# Patient Record
Sex: Female | Born: 1955 | Race: White | Hispanic: No | Marital: Married | Smoking: Never smoker
Health system: Southern US, Community
[De-identification: ages and names within clinical notes are randomized; demographics above are authoritative.]

## PROBLEM LIST (undated history)

## (undated) DIAGNOSIS — S46009A Unspecified injury of muscle(s) and tendon(s) of the rotator cuff of unspecified shoulder, initial encounter: Secondary | ICD-10-CM

## (undated) DIAGNOSIS — E039 Hypothyroidism, unspecified: Secondary | ICD-10-CM

## (undated) DIAGNOSIS — F329 Major depressive disorder, single episode, unspecified: Secondary | ICD-10-CM

## (undated) DIAGNOSIS — N8501 Benign endometrial hyperplasia: Secondary | ICD-10-CM

## (undated) DIAGNOSIS — E119 Type 2 diabetes mellitus without complications: Secondary | ICD-10-CM

## (undated) DIAGNOSIS — C541 Malignant neoplasm of endometrium: Secondary | ICD-10-CM

## (undated) DIAGNOSIS — M1990 Unspecified osteoarthritis, unspecified site: Secondary | ICD-10-CM

## (undated) DIAGNOSIS — B029 Zoster without complications: Secondary | ICD-10-CM

## (undated) DIAGNOSIS — E785 Hyperlipidemia, unspecified: Secondary | ICD-10-CM

## (undated) DIAGNOSIS — F32A Depression, unspecified: Secondary | ICD-10-CM

## (undated) DIAGNOSIS — F419 Anxiety disorder, unspecified: Secondary | ICD-10-CM

## (undated) DIAGNOSIS — R7611 Nonspecific reaction to tuberculin skin test without active tuberculosis: Secondary | ICD-10-CM

## (undated) DIAGNOSIS — Z8619 Personal history of other infectious and parasitic diseases: Secondary | ICD-10-CM

## (undated) HISTORY — DX: Zoster without complications: B02.9

## (undated) HISTORY — DX: Hyperlipidemia, unspecified: E78.5

## (undated) HISTORY — DX: Hypothyroidism, unspecified: E03.9

## (undated) HISTORY — DX: Type 2 diabetes mellitus without complications: E11.9

## (undated) HISTORY — DX: Unspecified osteoarthritis, unspecified site: M19.90

## (undated) HISTORY — PX: FRACTURE SURGERY: SHX138

## (undated) HISTORY — DX: Major depressive disorder, single episode, unspecified: F32.9

## (undated) HISTORY — DX: Personal history of other infectious and parasitic diseases: Z86.19

## (undated) HISTORY — DX: Malignant neoplasm of endometrium: C54.1

## (undated) HISTORY — PX: ABDOMINAL HYSTERECTOMY: SHX81

## (undated) HISTORY — DX: Depression, unspecified: F32.A

## (undated) HISTORY — PX: DILATION AND CURETTAGE OF UTERUS: SHX78

## (undated) HISTORY — DX: Benign endometrial hyperplasia: N85.01

## (undated) HISTORY — DX: Nonspecific reaction to tuberculin skin test without active tuberculosis: R76.11

---

## 1999-03-27 ENCOUNTER — Other Ambulatory Visit: Admission: RE | Admit: 1999-03-27 | Discharge: 1999-03-27 | Payer: Self-pay | Admitting: Family Medicine

## 2000-03-12 ENCOUNTER — Encounter: Payer: Self-pay | Admitting: Family Medicine

## 2000-03-12 ENCOUNTER — Encounter: Admission: RE | Admit: 2000-03-12 | Discharge: 2000-03-12 | Payer: Self-pay | Admitting: Family Medicine

## 2000-05-24 ENCOUNTER — Encounter: Payer: Self-pay | Admitting: Family Medicine

## 2000-05-24 ENCOUNTER — Encounter: Admission: RE | Admit: 2000-05-24 | Discharge: 2000-05-24 | Payer: Self-pay | Admitting: Family Medicine

## 2000-10-09 ENCOUNTER — Encounter: Admission: RE | Admit: 2000-10-09 | Discharge: 2001-01-07 | Payer: Self-pay | Admitting: Family Medicine

## 2001-12-12 ENCOUNTER — Encounter: Payer: Self-pay | Admitting: Family Medicine

## 2001-12-12 ENCOUNTER — Encounter: Admission: RE | Admit: 2001-12-12 | Discharge: 2001-12-12 | Payer: Self-pay | Admitting: Family Medicine

## 2003-04-13 ENCOUNTER — Encounter: Admission: RE | Admit: 2003-04-13 | Discharge: 2003-04-13 | Payer: Self-pay | Admitting: Family Medicine

## 2003-05-04 ENCOUNTER — Encounter (INDEPENDENT_AMBULATORY_CARE_PROVIDER_SITE_OTHER): Payer: Self-pay

## 2003-05-04 ENCOUNTER — Ambulatory Visit (HOSPITAL_COMMUNITY): Admission: RE | Admit: 2003-05-04 | Discharge: 2003-05-04 | Payer: Self-pay | Admitting: Obstetrics and Gynecology

## 2003-05-04 ENCOUNTER — Ambulatory Visit (HOSPITAL_BASED_OUTPATIENT_CLINIC_OR_DEPARTMENT_OTHER): Admission: RE | Admit: 2003-05-04 | Discharge: 2003-05-04 | Payer: Self-pay | Admitting: Obstetrics and Gynecology

## 2003-05-22 DIAGNOSIS — N8501 Benign endometrial hyperplasia: Secondary | ICD-10-CM

## 2003-05-22 HISTORY — DX: Benign endometrial hyperplasia: N85.01

## 2003-11-05 ENCOUNTER — Other Ambulatory Visit: Admission: RE | Admit: 2003-11-05 | Discharge: 2003-11-05 | Payer: Self-pay | Admitting: *Deleted

## 2004-04-17 ENCOUNTER — Encounter: Admission: RE | Admit: 2004-04-17 | Discharge: 2004-04-17 | Payer: Self-pay | Admitting: Family Medicine

## 2004-10-04 ENCOUNTER — Other Ambulatory Visit: Admission: RE | Admit: 2004-10-04 | Discharge: 2004-10-04 | Payer: Self-pay | Admitting: *Deleted

## 2005-05-04 ENCOUNTER — Encounter: Admission: RE | Admit: 2005-05-04 | Discharge: 2005-05-04 | Payer: Self-pay | Admitting: Family Medicine

## 2005-07-26 ENCOUNTER — Encounter: Admission: RE | Admit: 2005-07-26 | Discharge: 2005-10-24 | Payer: Self-pay | Admitting: Family Medicine

## 2005-10-04 ENCOUNTER — Other Ambulatory Visit: Admission: RE | Admit: 2005-10-04 | Discharge: 2005-10-04 | Payer: Self-pay | Admitting: Obstetrics & Gynecology

## 2006-03-27 ENCOUNTER — Encounter: Payer: Self-pay | Admitting: Cardiology

## 2006-05-07 ENCOUNTER — Encounter: Admission: RE | Admit: 2006-05-07 | Discharge: 2006-05-07 | Payer: Self-pay | Admitting: Family Medicine

## 2006-10-29 ENCOUNTER — Other Ambulatory Visit: Admission: RE | Admit: 2006-10-29 | Discharge: 2006-10-29 | Payer: Self-pay | Admitting: Obstetrics & Gynecology

## 2007-05-09 ENCOUNTER — Encounter: Admission: RE | Admit: 2007-05-09 | Discharge: 2007-05-09 | Payer: Self-pay | Admitting: Family Medicine

## 2007-10-30 ENCOUNTER — Other Ambulatory Visit: Admission: RE | Admit: 2007-10-30 | Discharge: 2007-10-30 | Payer: Self-pay | Admitting: Obstetrics & Gynecology

## 2008-05-10 ENCOUNTER — Encounter: Admission: RE | Admit: 2008-05-10 | Discharge: 2008-05-10 | Payer: Self-pay | Admitting: Obstetrics & Gynecology

## 2009-05-11 ENCOUNTER — Encounter: Admission: RE | Admit: 2009-05-11 | Discharge: 2009-05-11 | Payer: Self-pay | Admitting: Obstetrics & Gynecology

## 2009-09-16 ENCOUNTER — Encounter: Payer: Self-pay | Admitting: Cardiology

## 2009-10-11 ENCOUNTER — Ambulatory Visit (HOSPITAL_BASED_OUTPATIENT_CLINIC_OR_DEPARTMENT_OTHER): Admission: RE | Admit: 2009-10-11 | Discharge: 2009-10-11 | Payer: Self-pay | Admitting: Cardiology

## 2009-10-15 ENCOUNTER — Ambulatory Visit: Payer: Self-pay | Admitting: Internal Medicine

## 2010-05-16 ENCOUNTER — Encounter
Admission: RE | Admit: 2010-05-16 | Discharge: 2010-05-16 | Payer: Self-pay | Source: Home / Self Care | Attending: Obstetrics & Gynecology | Admitting: Obstetrics & Gynecology

## 2010-10-06 NOTE — Op Note (Signed)
NAME:  Cheryl Chan, Cheryl Chan NO.:  [id number]   MEDICAL RECORD NO.:  [id number]                   PATIENT TYPE:  AMB   LOCATION:  [facility]                                 FACILITY:  [facility]   PHYSICIAN:  Laqueta Linden, M.D.                 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   PROCEDURE:  Diagnostic hysteroscopy with curettage.   SURGEON:  Laqueta Linden, M.D.   ANESTHESIA:  MAC sedation with paracervical block.   ESTIMATED BLOOD LOSS:  Less than 10 mL.   SORBITOL NET INTAKE:  Less than 100 mL.   COMPLICATIONS:  None.   COMPLICATIONS:  None.   INDICATIONS FOR PROCEDURE:  Cheryl Chan is a 55 year old, gravida 4, para 4  female who has undergone 4 cesarean sections who had menses well controlled  on Micronor discontinued in early 2004.  She recently experienced prolonged  heavy bleeding which decreased on progesterone and then resumed.  Hemoglobin  was as low as 10.9.  Endometrial sampling was performed and revealed focal  complex endometrial hyperplasia without atypia but cannot rule out  adenocarcinoma. She subsequently underwent treatment with Megace 40 mg  b.i.d. for two weeks and experienced a withdrawal bleed and presents now for  outpatient hysteroscopic evaluation with curettage to more thoroughly  evaluate her endometrium and rule out a focus of adenocarcinoma.  She has  seen the informed consent film, voiced her understanding and acceptance of  all risks including the fact that this is intended to be a diagnostic and  not therapeutic procedure and agrees to proceed.   DESCRIPTION OF PROCEDURE:  The patient was taken to the operating room after  receiving 30 mg of IV Toradol.  She was  placed on the operating table in the  supine position and after proper identification and consents were  ascertained, MAC sedation was then introduced. She was placed in the [place]  stirrups and the perineum and vagina were prepped and draped in routine  sterile fashion. The bladder was emptied with a catheter.  Bimanual  examination confirmed a normal size uterus which was high in the pelvis and  anterior and not mobile.  A speculum was placed in the vagina and the  anterior lip of the cervix grasped with a single tooth tenaculum.  Of note,  there was no cervical descent noted.  The endometrial cavity sounded to 10  cm.  The internal os was gently dilated to a number 21 Pratt dilator without  difficulty after placement of a paracervical block utilizing 10 mL of 1%  plain Xylocaine circumferentially.  The diagnostic scope was then inserted.  Both tubal ostia  were visualized. There was a tiny polyp at the area of the  left tubal ostia. The remainder of the endometrial cavity appeared quite  clean and even atrophic appearing.  Of note, the patient had stopped  bleeding or was only spotting at the time of the procedure having undergone  some heavier bleeding after the Megace the week prior to the procedure.  There were no focal lesions identified.  Photographs were taken.  The scope  was withdrawn.  Sharp curettage productive of a minimal amount of tissue was  then performed. This was sent to pathology, attention Marcie Bal, M.D.  to review in view of the patient's prior history.  All instruments were then  removed.  Net sorbitol intake was negligible.  Estimated blood loss  negligible.  Tenaculum site was hemostatic. The patient was stable and awake  on transfer to the recovery room.  She will be observed and discharged per  anesthesia protocol. She received an additional 30 mg of Toradol IM while  she was sedated in the operating room.  She will be discharged home when  appropriate with  routine written and verbal discharge instructions. She is  to followup in the office in two weeks time or sooner for excessive pain,  fever, bleeding or any other concerns.  Pathology will be reviewed with long-  term management plan addressed when results are available.  She is to take  Advil or Aleve as needed for any cramping and to call for excessive  cramping, fever, bleeding or other concerns.                                               Laqueta Linden, M.D.    LKS/MEDQ  D:  05/04/2003  T:  05/04/2003  Job:  [id number]   cc:   Duncan Dull, M.D.  [address]  [place]  [place] [address]  Fax: [phone]

## 2010-10-20 LAB — HM PAP SMEAR: HM Pap smear: NORMAL

## 2010-12-27 ENCOUNTER — Ambulatory Visit: Payer: Self-pay | Admitting: Family Medicine

## 2011-04-27 ENCOUNTER — Other Ambulatory Visit: Payer: Self-pay | Admitting: Obstetrics & Gynecology

## 2011-04-27 DIAGNOSIS — Z1231 Encounter for screening mammogram for malignant neoplasm of breast: Secondary | ICD-10-CM

## 2011-06-01 ENCOUNTER — Ambulatory Visit
Admission: RE | Admit: 2011-06-01 | Discharge: 2011-06-01 | Disposition: A | Discharge status: Home / Self Care | Payer: 59 | Source: Ambulatory Visit | Attending: Obstetrics & Gynecology | Admitting: Obstetrics & Gynecology

## 2011-06-01 DIAGNOSIS — Z1231 Encounter for screening mammogram for malignant neoplasm of breast: Secondary | ICD-10-CM

## 2011-08-20 LAB — HM DIABETES FOOT EXAM: HM Diabetic Foot Exam: NORMAL

## 2011-08-29 ENCOUNTER — Encounter: Payer: Self-pay | Admitting: *Deleted

## 2011-08-30 ENCOUNTER — Telehealth: Payer: Self-pay | Admitting: Internal Medicine

## 2011-08-30 NOTE — Telephone Encounter (Signed)
Message copied by Etheleen Sia on Thu Aug 30, 2011  2:47 PM ------      Message from: Rene Paci A      Created: Wed Aug 22, 2011 12:20 PM      Regarding: RE: NEW PT REQUEST IN SOONER APPT       Yes - but try to avoid more than 3 new pts in one day (esp Mon or Wed!)      ----- Message -----         From: Etheleen Sia         Sent: 08/22/2011   9:27 AM           To: Newt Lukes, MD      Subject: NEW PT REQUEST IN SOONER APPT                            Cheryl Chan has a new pt appt on May 9. She has a conflict with her daughter's schedule.  She also wants to come sooner due to problems with her blood sugar.  Is it ok to work in prior to May 9?

## 2011-09-10 ENCOUNTER — Ambulatory Visit (INDEPENDENT_AMBULATORY_CARE_PROVIDER_SITE_OTHER): Payer: 59 | Admitting: Internal Medicine

## 2011-09-10 ENCOUNTER — Other Ambulatory Visit (INDEPENDENT_AMBULATORY_CARE_PROVIDER_SITE_OTHER): Payer: 59

## 2011-09-10 ENCOUNTER — Encounter: Payer: Self-pay | Admitting: Internal Medicine

## 2011-09-10 VITALS — BP 100/62 | HR 72 | Temp 98.4°F | Ht 62.0 in | Wt 147.0 lb

## 2011-09-10 DIAGNOSIS — E039 Hypothyroidism, unspecified: Secondary | ICD-10-CM | POA: Insufficient documentation

## 2011-09-10 DIAGNOSIS — F329 Major depressive disorder, single episode, unspecified: Secondary | ICD-10-CM | POA: Insufficient documentation

## 2011-09-10 DIAGNOSIS — F32A Depression, unspecified: Secondary | ICD-10-CM | POA: Insufficient documentation

## 2011-09-10 DIAGNOSIS — H6120 Impacted cerumen, unspecified ear: Secondary | ICD-10-CM

## 2011-09-10 DIAGNOSIS — E119 Type 2 diabetes mellitus without complications: Secondary | ICD-10-CM

## 2011-09-10 DIAGNOSIS — H6123 Impacted cerumen, bilateral: Secondary | ICD-10-CM

## 2011-09-10 DIAGNOSIS — E785 Hyperlipidemia, unspecified: Secondary | ICD-10-CM

## 2011-09-10 NOTE — Assessment & Plan Note (Signed)
Hx gestational DM Previously on metformin - none since 2010 Check a1c and monitor Encouraged continued weight control, diet and exercsie No results found for this basename: HGBA1C

## 2011-09-10 NOTE — Patient Instructions (Signed)
It was good to see you today. We have reviewed your prior records including labs and tests today Medications reviewed, no changes at this time. Call as refills are needed Test(s) ordered today. Your results will be called to you after review (48-72hours after test completion). If any changes need to be made, you will be notified at that time. Your ears have been irrigated of wax today -let us know if continued hearing problems persist for referral to audiologist and hearing testing Please schedule followup in 6 months, call sooner if problems.

## 2011-09-10 NOTE — Assessment & Plan Note (Signed)
On Armour Check TSH now and adjust dose if needed The current medical regimen is effective;  continue present plan and medications. No results found for this basename: TSH

## 2011-09-10 NOTE — Assessment & Plan Note (Signed)
Manifest as "pseudodementia" - aw neuro (love) for sme Started on sertraline fall 2012 and much improved - continue same

## 2011-09-10 NOTE — Assessment & Plan Note (Signed)
Intolerant of statins - previous trials of Crestor, lipitor, Vytorin all caused extreme myalgia, fatigue and memory problems controls with diet/exercise and weight control Check annually to monitor same  

## 2011-09-10 NOTE — Progress Notes (Signed)
Subjective:    Patient ID: Cheryl Chan, female    DOB: 02/26/1956, 56 y.o.   MRN: [id number]  HPI  New to me, here to establish PCP Reviewed chronic medical issues:  DM2, diet controlled since 2010 - hx gestational DM requiring insulin x 2 preg, stopped metformin 2010 due to a1c <6. Follows careful diet and exercise program with close attention to weight  Dyslipidemia - intolerant of statins - has tried several in past - also intol of WelChol - follows careful diet/exercise program to control weight to manage same  Hypothyroid - prefers Armour - the patient reports compliance with medication(s) as prescribed. Denies adverse side effects.  Past Medical History  Diagnosis Date  . Diet-controlled type 2 diabetes mellitus   . Hypothyroidism   . Hyperlipidemia   . Arthritis   . 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 History of chicken pox   . 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 Heart disease Mother   . Cancer Father     prostrate  . Hypertension Father   . Arthritis Father    History  Substance Use Topics  . Smoking status: Never Smoker   . Smokeless tobacco: Not on file  . Alcohol Use: 1.2 oz/week    2 Glasses of wine per week     Review of Systems Constitutional: Negative for fever or unexpected weight change.  Respiratory: Negative for cough and shortness of breath.   Cardiovascular: Negative for chest pain or palpitations.  Gastrointestinal: Negative for abdominal pain, no bowel changes.  Musculoskeletal: Negative for gait problem or joint swelling.  Skin: Negative for rash.  Neurological: Negative for dizziness or headache.  No other specific complaints in a complete review of systems (except as listed in HPI above).     Objective:   Physical Exam BP 100/62  Pulse 72  Temp(Src) 98.4 F (36.9 C) (Oral)  Ht 5\' 2"  (1.575 m)  Wt 147 lb (66.679 kg)  BMI 26.89 kg/m2  SpO2 97% Wt Readings from Last 3  Encounters:  09/10/11 147 lb (66.679 kg)   Constitutional: She appears well-developed and well-nourished. No distress.  HENT: Head: Normocephalic and atraumatic. Ears: B cerumen impaction - after irrigation, B TMs ok, no erythema or effusion; Nose: Nose normal. Mouth/Throat: Oropharynx is clear and moist. No oropharyngeal exudate.  Eyes: Conjunctivae and EOM are normal. Pupils are equal, round, and reactive to light. No scleral icterus.  Neck: Normal range of motion. Neck supple. No JVD present. No thyromegaly present.  Cardiovascular: Normal rate, regular rhythm and normal heart sounds.  No murmur heard. No BLE edema. Pulmonary/Chest: Effort normal and breath sounds normal. No respiratory distress. She has no wheezes. Musculoskeletal: Normal range of motion, no joint effusions. No gross deformities Neurological: She is alert and oriented to person, place, and time. No cranial nerve deficit. Coordination normal.  Skin: Skin is warm and dry. No rash noted. No erythema.  Psychiatric: She has a normal mood and affect. Her behavior is normal. Judgment and thought content normal.   No results found for this basename: WBC,  HGB,  HCT,  PLT,  GLUCOSE,  CHOL,  TRIG,  HDL,  LDLDIRECT,  LDLCALC,  ALT,  AST,  NA,  K,  CL,  CREATININE,  BUN,  CO2,  TSH,  PSA,  INR,  GLUF,  HGBA1C,  MICROALBUR      Assessment & Plan:  See problem list. Medications and labs reviewed today.  B cerumen impaction  -  Procedure: wax removal, bilateral Reason: wax impaction Risks and benefits of procedure discussed with the patient who agrees to proceed. Ear(s) irrigated with warm water. Large amount of wax removed. Instrumentation with metal ear loop was performed to accomplish wax removal. the patient tolerated procedure well.

## 2011-09-11 ENCOUNTER — Other Ambulatory Visit: Payer: Self-pay | Admitting: *Deleted

## 2011-09-11 LAB — BASIC METABOLIC PANEL
BUN: 28 mg/dL — ABNORMAL HIGH (ref 6–23)
Calcium: 8.9 mg/dL (ref 8.4–10.5)
GFR: 102.28 mL/min (ref >60.00)
Glucose, Bld: 98 mg/dL (ref 70–99)
Sodium: 139 mEq/L (ref 135–145)

## 2011-09-11 LAB — LIPID PANEL
Cholesterol: 219 mg/dL — ABNORMAL HIGH (ref 0–200)
HDL: 54 mg/dL (ref >39.00)
Total CHOL/HDL Ratio: 4
Triglycerides: 154 mg/dL — ABNORMAL HIGH (ref 0.0–149.0)
VLDL: 30.8 mg/dL (ref 0.0–40.0)

## 2011-09-11 LAB — MICROALBUMIN / CREATININE URINE RATIO
Creatinine,U: 58.7 mg/dL
Microalb Creat Ratio: 0.3 mg/g (ref 0.0–30.0)
Microalb, Ur: 0.2 mg/dL (ref 0.0–1.9)

## 2011-09-11 LAB — LDL CHOLESTEROL, DIRECT: Direct LDL: 146.7 mg/dL

## 2011-09-11 LAB — TSH: TSH: 1.89 u[IU]/mL (ref 0.35–5.50)

## 2011-09-11 LAB — T4, FREE: Free T4: 0.52 ng/dL — ABNORMAL LOW (ref 0.60–1.60)

## 2011-09-11 MED ORDER — THYROID 90 MG PO TABS: 90.0000 mg | ORAL_TABLET | Freq: Every day | ORAL | Status: DC

## 2011-09-27 ENCOUNTER — Ambulatory Visit: Payer: Self-pay | Admitting: Internal Medicine

## 2011-11-12 ENCOUNTER — Telehealth: Payer: Self-pay | Admitting: Internal Medicine

## 2011-11-12 DIAGNOSIS — C55 Malignant neoplasm of uterus, part unspecified: Secondary | ICD-10-CM

## 2011-11-12 DIAGNOSIS — Z8542 Personal history of malignant neoplasm of other parts of uterus: Secondary | ICD-10-CM | POA: Insufficient documentation

## 2011-11-12 NOTE — Telephone Encounter (Signed)
Caller: Denisia/Patient; PCP: Rene Paci; CB#: [phone]; ; ; Call regarding Other; Patient has recently been diagnosed with cancer and is wanting to speak with Dr. Felicity Coyer regarding treatment plan and need to possibly increase dosage of antidepressant medication. Patient is available at the above number outside of business hours at any time.

## 2011-11-12 NOTE — Telephone Encounter (Signed)
Reviewed new dx uterine ca and pending eval ay [facility]  Months of increasing depression symptoms, exac by situational stress - increase sertraline now - pt will call when new rx needed - verified no si/hi - support offered

## 2011-11-15 ENCOUNTER — Other Ambulatory Visit: Payer: Self-pay | Admitting: *Deleted

## 2011-11-15 MED ORDER — SERTRALINE HCL 100 MG PO TABS: 100.0000 mg | ORAL_TABLET | Freq: Every day | ORAL | Status: DC

## 2011-11-15 NOTE — Telephone Encounter (Signed)
MD received fax stating pt currently taking sertraline 50 mg daily, as rx by Dr. Sandria Manly. She is requesting an increase to 100 mg, also want a 3 months supply. Pr md ok to increase. Sending new rx to [place] pharmacy for 3months

## 2011-11-19 LAB — HM PAP SMEAR

## 2011-11-21 ENCOUNTER — Encounter (HOSPITAL_COMMUNITY): Payer: Self-pay | Admitting: Pharmacy Technician

## 2011-11-30 ENCOUNTER — Encounter (HOSPITAL_COMMUNITY): Payer: Self-pay

## 2011-11-30 ENCOUNTER — Ambulatory Visit (HOSPITAL_COMMUNITY)
Admission: RE | Admit: 2011-11-30 | Discharge: 2011-11-30 | Disposition: A | Discharge status: Home / Self Care | Payer: 59 | Source: Ambulatory Visit | Attending: Gynecologic Oncology | Admitting: Gynecologic Oncology

## 2011-11-30 ENCOUNTER — Other Ambulatory Visit: Payer: Self-pay

## 2011-11-30 ENCOUNTER — Encounter (HOSPITAL_COMMUNITY)
Admission: RE | Admit: 2011-11-30 | Discharge: 2011-11-30 | Disposition: A | Discharge status: Home / Self Care | Payer: 59 | Source: Ambulatory Visit | Attending: Gynecologic Oncology | Admitting: Gynecologic Oncology

## 2011-11-30 DIAGNOSIS — Z01810 Encounter for preprocedural cardiovascular examination: Secondary | ICD-10-CM | POA: Insufficient documentation

## 2011-11-30 DIAGNOSIS — Z01812 Encounter for preprocedural laboratory examination: Secondary | ICD-10-CM | POA: Insufficient documentation

## 2011-11-30 DIAGNOSIS — E119 Type 2 diabetes mellitus without complications: Secondary | ICD-10-CM | POA: Insufficient documentation

## 2011-11-30 DIAGNOSIS — I498 Other specified cardiac arrhythmias: Secondary | ICD-10-CM | POA: Insufficient documentation

## 2011-11-30 DIAGNOSIS — C549 Malignant neoplasm of corpus uteri, unspecified: Secondary | ICD-10-CM | POA: Insufficient documentation

## 2011-11-30 DIAGNOSIS — E785 Hyperlipidemia, unspecified: Secondary | ICD-10-CM | POA: Insufficient documentation

## 2011-11-30 DIAGNOSIS — Z01818 Encounter for other preprocedural examination: Secondary | ICD-10-CM | POA: Insufficient documentation

## 2011-11-30 DIAGNOSIS — R011 Cardiac murmur, unspecified: Secondary | ICD-10-CM | POA: Insufficient documentation

## 2011-11-30 LAB — DIFFERENTIAL
Basophils Absolute: 0 10*3/uL (ref 0.0–0.1)
Eosinophils Absolute: 0.1 10*3/uL (ref 0.0–0.7)
Eosinophils Relative: 3 % (ref 0–5)
Lymphs Abs: 1.9 10*3/uL (ref 0.7–4.0)
Monocytes Absolute: 0.5 10*3/uL (ref 0.1–1.0)

## 2011-11-30 LAB — COMPREHENSIVE METABOLIC PANEL
ALT: 13 U/L (ref 0–35)
Alkaline Phosphatase: 71 U/L (ref 39–117)
CO2: 31 mEq/L (ref 19–32)
GFR calc Af Amer: >90 mL/min (ref >90)
GFR calc non Af Amer: >90 mL/min (ref >90)
Glucose, Bld: 112 mg/dL — ABNORMAL HIGH (ref 70–99)
Potassium: 4.5 mEq/L (ref 3.5–5.1)
Sodium: 137 mEq/L (ref 135–145)

## 2011-11-30 LAB — CBC
Hemoglobin: 12.6 g/dL (ref 12.0–15.0)
RBC: 4.26 MIL/uL (ref 3.87–5.11)
WBC: 4.7 10*3/uL (ref 4.0–10.5)

## 2011-11-30 LAB — SURGICAL PCR SCREEN: Staphylococcus aureus: POSITIVE — AB

## 2011-11-30 NOTE — Patient Instructions (Signed)
20 Cheryl Chan  11/30/2011   Your procedure is scheduled on:  12/04/11   Tuesday   Surgery  [id number]  Report to [facility] at   0515    AM.  Call this number if you have problems the morning of surgery: [phone]     Or PST   [phone]  [facility]   Remember: CALL OFFICE EARLY Monday MORNING REGARDING BOWEL PREP  Do not eat food:After Midnight.  Sunday NIGHT  OR AS DIRECTED BY OFFICE  May have clear liquids: all day Monday  Until midnight Monday night then none  Clear liquids include soda, tea, black coffee, apple or grape juice, broth.  Take these medicines the morning of surgery with A SIP OF WATER:SERTALINE, ARMOUR THYROID   Do not wear jewelry, make-up or nail polish.  Do not wear lotions, powders, or perfumes. You may wear deodorant.  Do not shave 48 hours prior to surgery.  Do not bring valuables to the hospital.  Contacts, dentures or bridgework may not be worn into surgery.  Leave suitcase in the car. After surgery it may be brought to your room.  For patients admitted to the hospital, checkout time is 11:00 AM the day of discharge.   Patients discharged the day of surgery will not be allowed to drive home.  Name and phone number of your driver:    husband                                                                  Special Instructions: CHG Shower Use Special Wash: 1/2 bottle night before surgery and 1/2 bottle morning of surgery. REGULAR SOAP FACE AND PRIVATES              LADIES- NO SHAVING 48 HOURS BEFORE USING BETASEPT SOAP.               Please read over the following fact sheets that you were given: MRSA Information

## 2011-12-03 MED ORDER — DEXTROSE 5 % IV SOLN
2.0000 g | INTRAVENOUS | Status: AC
  Administered 2011-12-04: 2 g via INTRAVENOUS
  Filled 2011-12-03 (×2): qty 2

## 2011-12-04 ENCOUNTER — Encounter (HOSPITAL_COMMUNITY): Payer: Self-pay | Admitting: Certified Registered Nurse Anesthetist

## 2011-12-04 ENCOUNTER — Encounter (HOSPITAL_COMMUNITY): Payer: Self-pay | Admitting: *Deleted

## 2011-12-04 ENCOUNTER — Encounter (HOSPITAL_COMMUNITY)
Admission: RE | Disposition: A | Discharge status: Home / Self Care | Payer: Self-pay | Source: Ambulatory Visit | Attending: Obstetrics & Gynecology

## 2011-12-04 ENCOUNTER — Ambulatory Visit (HOSPITAL_COMMUNITY): Payer: 59 | Admitting: Certified Registered Nurse Anesthetist

## 2011-12-04 ENCOUNTER — Ambulatory Visit (HOSPITAL_COMMUNITY)
Admission: RE | Admit: 2011-12-04 | Discharge: 2011-12-05 | Disposition: A | Discharge status: Home / Self Care | Payer: 59 | Source: Ambulatory Visit | Attending: Obstetrics & Gynecology | Admitting: Obstetrics & Gynecology

## 2011-12-04 DIAGNOSIS — C55 Malignant neoplasm of uterus, part unspecified: Secondary | ICD-10-CM

## 2011-12-04 DIAGNOSIS — Z8542 Personal history of malignant neoplasm of other parts of uterus: Secondary | ICD-10-CM | POA: Diagnosis present

## 2011-12-04 DIAGNOSIS — E785 Hyperlipidemia, unspecified: Secondary | ICD-10-CM | POA: Insufficient documentation

## 2011-12-04 DIAGNOSIS — E039 Hypothyroidism, unspecified: Secondary | ICD-10-CM | POA: Insufficient documentation

## 2011-12-04 DIAGNOSIS — E119 Type 2 diabetes mellitus without complications: Secondary | ICD-10-CM | POA: Insufficient documentation

## 2011-12-04 DIAGNOSIS — C549 Malignant neoplasm of corpus uteri, unspecified: Secondary | ICD-10-CM | POA: Insufficient documentation

## 2011-12-04 HISTORY — PX: ROBOTIC ASSISTED LAP VAGINAL HYSTERECTOMY: SHX2362

## 2011-12-04 LAB — GLUCOSE, CAPILLARY: Glucose-Capillary: 115 mg/dL — ABNORMAL HIGH (ref 70–99)

## 2011-12-04 LAB — TYPE AND SCREEN: ABO/RH(D): A POS

## 2011-12-04 SURGERY — ROBOTIC ASSISTED LAPAROSCOPIC VAGINAL HYSTERECTOMY
Anesthesia: General | Wound class: Clean Contaminated

## 2011-12-04 MED ORDER — SERTRALINE HCL 100 MG PO TABS
100.0000 mg | ORAL_TABLET | Freq: Every morning | ORAL | Status: DC
  Administered 2011-12-05: 100 mg via ORAL
  Filled 2011-12-04: qty 1

## 2011-12-04 MED ORDER — FLUTICASONE PROPIONATE 50 MCG/ACT NA SUSP
2.0000 | Freq: Every day | NASAL | Status: DC | PRN
  Filled 2011-12-04: qty 16

## 2011-12-04 MED ORDER — KETOROLAC TROMETHAMINE 30 MG/ML IJ SOLN
30.0000 mg | Freq: Four times a day (QID) | INTRAMUSCULAR | Status: AC
  Filled 2011-12-04: qty 1

## 2011-12-04 MED ORDER — STERILE WATER FOR IRRIGATION IR SOLN
Status: DC | PRN
  Administered 2011-12-04: 3000 mL

## 2011-12-04 MED ORDER — LACTATED RINGERS IR SOLN
Status: DC | PRN
  Administered 2011-12-04: 1000 mL

## 2011-12-04 MED ORDER — ROCURONIUM BROMIDE 100 MG/10ML IV SOLN
INTRAVENOUS | Status: DC | PRN
  Administered 2011-12-04: 5 mg via INTRAVENOUS
  Administered 2011-12-04: 50 mg via INTRAVENOUS

## 2011-12-04 MED ORDER — ONDANSETRON HCL 4 MG/2ML IJ SOLN: 4.0000 mg | Freq: Four times a day (QID) | INTRAMUSCULAR | Status: DC | PRN

## 2011-12-04 MED ORDER — FENTANYL CITRATE 0.05 MG/ML IJ SOLN
INTRAMUSCULAR | Status: DC | PRN
  Administered 2011-12-04: 100 ug via INTRAVENOUS
  Administered 2011-12-04 (×2): 50 ug via INTRAVENOUS

## 2011-12-04 MED ORDER — MIDAZOLAM HCL 5 MG/5ML IJ SOLN
INTRAMUSCULAR | Status: DC | PRN
  Administered 2011-12-04: 1 mg via INTRAVENOUS

## 2011-12-04 MED ORDER — LACTATED RINGERS IV SOLN
INTRAVENOUS | Status: DC | PRN
  Administered 2011-12-04 (×2): via INTRAVENOUS

## 2011-12-04 MED ORDER — ACETAMINOPHEN 10 MG/ML IV SOLN
INTRAVENOUS | Status: DC | PRN
  Administered 2011-12-04: 1000 mg via INTRAVENOUS

## 2011-12-04 MED ORDER — GLYCOPYRROLATE 0.2 MG/ML IJ SOLN
INTRAMUSCULAR | Status: DC | PRN
  Administered 2011-12-04: 0.6 mg via INTRAVENOUS

## 2011-12-04 MED ORDER — LIDOCAINE HCL (CARDIAC) 20 MG/ML IV SOLN
INTRAVENOUS | Status: DC | PRN
  Administered 2011-12-04: 80 mg via INTRAVENOUS

## 2011-12-04 MED ORDER — KETOROLAC TROMETHAMINE 30 MG/ML IJ SOLN
30.0000 mg | Freq: Four times a day (QID) | INTRAMUSCULAR | Status: AC
  Administered 2011-12-04 – 2011-12-05 (×3): 30 mg via INTRAVENOUS
  Filled 2011-12-04 (×2): qty 1

## 2011-12-04 MED ORDER — THYROID 60 MG PO TABS
90.0000 mg | ORAL_TABLET | Freq: Every day | ORAL | Status: DC
Start: 2011-12-05 — End: 2011-12-05
  Administered 2011-12-05: 90 mg via ORAL
  Filled 2011-12-04: qty 1

## 2011-12-04 MED ORDER — LACTATED RINGERS IV SOLN: INTRAVENOUS | Status: DC

## 2011-12-04 MED ORDER — ONDANSETRON HCL 4 MG PO TABS: 4.0000 mg | ORAL_TABLET | Freq: Four times a day (QID) | ORAL | Status: DC | PRN

## 2011-12-04 MED ORDER — ONDANSETRON HCL 4 MG/2ML IJ SOLN
INTRAMUSCULAR | Status: DC | PRN
  Administered 2011-12-04: 4 mg via INTRAVENOUS

## 2011-12-04 MED ORDER — PROPOFOL 10 MG/ML IV EMUL
INTRAVENOUS | Status: DC | PRN
  Administered 2011-12-04: 160 mg via INTRAVENOUS

## 2011-12-04 MED ORDER — HYDROMORPHONE HCL PF 1 MG/ML IJ SOLN
INTRAMUSCULAR | Status: AC
  Filled 2011-12-04: qty 1

## 2011-12-04 MED ORDER — KCL IN DEXTROSE-NACL 20-5-0.45 MEQ/L-%-% IV SOLN
INTRAVENOUS | Status: DC
  Administered 2011-12-04 – 2011-12-05 (×3): via INTRAVENOUS
  Filled 2011-12-04 (×5): qty 1000

## 2011-12-04 MED ORDER — NEOSTIGMINE METHYLSULFATE 1 MG/ML IJ SOLN
INTRAMUSCULAR | Status: DC | PRN
  Administered 2011-12-04: 4 mg via INTRAVENOUS

## 2011-12-04 MED ORDER — HYDROMORPHONE HCL PF 1 MG/ML IJ SOLN
0.2500 mg | INTRAMUSCULAR | Status: DC | PRN
  Administered 2011-12-04 (×2): 0.5 mg via INTRAVENOUS

## 2011-12-04 MED ORDER — PROMETHAZINE HCL 25 MG/ML IJ SOLN: 6.2500 mg | INTRAMUSCULAR | Status: DC | PRN

## 2011-12-04 MED ORDER — DEXAMETHASONE SODIUM PHOSPHATE 10 MG/ML IJ SOLN
INTRAMUSCULAR | Status: DC | PRN
  Administered 2011-12-04: 10 mg via INTRAVENOUS

## 2011-12-04 MED ORDER — OXYCODONE-ACETAMINOPHEN 5-325 MG PO TABS
1.0000 | ORAL_TABLET | ORAL | Status: DC | PRN
  Administered 2011-12-05: 1 via ORAL
  Filled 2011-12-04: qty 1

## 2011-12-04 MED ORDER — MORPHINE SULFATE 2 MG/ML IJ SOLN: 2.0000 mg | INTRAMUSCULAR | Status: DC | PRN

## 2011-12-04 MED ORDER — ACETAMINOPHEN 10 MG/ML IV SOLN
INTRAVENOUS | Status: AC
  Filled 2011-12-04: qty 100

## 2011-12-04 MED ORDER — ZOLPIDEM TARTRATE 5 MG PO TABS: 5.0000 mg | ORAL_TABLET | Freq: Every evening | ORAL | Status: DC | PRN

## 2011-12-04 MED ORDER — HEPARIN SODIUM (PORCINE) 1000 UNIT/ML IJ SOLN
INTRAMUSCULAR | Status: AC
  Filled 2011-12-04: qty 1

## 2011-12-04 SURGICAL SUPPLY — 52 items
APL SKNCLS STERI-STRIP NONHPOA (GAUZE/BANDAGES/DRESSINGS)
BAG SPEC RTRVL LRG 6X4 10 (ENDOMECHANICALS) ×1
BENZOIN TINCTURE PRP APPL 2/3 (GAUZE/BANDAGES/DRESSINGS) ×1 IMPLANT
CHLORAPREP W/TINT 26ML (MISCELLANEOUS) ×2 IMPLANT
CLOTH BEACON ORANGE TIMEOUT ST (SAFETY) ×2 IMPLANT
CORD HIGH FREQUENCY UNIPOLAR (ELECTROSURGICAL) ×1 IMPLANT
CORDS BIPOLAR (ELECTRODE) ×2 IMPLANT
COVER MAYO STAND STRL (DRAPES) ×1 IMPLANT
COVER SURGICAL LIGHT HANDLE (MISCELLANEOUS) ×2 IMPLANT
COVER TIP SHEARS 8 DVNC (MISCELLANEOUS) ×1 IMPLANT
COVER TIP SHEARS 8MM DA VINCI (MISCELLANEOUS) ×1
DECANTER SPIKE VIAL GLASS SM (MISCELLANEOUS) ×1 IMPLANT
DRAPE LG THREE QUARTER DISP (DRAPES) ×2 IMPLANT
DRAPE SURG IRRIG POUCH 19X23 (DRAPES) ×2 IMPLANT
DRAPE TABLE BACK 44X90 PK DISP (DRAPES) ×4 IMPLANT
DRAPE UTILITY XL STRL (DRAPES) ×2 IMPLANT
DRAPE WARM FLUID 44X44 (DRAPE) ×1 IMPLANT
DRSG TEGADERM 6X8 (GAUZE/BANDAGES/DRESSINGS) ×4 IMPLANT
ELECT REM PT RETURN 9FT ADLT (ELECTROSURGICAL) ×2
ELECTRODE REM PT RTRN 9FT ADLT (ELECTROSURGICAL) ×1 IMPLANT
GAUZE VASELINE 3X9 (GAUZE/BANDAGES/DRESSINGS) IMPLANT
GLOVE BIO SURGEON STRL SZ 6.5 (GLOVE) ×8 IMPLANT
GLOVE BIO SURGEON STRL SZ7.5 (GLOVE) ×4 IMPLANT
GLOVE BIOGEL PI IND STRL 7.0 (GLOVE) ×2 IMPLANT
GLOVE BIOGEL PI INDICATOR 7.0 (GLOVE) ×2
GOWN PREVENTION PLUS XLARGE (GOWN DISPOSABLE) ×10 IMPLANT
HOLDER FOLEY CATH W/STRAP (MISCELLANEOUS) ×1 IMPLANT
KIT ACCESSORY DA VINCI DISP (KITS) ×1
KIT ACCESSORY DVNC DISP (KITS) ×1 IMPLANT
MANIPULATOR UTERINE 4.5 ZUMI (MISCELLANEOUS) ×2 IMPLANT
OCCLUDER COLPOPNEUMO (BALLOONS) ×2 IMPLANT
PACK LAPAROSCOPY W LONG (CUSTOM PROCEDURE TRAY) ×2 IMPLANT
POUCH SPECIMEN RETRIEVAL 10MM (ENDOMECHANICALS) ×3 IMPLANT
SET TUBE IRRIG SUCTION NO TIP (IRRIGATION / IRRIGATOR) ×2 IMPLANT
SHEET LAVH (DRAPES) ×1 IMPLANT
SOLUTION ELECTROLUBE (MISCELLANEOUS) ×2 IMPLANT
SPONGE LAP 18X18 X RAY DECT (DISPOSABLE) IMPLANT
STRIP CLOSURE SKIN 1/2X4 (GAUZE/BANDAGES/DRESSINGS) ×2 IMPLANT
SUT VIC AB 0 CT1 27 (SUTURE) ×4
SUT VIC AB 0 CT1 27XBRD ANTBC (SUTURE) ×3 IMPLANT
SUT VIC AB 4-0 PS2 27 (SUTURE) ×4 IMPLANT
SUT VICRYL 0 UR6 27IN ABS (SUTURE) ×3 IMPLANT
SYR 50ML LL SCALE MARK (SYRINGE) ×1 IMPLANT
SYR BULB IRRIGATION 50ML (SYRINGE) IMPLANT
TOWEL OR 17X26 10 PK STRL BLUE (TOWEL DISPOSABLE) ×1 IMPLANT
TRAP SPECIMEN MUCOUS 40CC (MISCELLANEOUS) ×1 IMPLANT
TRAY FOLEY CATH 14FRSI W/METER (CATHETERS) ×1 IMPLANT
TROCAR 12M 150ML BLUNT (TROCAR) ×1 IMPLANT
TROCAR BLADELESS OPT 5 75 (ENDOMECHANICALS) ×1 IMPLANT
TROCAR XCEL 12X100 BLDLESS (ENDOMECHANICALS) ×1 IMPLANT
TUBING FILTER THERMOFLATOR (ELECTROSURGICAL) ×1 IMPLANT
WATER STERILE IRR 1500ML POUR (IV SOLUTION) ×4 IMPLANT

## 2011-12-04 NOTE — Interval H&P Note (Signed)
History and Physical Interval Note:  12/04/2011 6:36 AM  Cheryl Chan  has presented today for surgery, with the diagnosis of endometrial cancer  The various methods of treatment have been discussed with the patient and family. After consideration of risks, benefits and other options for treatment, the patient has consented to  Procedure(s) (LRB): ROBOTIC ASSISTED LAPAROSCOPIC VAGINAL HYSTERECTOMY (N/A) as a surgical intervention .  The patient's history has been reviewed, patient examined, no change in status, stable for surgery.  I have reviewed the patients' chart and labs.  Questions were answered to the patient's satisfaction.     [name] A.

## 2011-12-04 NOTE — H&P (Signed)
H&P  Cheryl Chan 56 y.o. female  CC: No chief complaint on file.  CHIEF COMPLAINT:  Newly diagnosed grade I endometrial cancer. OF PRESENT ILLNESS:  Ms. Cheryl Chan is a very pleasant year-old para 4-0-1-4 who is seen for a diagnosed grade I endometrial cancer.  The patient was in normal state of health.  Her last menstrual period was 5 years ago when she experienced a gush of fluid,she went to her gynecologist who performed an endometrial biopsy, theof which have been reviewed here at [facility], which showed a grade 1 endometrial cancer.  The patient has no further vaginal fluid and she has no complaints.  REVIEW OF SYSTEMS:  A 10-point review of systems is otherwise MEDICAL HISTORY:  Diabetes, which is diet controlled and hypothyroidism. OBSTETRICAL HISTORY:  Para 4-0-1-4, C-section x4 andx1. GYNECOLOGICAL HISTORY:  10, q.28 and 5 days.  Last menstrual cycle approximately 5 years ago.  She did have a history of abnormal Pap smears.  This appears to be related to a history of hyperplasia for which she cannot fully define.  Interval History: N/A  Past Medical Hx:  Past Medical History  Diagnosis Date  . Diet-controlled type 2 diabetes mellitus   . Hypothyroidism   . Hyperlipidemia   . Arthritis   . 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 History of chicken pox   . 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 PONV (postoperative nausea and vomiting)   . Easy bruising   . Heart murmur     ? functional   Past Surgical Hx:  Past Surgical History  Procedure Date  . Cesarean section     x's 4  . Fracture surgery     nose as a child  . Dilation and curettage of uterus    Current Meds: @ENCMED @ Allergy:  Allergies  Allergen Reactions  . Crestor (Rosuvastatin Calcium)     Muscle ache  . Statins    Social Hx:   History   Social History  . Marital Status: Married    Spouse Name: N/A    Number of Children: N/A  . Years of Education: N/A   Occupational History  . Not on file.    Social History Main Topics  . Smoking status: Never Smoker   . Smokeless tobacco: Never Used  . Alcohol Use: 0.0 oz/week     1 beer weekly  . Drug Use: No  . Sexually Active: Not on file   Other Topics Concern  . 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 Heart disease Brother 75    cardiomyopathy s/p ICD, on transplant list  . Cancer Father 95    prostrate  . Hypertension Father   . Arthritis Mother     Pertinent Gynecological History:  ROS: Vitals:  There were no vitals taken for this visit.  Physical Exam: Exam per Dr. Ginnie Smart 11/15/11.  GENERAL: She is in no apparent distress, alert, awake and oriented x3, no lymphadenopathy. LUNGS: Clear to auscultation bilaterally. HEART: Regular rate and rhythm. No rubs, murmurs or gallops. EXTREMITIES: No clubbing, cyanosis or edema. GENITOURINARY: Vulva and vagina without gross lesions; however, vagina is hypoestrogenized. Cervix is grossly normal. Uterus is small, mobile and nontender. Adnexa are palpable. Rectovaginal exam confirms this exam. ABDOMEN:  Please note that the patient has a Pfannenstiel scar consistent with for previous C-section.   Assessment/Plan: ASSESSMENT AND PLAN: This is a 56 year old with a newly diagnosed grade I endometrial cancer. The patient and her family are fully counseled on these findings today. She understands the need for a management of this condition and our preference would be surgical therapy with hysterectomy, BSO, and likely pelvic if not periaortic lymphadenectomy depending on frozen section at the time of her surgery. We have offered her a minimally invasive approach with the use of robotic surgery. Risks, benefits and alternatives are reviewed. The patient does live in [place] and she would prefer to have her surgery there. We have  found a date of 7/16 with Dr. Duard Brady we will be setting up a preoperative appointment for her to have surgery then.    Tiandre Teall A., MD 12/04/2011, 5:21 AM

## 2011-12-04 NOTE — Transfer of Care (Signed)
Immediate Anesthesia Transfer of Care Note  Patient: Cheryl Chan  Procedure(s) Performed: Procedure(s) (LRB): ROBOTIC ASSISTED LAPAROSCOPIC VAGINAL HYSTERECTOMY (N/A)  Patient Location: PACU  Anesthesia Type: General  Level of Consciousness: awake and alert   Airway & Oxygen Therapy: Patient Spontanous Breathing and Patient connected to face mask oxygen  Post-op Assessment: Report given to PACU RN and Post -op Vital signs reviewed and stable  Post vital signs: Reviewed and stable  Complications: No apparent anesthesia complications

## 2011-12-04 NOTE — Anesthesia Postprocedure Evaluation (Signed)
Anesthesia Post Note  Patient: Cheryl Chan  Procedure(s) Performed: Procedure(s) (LRB): ROBOTIC ASSISTED LAPAROSCOPIC VAGINAL HYSTERECTOMY (N/A)  Anesthesia type: General  Patient location: PACU  Post pain: Pain level controlled  Post assessment: Post-op Vital signs reviewed  Last Vitals:  Filed Vitals:   12/04/11 0945  BP: 127/52  Pulse: 54  Temp:   Resp: 16    Post vital signs: Reviewed  Level of consciousness: sedated  Complications: No apparent anesthesia complications

## 2011-12-04 NOTE — Op Note (Signed)
PATIENT: Cheryl Chan DATE OF BIRTH: 19-Nov-1955 ENCOUNTER DATE: 12/04/2011   Preop Diagnosis: grade 1 endometrioid adenocarcinoma.   Postoperative Diagnosis: same.   Surgery: Total robotic hysterectomy bilateral salpingo-oophorectomy, right pelvic lymph node dissection.   Surgeons:  Bernita Buffy. Duard Brady, MD; Antionette Char, MD   Assistant: Telford Nab   Anesthesia: General   Estimated blood loss: 25 ml   IVF: 1100 ml   Urine output:  225 ml   Complications: None   Pathology: Uterus, cervix, bilateral tubes and ovaries, right pelvic lymph nodes to pathology.   Operative findings: Normal size uterus and normal adnexal.  Abdominal survey negative.  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 Frozen section revealed a grade 1 endometrial adenocarcinoma with minimal to no myometrial invasion.   Procedure: The patient was identified in the preoperative holding area. Informed consent was signed on the chart. Patient was seen history was reviewed and exam was performed.   The patient was then taken to the operating room and placed in the supine position with SCD hose on. General anesthesia was then induced without difficulty. She was then placed in the dorsolithotomy position. Her arms were tucked at her side with appropriate precautions on the gel pad. Shoulder blocks were then placed in the usual fashion with appropriate precautions. A OG-tube was placed to suction. First timeout was performed to confirm the patient procedure, antibiotic and allergy status, estimated estimated blood loss and OR time. The perineum was then prepped in the usual fashion with Betadine. A 14 French Foley was inserted into the bladder under sterile conditions. A sterile speculum was placed in the vagina. The cervix was without lesions. The cervix was grasped with a single-tooth tenaculum. The dilator without difficulty. A ZUMI with a small Koe ring was placed without difficulty. The abdomen was then prepped  with 1 Chlor prep sponges per protocol.    Patient was then draped after the prep was dried. Second timeout was performed to confirm the above. After again confirming OG tube placement and it was to suction. A stab-wound was made in left upper quadrant 2 cm below the costal margin on the left in the midclavicular line. A 5 mm operative report was used to assure intra-abdominal placement. The abdomen was insufflated. At this point all points during the procedure the patient's intra-abdominal pressure was not increased over 15 mm of mercury. After insufflation was complete, the patient was placed in deep Trendelenburg position. 25 cm above the pubic symphysis that area was marked the camera port. Bilateral robotic ports were marked 10 cm from the midline incision at approximately 5 angle. Under direct visualization each of the trochars was placed into the abdomen after skin incisions were made. The small bowel was folded on its mesentery to allow visualization to the pelvis. The 5 mm LUQ port was then converted to a 10/12 port under direct visualization.  After assuring adequate visualization, the robot was then docked in the usual fashion. Under direct visualization the robotic instruments replaced.   The round ligament on the patient's right side was transected with monopolar cautery. The anterior and posterior leaves of the broad ligament were then taken down in the usual fashion. The ureter was identified on the medial leaf of the broad ligament. A window was made between the IP and the ureter. The IP was coagulated with bipolar cautery and transected. The posterior leaf of the broad ligament was taken down to the level of the KOH ring. The bladder flap was created using meticulous  dissection and pinpoint cautery. The uterine vessels were coagulated with bipolar cautery. The uterine vessels were then transected and the C loop was created. The same procedure was performed on the patient's left side.   The  pneumo-occulder in the vagina was then insufflated. The colpotomy was then created in the usual fashion. The specimen was then delivered to the vagina and sent for frozen section. Our attention was then drawn to opening the paravesical space on her right side the perirectal space was also opened. The obturator nerve was identified. The nodal bundle extending over the external iliac artery down to the external iliac vein was taken down using sharp dissection and monopolar cautery. The genitofemoral nerve was identified and spared. We continued our dissection down to the level of the obturator nerve. The nodal bundle superior to the obturator nerve was taken. All pedicles were noted to be hemostatic the ureter was noted to be well medial of the area of dissection. The nodal bundle was then placed and an Endo catch bag.   All specimens were delivered to the vagina. At this point frozen section returned as minimal to no myometrial invasion of grade 1 lesion. The decision was made to stop the procedure is no further lymphadenectomy was indicated.   The vaginal cuff was closed with a running 0 Vicryl on CT 1 suture. The suture frayed on the end and a second suture was used and started from the left side to completely close to vagina. The abdomen and pelvis were copiously irrigated and noted to be hemostatic. The robotic instruments were removed under direct visualization as were the robotic trochars. The pneumoperitoneum was removed. The patient was then taken out of the Trendelenburg position.   Using of 0 Vicryl on a UR 6 needle the midline port port fascia was closed after grasping the fascial edges with cocker clamp. The left upper quadrant port subcutaneous tissues were reapproximated. The skin was closed using 4-0 Vicryl. Steri-Strips and benzoin were applied. The pneumo occluded balloon was removed from the vagina. The vagina was swabbed and noted to be hemostatic.    All instrument needle and Ray-Tec  counts were correct x2. The patient tolerated the procedure well and was taken to the recovery room in stable condition. This is Cheryl Chan dictating an operative note on patient Triva Hueber.

## 2011-12-04 NOTE — Plan of Care (Signed)
Problem: Phase I Progression Outcomes Goal: Pain controlled with appropriate interventions Outcome: Progressing progressing  Goal: Incision/dressings dry and intact Outcome: Progressing progressing  Goal: Tubes/drains patent Outcome: Progressing progressing  Goal: Voiding-avoid urinary catheter unless indicated Outcome: Progressing progressing  Goal: Vital signs/hemodynamically stable Outcome: Progressing progressing

## 2011-12-04 NOTE — Anesthesia Preprocedure Evaluation (Addendum)
Anesthesia Evaluation  Patient identified by MRN, date of birth, ID band Patient awake    Reviewed: Allergy & Precautions, H&P , NPO status , Patient's Chart, lab work & pertinent test results  History of Anesthesia Complications (+) PONV  Airway Mallampati: II TM Distance: >3 FB Neck ROM: Full    Dental  (+) Teeth Intact and Dental Advisory Given   Pulmonary neg pulmonary ROS,  breath sounds clear to auscultation  Pulmonary exam normal       Cardiovascular negative cardio ROS  Rhythm:Regular Rate:Normal     Neuro/Psych Depression negative neurological ROS     GI/Hepatic negative GI ROS, Neg liver ROS,   Endo/Other  Type 2Hypothyroidism   Renal/GU negative Renal ROS  negative genitourinary   Musculoskeletal negative musculoskeletal ROS (+)   Abdominal   Peds  Hematology negative hematology ROS (+)   Anesthesia Other Findings   Reproductive/Obstetrics negative OB ROS                          Anesthesia Physical Anesthesia Plan  ASA: II  Anesthesia Plan: General   Post-op Pain Management:    Induction: Intravenous  Airway Management Planned: Oral ETT  Additional Equipment:   Intra-op Plan:   Post-operative Plan: Extubation in OR  Informed Consent: I have reviewed the patients History and Physical, chart, labs and discussed the procedure including the risks, benefits and alternatives for the proposed anesthesia with the patient or authorized representative who has indicated his/her understanding and acceptance.   Dental advisory given  Plan Discussed with: CRNA  Anesthesia Plan Comments:         Anesthesia Quick Evaluation

## 2011-12-05 ENCOUNTER — Other Ambulatory Visit: Payer: Self-pay | Admitting: *Deleted

## 2011-12-05 ENCOUNTER — Encounter (HOSPITAL_COMMUNITY): Payer: Self-pay | Admitting: Gynecologic Oncology

## 2011-12-05 DIAGNOSIS — C55 Malignant neoplasm of uterus, part unspecified: Secondary | ICD-10-CM

## 2011-12-05 LAB — BASIC METABOLIC PANEL
BUN: 6 mg/dL (ref 6–23)
CO2: 24 mEq/L (ref 19–32)
Chloride: 96 mEq/L (ref 96–112)
GFR calc non Af Amer: >90 mL/min (ref >90)
Glucose, Bld: 133 mg/dL — ABNORMAL HIGH (ref 70–99)
Potassium: 3.3 mEq/L — ABNORMAL LOW (ref 3.5–5.1)
Sodium: 128 mEq/L — ABNORMAL LOW (ref 135–145)

## 2011-12-05 LAB — CBC
HCT: 31.5 % — ABNORMAL LOW (ref 36.0–46.0)
Hemoglobin: 10.6 g/dL — ABNORMAL LOW (ref 12.0–15.0)
MCHC: 33.7 g/dL (ref 30.0–36.0)
RBC: 3.53 MIL/uL — ABNORMAL LOW (ref 3.87–5.11)
WBC: 8.6 10*3/uL (ref 4.0–10.5)

## 2011-12-05 MED ORDER — OXYCODONE-ACETAMINOPHEN 5-325 MG PO TABS: 1.0000 | ORAL_TABLET | ORAL | Status: AC | PRN

## 2011-12-05 NOTE — Plan of Care (Signed)
Problem: Phase II Progression Outcomes Goal: Foley discontinued Outcome: Completed/Met Date Met:  12/05/11 Discharged this AM and pt is voiding without difficulties.

## 2011-12-05 NOTE — Discharge Summary (Signed)
Physician Discharge Summary  Patient ID: Cheryl Chan MRN: [id number] DOB/AGE: 56-Jun-1957 56 y.o.  Admit date: 12/04/2011 Discharge date: 12/05/2011  Admission Diagnoses: Uterine cancer  Discharge Diagnoses:  Principal Problem:  *Uterine cancer  Discharged Condition: good  Hospital Course: On 12/04/2011, the patient underwent the following: Procedure(s): ROBOTIC ASSISTED LAPAROSCOPIC VAGINAL HYSTERECTOMY.   The postoperative course was uneventful.  She was discharged to home on postoperative day 1 tolerating a regular diet.  Consults: None  Significant Diagnostic Studies: None  Treatments: surgery: See above  Discharge Exam: Blood pressure 112/67, pulse 71, temperature 98.7 F (37.1 C), temperature source Oral, resp. rate 18, height 5\' 2"  (1.575 m), weight 151 lb (68.493 kg), SpO2 93.00%. General appearance: alert, cooperative and no distress Resp: clear to auscultation bilaterally Cardio: regular rate and rhythm, S1, S2 normal, no murmur, click, rub or gallop GI: soft, non-tender; bowel sounds normal; no masses,  no organomegaly Extremities: extremities normal, atraumatic, no cyanosis or edema Incision/Wound: Lap sites with steri strips clean, dry, and intact  Disposition: Final discharge disposition not confirmed  Discharge Orders    Future Appointments: Provider: Department: Dept Phone: Center:   03/10/2012 3:30 PM Newt Lukes, MD [facility] [phone] [facility]     Future Orders Please Complete By Expires   Diet - low sodium heart healthy      Increase activity slowly      Driving Restrictions      Comments:   Do not take narcotics and drive.   Lifting restrictions      Comments:   No lifting greater than 30 lbs.   Sexual Activity Restrictions      Comments:   No sexual activity for 8 weeks.   Call MD for:  temperature >100.4      Call MD for:  persistant nausea and vomiting      Call MD for:  severe uncontrolled pain      Call MD for:  redness, tenderness,  or signs of infection (pain, swelling, redness, odor or green/yellow discharge around incision site)      Call MD for:  difficulty breathing, headache or visual disturbances      Call MD for:  hives      Call MD for:  persistant dizziness or light-headedness      Call MD for:  extreme fatigue        Medication List  As of 12/05/2011  9:39 AM   STOP taking these medications         acetaminophen 500 MG tablet         TAKE these medications         fluticasone 50 MCG/ACT nasal spray   Commonly known as: FLONASE   Place 2 sprays into the nose daily as needed.      oxyCODONE-acetaminophen 5-325 MG per tablet   Commonly known as: PERCOCET/ROXICET   Take 1-2 tablets by mouth every 4 (four) hours as needed (moderate to severe pain (when tolerating fluids)).      sertraline 100 MG tablet   Commonly known as: ZOLOFT   Take 100 mg by mouth every morning.      thyroid 90 MG tablet   Commonly known as: ARMOUR   Take 1 tablet (90 mg total) by mouth daily.            Signed: Carlei Huang DEAL 12/05/2011, 9:39 AM

## 2011-12-05 NOTE — Progress Notes (Signed)
Pt discharged to home. DC instructions given. No concerns voiced. Pt voices importance of following up on sodium level as it is low. Left unit, accompanied by husband,  ambulating to meet ride downstairs. Refused to be wheeled downstairs. Left in good condition. Vwilliams,rn.

## 2011-12-07 ENCOUNTER — Other Ambulatory Visit: Payer: 59 | Admitting: Lab

## 2011-12-07 DIAGNOSIS — C55 Malignant neoplasm of uterus, part unspecified: Secondary | ICD-10-CM

## 2011-12-07 LAB — SODIUM: Sodium: 138 mEq/L (ref 135–145)

## 2011-12-10 ENCOUNTER — Telehealth: Payer: Self-pay | Admitting: Gynecologic Oncology

## 2011-12-10 NOTE — Telephone Encounter (Signed)
Pt informed of Na+ level 138.  No questions or concerns voiced.  Pt informed of pathology earlier today and follow up appointment arranged.  Instructed to call for any questions or concerns.

## 2011-12-28 ENCOUNTER — Other Ambulatory Visit: Payer: Self-pay | Admitting: *Deleted

## 2011-12-28 MED ORDER — FLUTICASONE PROPIONATE 50 MCG/ACT NA SUSP: 2.0000 | Freq: Every day | NASAL | Status: DC | PRN

## 2012-01-31 ENCOUNTER — Encounter: Payer: Self-pay | Admitting: Gynecologic Oncology

## 2012-01-31 ENCOUNTER — Ambulatory Visit: Payer: 59 | Attending: Gynecologic Oncology | Admitting: Gynecologic Oncology

## 2012-01-31 VITALS — BP 92/60 | HR 64 | Temp 98.2°F | Resp 16 | Ht 62.0 in | Wt 153.0 lb

## 2012-01-31 DIAGNOSIS — C549 Malignant neoplasm of corpus uteri, unspecified: Secondary | ICD-10-CM | POA: Insufficient documentation

## 2012-01-31 DIAGNOSIS — C541 Malignant neoplasm of endometrium: Secondary | ICD-10-CM

## 2012-01-31 NOTE — Progress Notes (Signed)
Consult Note: Gyn-Onc  [name] 56 y.o. female  CC:  Chief Complaint  Patient presents with  . Endometrial cancer    Follow up    HPI: Patient is a 56 year old who was initially seen by Dr. Ginnie Smart at [facility]. She was referred by Dr. Leda Quail for endometrial carcinoma.  Patient is a 56 year old para [id number] experienced a gush of fluid was seen by her gynecologist. Endometrial biopsy revealed a grade 1 endometrial cancer. Her prior history was menopause approximately 5 years prior. After discussion she opted for surgical management. On July 16 of year 2003 she underwent a total robotic hysterectomy bilateral salpingo-oophorectomy and right pelvic lymph node dissection. The operative findings included normal uterus cervix and adnexa. There is adhesive disease of the bladder to the anterior uterus. Frozen section revealed a grade 1 endometrial adenocarcinoma with minimal to no myometrial invasion.  Final pathology revealed a grade 1 endometrioid adenocarcinoma that extended 0.4 cm with the myometrium was 1.2 cm in thickness. There was no lymphovascular space involvement. The maximum tumor size was 1.5 cm it was grade 1 lesion. The adnexa were negative and 0/5 right pelvic lymph nodes were involved. She comes in today for her postoperative check.  She is overall doing very well. She returned to work 3 weeks after her surgery. She's had a small scant amount of vaginal bleeding with vigorous exercise. She's not yet sexually active. She has normal resumption of bowel and bladder habits. She is eating well and is overall feeling very well and very pleased with her pathology report.   Current Meds:  Outpatient Encounter Prescriptions as of 01/31/2012  Medication Sig Dispense Refill  . fluticasone (FLONASE) 50 MCG/ACT nasal spray Place 2 sprays into the nose daily as needed.  48 g  0  . sertraline (ZOLOFT) 100 MG tablet Take 100 mg by mouth every morning.      . thyroid (ARMOUR) 90 MG tablet Take 1  tablet (90 mg total) by mouth daily.  90 tablet  1    Allergy:  Allergies  Allergen Reactions  . Crestor (Rosuvastatin Calcium)     Muscle ache  . Statins     Social Hx:   History   Social History  . Marital Status: Married    Spouse Name: N/A    Number of Children: N/A  . Years of Education: N/A   Occupational History  . Not on file.   Social History Main Topics  . Smoking status: Never Smoker   . Smokeless tobacco: Never Used  . Alcohol Use: 0.0 oz/week     1 beer weekly  . Drug Use: No  . Sexually Active: Not on file   Other Topics Concern  . 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 Cesarean section     x's 4  . Fracture surgery     nose as a child  . Dilation and curettage of uterus   . Robotic assisted lap vaginal hysterectomy 12/04/2011    Procedure: ROBOTIC ASSISTED LAPAROSCOPIC VAGINAL HYSTERECTOMY;  Surgeon: Rejeana Brock A. Duard Brady, MD;  Location: [facility];  Service: Gynecology;  Laterality: N/A;    Past Medical Hx:  Past Medical History  Diagnosis Date  . Diet-controlled type 2 diabetes mellitus   . Hypothyroidism   . Hyperlipidemia   . Arthritis   .  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 History of chicken pox   . 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 PONV (postoperative nausea and vomiting)   . Easy bruising   . Heart murmur     ? functional    Family Hx:  Family History  Problem Relation Age of Onset  . Heart disease Brother 32    cardiomyopathy s/p ICD, on transplant list  . Cancer Father 55    prostrate  . Hypertension Father   . Arthritis Mother     Vitals:  Blood pressure 92/60, pulse 64, temperature 98.2 F (36.8 C), resp. rate 16, height 5\' 2"  (1.575 m), weight 153 lb (69.4 kg).  Physical Exam:  Well-nourished well-developed female in no acute  distress.  Abdomen: Well-healed surgical incisions. Abdomen is soft and nontender. There is no incisional hernias.  Extremities: No edema.  Pelvic: Normal external female genitalia. Vagina is atrophic. Vaginal cuff is visualized. It is healing well. The suture line is intact. Suture material is still visible. Bimanual examination reveals no masses nodularity fluctuance or tenderness.  Assessment/Plan:  56 year old with no family history of gynecologic malignancies has a stage IA grade 1 endometrioid adenocarcinoma. Based on her age, grade, depth of invasion and lymphovascular space involvement she is not require any additional treatment. She return to see [place] in 6 months for her first surveillance exam. We will then begin alternating visits with Dr. Hyacinth Meeker. She was asked to refrain from intercourse another 3-4 weeks to allow the vagina to completely heal. She is very comfortable with this. Thunder Bridgewater A., MD 01/31/2012, 4:22 PM

## 2012-01-31 NOTE — Patient Instructions (Signed)
RTC in 6 months.

## 2012-02-12 ENCOUNTER — Other Ambulatory Visit: Payer: Self-pay | Admitting: Internal Medicine

## 2012-03-04 ENCOUNTER — Telehealth: Payer: Self-pay | Admitting: Internal Medicine

## 2012-03-04 DIAGNOSIS — E119 Type 2 diabetes mellitus without complications: Secondary | ICD-10-CM

## 2012-03-04 DIAGNOSIS — E039 Hypothyroidism, unspecified: Secondary | ICD-10-CM

## 2012-03-04 NOTE — Telephone Encounter (Signed)
The patient is hoping to get her lab work done before her follow up apt that was re-scheduled for November 4th.  Her callback number is [phone]

## 2012-03-04 NOTE — Telephone Encounter (Signed)
tsh and a1c ordered

## 2012-03-10 ENCOUNTER — Ambulatory Visit: Payer: 59 | Admitting: Internal Medicine

## 2012-03-24 ENCOUNTER — Ambulatory Visit (INDEPENDENT_AMBULATORY_CARE_PROVIDER_SITE_OTHER): Payer: 59 | Admitting: Internal Medicine

## 2012-03-24 ENCOUNTER — Other Ambulatory Visit (INDEPENDENT_AMBULATORY_CARE_PROVIDER_SITE_OTHER): Payer: 59

## 2012-03-24 ENCOUNTER — Telehealth: Payer: Self-pay | Admitting: *Deleted

## 2012-03-24 ENCOUNTER — Encounter: Payer: Self-pay | Admitting: Internal Medicine

## 2012-03-24 VITALS — BP 132/78 | HR 71 | Temp 97.1°F | Ht 62.0 in | Wt 151.0 lb

## 2012-03-24 DIAGNOSIS — E785 Hyperlipidemia, unspecified: Secondary | ICD-10-CM

## 2012-03-24 DIAGNOSIS — E119 Type 2 diabetes mellitus without complications: Secondary | ICD-10-CM

## 2012-03-24 DIAGNOSIS — Z23 Encounter for immunization: Secondary | ICD-10-CM

## 2012-03-24 DIAGNOSIS — E039 Hypothyroidism, unspecified: Secondary | ICD-10-CM

## 2012-03-24 DIAGNOSIS — Z0000 Encounter for general adult medical examination without abnormal findings: Secondary | ICD-10-CM

## 2012-03-24 LAB — TSH: TSH: 1.2 u[IU]/mL (ref 0.35–5.50)

## 2012-03-24 LAB — HEMOGLOBIN A1C: Hgb A1c MFr Bld: 6.3 % (ref 4.6–6.5)

## 2012-03-24 NOTE — Assessment & Plan Note (Signed)
On Armour Check TSH q 77mo and adjust dose if needed The current medical regimen is effective;  continue present plan and medications. Lab Results  Component Value Date   TSH 1.20 03/24/2012

## 2012-03-24 NOTE — Progress Notes (Signed)
  Subjective:    Patient ID: Cheryl Chan, female    DOB: [birth year], 55 y.o.   MRN: [id number]  HPI Here for follow up -reviewed chronic medical issues:  DM2, diet controlled since 2010 - hx gestational DM requiring insulin x 2 preg, stopped metformin 2010 due to a1c <6. Follows careful diet and exercise program with close attention to weight  Dyslipidemia - intolerant of statins - has tried several in past - also intol of WelChol - follows careful diet/exercise program to control weight to manage same  Hypothyroid - prefers Armour - the patient reports compliance with medication(s) as prescribed. Denies adverse side effects.  Past Medical History  Diagnosis Date  . Diet-controlled type 2 diabetes mellitus   . Hypothyroidism   . Hyperlipidemia   . Arthritis   . 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 History of chicken pox   . 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 Endometrial cancer 10/2011 dx    s/p vag hyst 11/2011     Review of Systems Constitutional: Negative for fever or unexpected weight change.  Respiratory: Negative for cough and shortness of breath.   Cardiovascular: Negative for chest pain or palpitations.      Objective:   Physical Exam  BP 132/78  Pulse 71  Temp 97.1 F (36.2 C) (Oral)  Ht 5\' 2"  (1.575 m)  Wt 151 lb (68.493 kg)  BMI 27.62 kg/m2  SpO2 96% Wt Readings from Last 3 Encounters:  03/24/12 151 lb (68.493 kg)  01/31/12 153 lb (69.4 kg)  12/04/11 151 lb (68.493 kg)   Constitutional: She appears well-developed and well-nourished. No distress.  Neck: Normal range of motion. Neck supple. No JVD present. No thyromegaly present.  Cardiovascular: Normal rate, regular rhythm and normal heart sounds.  No murmur heard. No BLE edema. Pulmonary/Chest: Effort normal and breath sounds normal. No respiratory distress. She has no wheezes. Psychiatric: She has a normal mood and affect. Her behavior is normal. Judgment and thought content normal.   Lab  Results  Component Value Date   WBC 8.6 12/05/2011   HGB 10.6* 12/05/2011   HCT 31.5* 12/05/2011   PLT 191 12/05/2011   GLUCOSE 133* 12/05/2011   CHOL 219* 09/10/2011   TRIG 154.0* 09/10/2011   HDL 54.00 09/10/2011   LDLDIRECT 146.7 09/10/2011   ALT 13 11/30/2011   AST 16 11/30/2011   NA 138 12/07/2011   K 3.3* 12/05/2011   CL 96 12/05/2011   CREATININE 0.51 12/05/2011   BUN 6 12/05/2011   CO2 24 12/05/2011   TSH 1.20 03/24/2012   HGBA1C 6.3 03/24/2012   MICROALBUR 0.2 09/10/2011       Assessment & Plan:   See problem list. Medications and labs reviewed today.

## 2012-03-24 NOTE — Assessment & Plan Note (Signed)
Hx gestational DM Previously on metformin - none since 2010 Check a1c q 12mo to monitor Encouraged continued weight control, diet and exercsie Lab Results  Component Value Date   HGBA1C 6.3 03/24/2012

## 2012-03-24 NOTE — Telephone Encounter (Signed)
Message copied by Deatra James on Mon Mar 24, 2012  4:40 PM ------      Message from: Etheleen Sia      Created: Mon Mar 24, 2012  4:34 PM      Regarding: LABS       PHYSICAL LABS IN MAY

## 2012-03-24 NOTE — Telephone Encounter (Signed)
Received staff msg pt made cpx for May. Entering labs in epic...Raechel Chute

## 2012-03-24 NOTE — Assessment & Plan Note (Signed)
Intolerant of statins - previous trials of Crestor, lipitor, Vytorin all caused extreme myalgia, fatigue and memory problems controls with diet/exercise and weight control Check annually to monitor same  

## 2012-03-24 NOTE — Patient Instructions (Signed)
It was good to see you today. We have reviewed your prior records including labs and tests today Medications reviewed, no changes at this time. Flu shot today Please schedule followup in 6 months for physical labs and diabetes mellitus/thyroid check, call sooner if problems.

## 2012-04-16 ENCOUNTER — Other Ambulatory Visit: Payer: Self-pay | Admitting: Internal Medicine

## 2012-05-13 ENCOUNTER — Other Ambulatory Visit: Payer: Self-pay | Admitting: Internal Medicine

## 2012-07-15 ENCOUNTER — Other Ambulatory Visit: Payer: Self-pay | Admitting: Obstetrics & Gynecology

## 2012-07-15 DIAGNOSIS — Z1231 Encounter for screening mammogram for malignant neoplasm of breast: Secondary | ICD-10-CM

## 2012-07-24 ENCOUNTER — Ambulatory Visit
Admission: RE | Admit: 2012-07-24 | Discharge: 2012-07-24 | Disposition: A | Discharge status: Home / Self Care | Payer: 59 | Source: Ambulatory Visit | Attending: Obstetrics & Gynecology | Admitting: Obstetrics & Gynecology

## 2012-07-24 DIAGNOSIS — Z1231 Encounter for screening mammogram for malignant neoplasm of breast: Secondary | ICD-10-CM

## 2012-07-31 ENCOUNTER — Ambulatory Visit: Payer: 59 | Attending: Gynecologic Oncology | Admitting: Gynecologic Oncology

## 2012-07-31 ENCOUNTER — Encounter: Payer: Self-pay | Admitting: Gynecologic Oncology

## 2012-07-31 VITALS — BP 120/62 | HR 80 | Temp 98.8°F | Resp 16 | Ht 62.0 in | Wt 158.7 lb

## 2012-07-31 DIAGNOSIS — C541 Malignant neoplasm of endometrium: Secondary | ICD-10-CM

## 2012-07-31 DIAGNOSIS — E039 Hypothyroidism, unspecified: Secondary | ICD-10-CM | POA: Insufficient documentation

## 2012-07-31 DIAGNOSIS — E785 Hyperlipidemia, unspecified: Secondary | ICD-10-CM | POA: Insufficient documentation

## 2012-07-31 DIAGNOSIS — C549 Malignant neoplasm of corpus uteri, unspecified: Secondary | ICD-10-CM | POA: Insufficient documentation

## 2012-07-31 DIAGNOSIS — Z79899 Other long term (current) drug therapy: Secondary | ICD-10-CM | POA: Insufficient documentation

## 2012-07-31 NOTE — Patient Instructions (Signed)
Follow up with Dr. Hyacinth Meeker in 6 months and with [place] in one year.

## 2012-07-31 NOTE — Progress Notes (Signed)
Consult Note: Gyn-Onc  Cheryl Chan 57 y.o. female  CC:  Chief Complaint  Patient presents with  . Endo ca    Follow up    HPI: Patient is a 57 year old who was initially seen by Dr. Ginnie Smart at [facility]. She was referred by Dr. Leda Quail for endometrial carcinoma.  She had experienced a gush of fluid was seen by her gynecologist. Endometrial biopsy revealed a grade 1 endometrial cancer. Her prior history was menopause approximately 5 years prior. After discussion she opted for surgical management. On July 16 of year 2013 she underwent a total robotic hysterectomy bilateral salpingo-oophorectomy and right pelvic lymph node dissection. The operative findings included normal uterus cervix and adnexa. There is adhesive disease of the bladder to the anterior uterus. Frozen section revealed a grade 1 endometrial adenocarcinoma with minimal to no myometrial invasion.   Final pathology revealed a grade 1 endometrioid adenocarcinoma that extended 0.4 cm with the myometrium was 1.2 cm in thickness. There was no lymphovascular space involvement. The maximum tumor size was 1.5 cm it was grade 1 lesion. The adnexa were negative and 0/5 right pelvic lymph nodes were involved.   Interval History:  She is overall doing very well. She has gained about 5 pounds due to "eating to much". Her husband who gives himself interferon injections developed an abscess that developed sepsis with MRSA. He was hospitalized for a prolonged period of time and had numerous surgeries. He still has a wound VAC in place. He is doing better. She states that 2013 was a very stressful year. She and her husband are hoping to document of a house which is the source of enjoyment for her.  Review of Systems: She denies any chest pain, shortness of breath, nausea, vomiting, chest pain, shortness of breath. She has any unintentional but not unexpected weight gain as stated above. She and her husband have not been sexually active. She has not  noticed any bleeding or change in her bowel or bladder habits. 10 point review of systems is otherwise negative.  Current Meds:  Outpatient Encounter Prescriptions as of 07/31/2012  Medication Sig Dispense Refill  . ARMOUR THYROID 90 MG tablet TAKE 1 TABLET BY MOUTH DAILY.  90 tablet  2  . fluticasone (FLONASE) 50 MCG/ACT nasal spray Place 2 sprays into the nose daily as needed.  48 g  0  . sertraline (ZOLOFT) 100 MG tablet TAKE 1 TABLET BY MOUTH ONCE DAILY  90 tablet  1   No facility-administered encounter medications on file as of 07/31/2012.    Allergy:  Allergies  Allergen Reactions  . Crestor (Rosuvastatin Calcium)     Muscle ache  . Statins     Social Hx:   History   Social History  . Marital Status: Married    Spouse Name: N/A    Number of Children: N/A  . Years of Education: N/A   Occupational History  . Not on file.   Social History Main Topics  . Smoking status: Never Smoker   . Smokeless tobacco: Never Used  . Alcohol Use: 0.0 oz/week     Comment: 1 beer weekly  . Drug Use: No  . Sexually Active: Not on file   Other Topics Concern  . 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  Cesarean section      x's 4  . Fracture surgery      nose as a child  . Dilation and curettage of uterus    . Robotic assisted lap vaginal hysterectomy  12/04/2011    Procedure: ROBOTIC ASSISTED LAPAROSCOPIC VAGINAL HYSTERECTOMY;  Surgeon: Rejeana Brock A. Duard Brady, MD;  Location: [facility];  Service: Gynecology;  Laterality: N/A;    Past Medical Hx:  Past Medical History  Diagnosis Date  . Diet-controlled type 2 diabetes mellitus   . Hypothyroidism   . Hyperlipidemia   . Arthritis   . 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 History of chicken pox   . 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 Endometrial  cancer 10/2011 dx    s/p vag hyst 11/2011    Family Hx:  Family History  Problem Relation Age of Onset  . Heart disease Brother 82    cardiomyopathy s/p ICD, on transplant list  . Cancer Father 23    prostrate  . Hypertension Father   . Arthritis Mother     Vitals:  Blood pressure 120/62, pulse 80, temperature 98.8 F (37.1 C), temperature source Oral, resp. rate 16, height 5\' 2"  (1.575 m), weight 158 lb 11.2 oz (71.986 kg).  Physical Exam: Neck is supple, there is no lymphadenopathy no thyromegaly  Lungs: Clear to auscultation bilaterally.  Cardiovascular: Regular rate rhythm.  Abdomen: Well-healed surgical incisions. There is no evidence of any incisional hernias. She has an ecchymosis on her left lower quadrant. There is no masses or hepatosplenomegaly. Abdomen is nontender.  Groins: No lymphadenopathy.  Extremities: No edema.  Pelvic: Normal external female genitalia. Vagina slightly atrophic. There is no vaginal cuff lesions. Bimanual examination reveals no masses or nodularity. Rectal confirms.  Assessment/Plan: 57 year old diagnosed with a stage IA grade 1 endometrial adenocarcinoma status post definitive surgery in July 2013. She has no evidence of recurrent disease. She will followup with Dr. Hyacinth Meeker in 6 months for her annual examination of the Pap smear once a year from the time of the anniversary of her diagnosis. She'll followup with [place] in 1 year. She knows to contact us if she has been issues with bleeding pelvic or abdominal pain.  GEHRIG,PAOLA A., MD 07/31/2012, 9:43 AM

## 2012-08-03 ENCOUNTER — Emergency Department (HOSPITAL_COMMUNITY)
Admission: EM | Admit: 2012-08-03 | Discharge: 2012-08-03 | Disposition: A | Discharge status: Home / Self Care | Payer: 59 | Attending: Emergency Medicine | Admitting: Emergency Medicine

## 2012-08-03 ENCOUNTER — Emergency Department (HOSPITAL_COMMUNITY): Payer: 59

## 2012-08-03 DIAGNOSIS — E039 Hypothyroidism, unspecified: Secondary | ICD-10-CM | POA: Insufficient documentation

## 2012-08-03 DIAGNOSIS — IMO0002 Reserved for concepts with insufficient information to code with codable children: Secondary | ICD-10-CM | POA: Insufficient documentation

## 2012-08-03 DIAGNOSIS — Z23 Encounter for immunization: Secondary | ICD-10-CM | POA: Insufficient documentation

## 2012-08-03 DIAGNOSIS — E785 Hyperlipidemia, unspecified: Secondary | ICD-10-CM | POA: Insufficient documentation

## 2012-08-03 DIAGNOSIS — S62609A Fracture of unspecified phalanx of unspecified finger, initial encounter for closed fracture: Secondary | ICD-10-CM

## 2012-08-03 DIAGNOSIS — R7611 Nonspecific reaction to tuberculin skin test without active tuberculosis: Secondary | ICD-10-CM | POA: Insufficient documentation

## 2012-08-03 DIAGNOSIS — Z8542 Personal history of malignant neoplasm of other parts of uterus: Secondary | ICD-10-CM | POA: Insufficient documentation

## 2012-08-03 DIAGNOSIS — Z8739 Personal history of other diseases of the musculoskeletal system and connective tissue: Secondary | ICD-10-CM | POA: Insufficient documentation

## 2012-08-03 DIAGNOSIS — F3289 Other specified depressive episodes: Secondary | ICD-10-CM | POA: Insufficient documentation

## 2012-08-03 DIAGNOSIS — E119 Type 2 diabetes mellitus without complications: Secondary | ICD-10-CM | POA: Insufficient documentation

## 2012-08-03 DIAGNOSIS — Y93K1 Activity, walking an animal: Secondary | ICD-10-CM | POA: Insufficient documentation

## 2012-08-03 DIAGNOSIS — Y929 Unspecified place or not applicable: Secondary | ICD-10-CM | POA: Insufficient documentation

## 2012-08-03 DIAGNOSIS — X500XXA Overexertion from strenuous movement or load, initial encounter: Secondary | ICD-10-CM | POA: Insufficient documentation

## 2012-08-03 DIAGNOSIS — Z79899 Other long term (current) drug therapy: Secondary | ICD-10-CM | POA: Insufficient documentation

## 2012-08-03 DIAGNOSIS — F329 Major depressive disorder, single episode, unspecified: Secondary | ICD-10-CM | POA: Insufficient documentation

## 2012-08-03 MED ORDER — OXYCODONE-ACETAMINOPHEN 5-325 MG PO TABS
1.0000 | ORAL_TABLET | Freq: Once | ORAL | Status: AC
  Administered 2012-08-03: 1 via ORAL
  Filled 2012-08-03: qty 1

## 2012-08-03 MED ORDER — TETANUS-DIPHTH-ACELL PERTUSSIS 5-2.5-18.5 LF-MCG/0.5 IM SUSP
0.5000 mL | Freq: Once | INTRAMUSCULAR | Status: AC
  Administered 2012-08-03: 0.5 mL via INTRAMUSCULAR
  Filled 2012-08-03: qty 0.5

## 2012-08-03 MED ORDER — HYDROCODONE-ACETAMINOPHEN 5-325 MG PO TABS: 1.0000 | ORAL_TABLET | ORAL | Status: DC | PRN

## 2012-08-03 NOTE — ED Notes (Signed)
Pt was walking her 150 lb great dane and had the leash wrapped around her left hand the dog ran and it pulled pt left ring finger. Left finger is white and edematous

## 2012-08-03 NOTE — ED Provider Notes (Signed)
History     CSN: [id number]  Arrival date & time 08/03/12  1201   First MD Initiated Contact with Patient 08/03/12 1212      Chief Complaint  Patient presents with  . Finger Injury    (Consider location/radiation/quality/duration/timing/severity/associated sxs/prior treatment) HPI Comments: Patient reports she was walking her daughter's dog with the leash wrapped around her left fingers when the dog ran after another dog and she attempted to pull back and stop her.  Reports pain, swelling, and bruising at PIP joint of left ring finger and bruising over dorsal hand.  This occurred 45 minutes ago and throbbing pain has been constant since.  Pain is 5/10. Denies wrist pain, weakness or numbness of the fingers.  Denies other injury.  Does have an abrasion on index finger of the same hand - she is unsure if that is new or not.  Unsure of last tetanus vx.    The history is provided by the patient.    Past Medical History  Diagnosis Date  . Diet-controlled type 2 diabetes mellitus   . Hypothyroidism   . Hyperlipidemia   . Arthritis   . 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 History of chicken pox   . 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 Endometrial cancer 10/2011 dx    s/p vag hyst 11/2011    Past Surgical History  Procedure Laterality Date  . Cesarean section      x's 4  . Fracture surgery      nose as a child  . Dilation and curettage of uterus    . Robotic assisted lap vaginal hysterectomy  12/04/2011    Procedure: ROBOTIC ASSISTED LAPAROSCOPIC VAGINAL HYSTERECTOMY;  Surgeon: Rejeana Brock A. Duard Brady, MD;  Location: [facility];  Service: Gynecology;  Laterality: N/A;    Family History  Problem Relation Age of Onset  . Heart disease Brother 27    cardiomyopathy s/p ICD, on transplant list  . Cancer Father 32    prostrate  . Hypertension Father   . Arthritis Mother     History  Substance Use Topics  . Smoking status: Never Smoker   . Smokeless tobacco: Never Used  .  Alcohol Use: 0.0 oz/week     Comment: 1 beer weekly    OB History   Grav Para Term Preterm Abortions TAB SAB Ect Mult Living                  Review of Systems  Skin: Positive for color change and wound. Negative for pallor.  Neurological: Negative for syncope, weakness, numbness and headaches.    Allergies  Crestor and Statins  Home Medications   Current Outpatient Rx  Name  Route  Sig  Dispense  Refill  . ARMOUR THYROID 90 MG tablet      TAKE 1 TABLET BY MOUTH DAILY.   90 tablet   2   . fluticasone (FLONASE) 50 MCG/ACT nasal spray   Nasal   Place 2 sprays into the nose daily as needed.   48 g   0   . sertraline (ZOLOFT) 100 MG tablet      TAKE 1 TABLET BY MOUTH ONCE DAILY   90 tablet   1     BP 136/68  Pulse 85  Temp(Src) 99.3 F (37.4 C) (Oral)  Resp 16  SpO2 97%  Physical Exam  Nursing note and vitals reviewed. Constitutional: She appears well-developed and well-nourished. No distress.  HENT:  Head: Normocephalic and atraumatic.  Neck: Neck supple.  Pulmonary/Chest: Effort normal.  Musculoskeletal:       Left wrist: Normal.       Hands: Left 4th finger with limited ROM at PIP with edema and tenderness over joint.  Other digits with full AROM.  All digits' sensation intact, capillary refill < 2 seconds.   Neurological: She is alert.  Skin: She is not diaphoretic.    ED Course  Procedures (including critical care time)  Labs Reviewed - No data to display Dg Hand Complete Left  08/03/2012  *RADIOLOGY REPORT*  Clinical Data: Hand injury and pain.  LEFT HAND - COMPLETE 3+ VIEW  Comparison: None.  Findings: A comminuted nondisplaced fracture is seen involving the middle phalanx of the ring finger.  The volar plate avulsion fracture is also seen arising from the middle phalanx of the little finger at the proximal interphalangeal joint.  No other fractures are identified.  No evidence of dislocation.  IMPRESSION:  1.  Comminuted, nondisplaced fracture  of the middle phalanx of the ring finger. 2.  Volar plate avulsion fracture from the middle phalanx of the little finger at the PIP joint.   Original Report Authenticated By: Myles Rosenthal, M.D.      1. Fracture, finger, closed, initial encounter       MDM  Pt with injury to left ring finger 45 minutes prior to arrival.  Pt sustained nondisplaced communited fracture of middle phalanx.  Per xray, pt also has avulsion fx involving 5th finger though patient is not tender over this area.  Pt placed in finger splint, buddy tape, and ace wrap by ortho tech.  Discussed all results with patient.  Pt given return precautions.  Pt verbalizes understanding and agrees with plan.           Trixie Dredge, PA-C 08/03/12 1430

## 2012-08-04 ENCOUNTER — Ambulatory Visit (HOSPITAL_COMMUNITY)
Admission: RE | Admit: 2012-08-04 | Discharge: 2012-08-04 | Disposition: A | Discharge status: Home / Self Care | Payer: 59 | Source: Ambulatory Visit | Attending: Orthopedic Surgery | Admitting: Orthopedic Surgery

## 2012-08-04 ENCOUNTER — Other Ambulatory Visit: Payer: Self-pay | Admitting: Orthopedic Surgery

## 2012-08-04 ENCOUNTER — Other Ambulatory Visit (HOSPITAL_COMMUNITY): Payer: Self-pay | Admitting: Orthopedic Surgery

## 2012-08-04 ENCOUNTER — Encounter (HOSPITAL_BASED_OUTPATIENT_CLINIC_OR_DEPARTMENT_OTHER): Payer: Self-pay | Admitting: *Deleted

## 2012-08-04 DIAGNOSIS — T148XXA Other injury of unspecified body region, initial encounter: Secondary | ICD-10-CM

## 2012-08-04 DIAGNOSIS — IMO0001 Reserved for inherently not codable concepts without codable children: Secondary | ICD-10-CM | POA: Insufficient documentation

## 2012-08-05 ENCOUNTER — Ambulatory Visit (HOSPITAL_BASED_OUTPATIENT_CLINIC_OR_DEPARTMENT_OTHER): Payer: 59 | Admitting: Anesthesiology

## 2012-08-05 ENCOUNTER — Encounter (HOSPITAL_BASED_OUTPATIENT_CLINIC_OR_DEPARTMENT_OTHER): Payer: Self-pay

## 2012-08-05 ENCOUNTER — Ambulatory Visit (HOSPITAL_COMMUNITY): Payer: 59

## 2012-08-05 ENCOUNTER — Ambulatory Visit (HOSPITAL_BASED_OUTPATIENT_CLINIC_OR_DEPARTMENT_OTHER)
Admission: RE | Admit: 2012-08-05 | Discharge: 2012-08-05 | Disposition: A | Discharge status: Home / Self Care | Payer: 59 | Source: Ambulatory Visit | Attending: Orthopedic Surgery | Admitting: Orthopedic Surgery

## 2012-08-05 ENCOUNTER — Encounter (HOSPITAL_BASED_OUTPATIENT_CLINIC_OR_DEPARTMENT_OTHER): Payer: Self-pay | Admitting: Anesthesiology

## 2012-08-05 ENCOUNTER — Encounter (HOSPITAL_BASED_OUTPATIENT_CLINIC_OR_DEPARTMENT_OTHER)
Admission: RE | Disposition: A | Discharge status: Home / Self Care | Payer: Self-pay | Source: Ambulatory Visit | Attending: Orthopedic Surgery

## 2012-08-05 DIAGNOSIS — IMO0002 Reserved for concepts with insufficient information to code with codable children: Secondary | ICD-10-CM | POA: Insufficient documentation

## 2012-08-05 DIAGNOSIS — E039 Hypothyroidism, unspecified: Secondary | ICD-10-CM | POA: Insufficient documentation

## 2012-08-05 DIAGNOSIS — E119 Type 2 diabetes mellitus without complications: Secondary | ICD-10-CM | POA: Insufficient documentation

## 2012-08-05 DIAGNOSIS — W64XXXA Exposure to other animate mechanical forces, initial encounter: Secondary | ICD-10-CM | POA: Insufficient documentation

## 2012-08-05 DIAGNOSIS — Z8542 Personal history of malignant neoplasm of other parts of uterus: Secondary | ICD-10-CM | POA: Insufficient documentation

## 2012-08-05 DIAGNOSIS — E785 Hyperlipidemia, unspecified: Secondary | ICD-10-CM | POA: Insufficient documentation

## 2012-08-05 HISTORY — PX: OPEN REDUCTION INTERNAL FIXATION (ORIF) DISTAL PHALANX: SHX6236

## 2012-08-05 LAB — POCT HEMOGLOBIN-HEMACUE: Hemoglobin: 14 g/dL (ref 12.0–15.0)

## 2012-08-05 SURGERY — OPEN REDUCTION INTERNAL FIXATION (ORIF) DISTAL PHALANX
Anesthesia: Monitor Anesthesia Care | Site: Finger | Laterality: Left

## 2012-08-05 MED ORDER — BUPIVACAINE-EPINEPHRINE PF 0.5-1:200000 % IJ SOLN
INTRAMUSCULAR | Status: DC | PRN
  Administered 2012-08-05: 30 mL

## 2012-08-05 MED ORDER — OXYCODONE HCL 5 MG PO TABS: 5.0000 mg | ORAL_TABLET | Freq: Once | ORAL | Status: DC | PRN

## 2012-08-05 MED ORDER — ONDANSETRON HCL 4 MG/2ML IJ SOLN
INTRAMUSCULAR | Status: DC | PRN
  Administered 2012-08-05: 4 mg via INTRAVENOUS

## 2012-08-05 MED ORDER — FENTANYL CITRATE 0.05 MG/ML IJ SOLN
50.0000 ug | INTRAMUSCULAR | Status: DC | PRN
  Administered 2012-08-05: 100 ug via INTRAVENOUS

## 2012-08-05 MED ORDER — OXYCODONE HCL 5 MG/5ML PO SOLN: 5.0000 mg | Freq: Once | ORAL | Status: DC | PRN

## 2012-08-05 MED ORDER — CEFAZOLIN SODIUM-DEXTROSE 2-3 GM-% IV SOLR
2.0000 g | INTRAVENOUS | Status: AC
  Administered 2012-08-05: 2 g via INTRAVENOUS

## 2012-08-05 MED ORDER — HYDROMORPHONE HCL PF 1 MG/ML IJ SOLN: 0.2500 mg | INTRAMUSCULAR | Status: DC | PRN

## 2012-08-05 MED ORDER — BUPIVACAINE HCL (PF) 0.5 % IJ SOLN
INTRAMUSCULAR | Status: DC | PRN
  Administered 2012-08-05: 10 mL

## 2012-08-05 MED ORDER — PROPOFOL 10 MG/ML IV BOLUS
INTRAVENOUS | Status: DC | PRN
  Administered 2012-08-05: 30 mg via INTRAVENOUS

## 2012-08-05 MED ORDER — LACTATED RINGERS IV SOLN
INTRAVENOUS | Status: DC
  Administered 2012-08-05 (×2): via INTRAVENOUS

## 2012-08-05 MED ORDER — HYDROCODONE-ACETAMINOPHEN 5-325 MG PO TABS: ORAL_TABLET | ORAL | Status: DC

## 2012-08-05 MED ORDER — MIDAZOLAM HCL 2 MG/2ML IJ SOLN
1.0000 mg | INTRAMUSCULAR | Status: DC | PRN
  Administered 2012-08-05: 2 mg via INTRAVENOUS

## 2012-08-05 MED ORDER — PROPOFOL INFUSION 10 MG/ML OPTIME
INTRAVENOUS | Status: DC | PRN
  Administered 2012-08-05: 100 ug/kg/min via INTRAVENOUS

## 2012-08-05 MED ORDER — CHLORHEXIDINE GLUCONATE 4 % EX LIQD: 60.0000 mL | Freq: Once | CUTANEOUS | Status: DC

## 2012-08-05 SURGICAL SUPPLY — 65 items
BANDAGE ELASTIC 3 VELCRO ST LF (GAUZE/BANDAGES/DRESSINGS) ×1 IMPLANT
BANDAGE GAUZE ELAST BULKY 4 IN (GAUZE/BANDAGES/DRESSINGS) ×2 IMPLANT
BIT DRILL 1.1 MINI QC NONSTRL (BIT) ×1 IMPLANT
BIT DRILL 1.5 MINI-QUICK CON (BIT) ×1 IMPLANT
BLADE MINI RND TIP GREEN BEAV (BLADE) IMPLANT
BLADE SURG 15 STRL LF DISP TIS (BLADE) ×2 IMPLANT
BLADE SURG 15 STRL SS (BLADE) ×4
BNDG CMPR 9X4 STRL LF SNTH (GAUZE/BANDAGES/DRESSINGS) ×1
BNDG CMPR MD 5X2 ELC HKLP STRL (GAUZE/BANDAGES/DRESSINGS)
BNDG ELASTIC 2 VLCR STRL LF (GAUZE/BANDAGES/DRESSINGS) IMPLANT
BNDG ESMARK 4X9 LF (GAUZE/BANDAGES/DRESSINGS) ×1 IMPLANT
CHLORAPREP W/TINT 26ML (MISCELLANEOUS) ×2 IMPLANT
CLOTH BEACON ORANGE TIMEOUT ST (SAFETY) ×2 IMPLANT
CORDS BIPOLAR (ELECTRODE) ×2 IMPLANT
COVER MAYO STAND STRL (DRAPES) ×2 IMPLANT
COVER TABLE BACK 60X90 (DRAPES) ×2 IMPLANT
CUFF TOURNIQUET SINGLE 18IN (TOURNIQUET CUFF) ×2 IMPLANT
DRAPE EXTREMITY T 121X128X90 (DRAPE) ×2 IMPLANT
DRAPE OEC MINIVIEW 54X84 (DRAPES) ×1 IMPLANT
DRAPE SURG 17X23 STRL (DRAPES) ×2 IMPLANT
GAUZE XEROFORM 1X8 LF (GAUZE/BANDAGES/DRESSINGS) ×2 IMPLANT
GLOVE BIO SURGEON STRL SZ7.5 (GLOVE) ×2 IMPLANT
GLOVE BIOGEL PI IND STRL 8 (GLOVE) ×1 IMPLANT
GLOVE BIOGEL PI IND STRL 8.5 (GLOVE) IMPLANT
GLOVE BIOGEL PI INDICATOR 8 (GLOVE) ×1
GLOVE BIOGEL PI INDICATOR 8.5 (GLOVE) ×1
GLOVE SURG ORTHO 8.0 STRL STRW (GLOVE) ×1 IMPLANT
GOWN BRE IMP PREV XXLGXLNG (GOWN DISPOSABLE) ×3 IMPLANT
GOWN PREVENTION PLUS XLARGE (GOWN DISPOSABLE) ×2 IMPLANT
GOWN PREVENTION PLUS XXLARGE (GOWN DISPOSABLE) ×2 IMPLANT
K-WIRE 6 .028 (WIRE) ×2
KWIRE 6 .028 (WIRE) IMPLANT
NDL HYPO 25X1 1.5 SAFETY (NEEDLE) IMPLANT
NEEDLE HYPO 22GX1.5 SAFETY (NEEDLE) ×1 IMPLANT
NEEDLE HYPO 25X1 1.5 SAFETY (NEEDLE) ×2 IMPLANT
NS IRRIG 1000ML POUR BTL (IV SOLUTION) ×2 IMPLANT
PACK BASIN DAY SURGERY FS (CUSTOM PROCEDURE TRAY) ×2 IMPLANT
PAD CAST 3X4 CTTN HI CHSV (CAST SUPPLIES) IMPLANT
PAD CAST 4YDX4 CTTN HI CHSV (CAST SUPPLIES) IMPLANT
PADDING CAST ABS 4INX4YD NS (CAST SUPPLIES)
PADDING CAST ABS COTTON 4X4 ST (CAST SUPPLIES) ×1 IMPLANT
PADDING CAST COTTON 3X4 STRL (CAST SUPPLIES) ×2
PADDING CAST COTTON 4X4 STRL (CAST SUPPLIES)
SCREW 1.3X8MM (Screw) ×6 IMPLANT
SCREW 1.3X9MM (Screw) ×2 IMPLANT
SCREW BN 8X1.3XST NONLOCK (Screw) IMPLANT
SCREW BN 9X1.3XST NONLOCK (Screw) IMPLANT
SLEEVE SCD COMPRESS KNEE MED (MISCELLANEOUS) ×1 IMPLANT
SPLINT PLASTER CAST XFAST 3X15 (CAST SUPPLIES) IMPLANT
SPLINT PLASTER CAST XFAST 4X15 (CAST SUPPLIES) IMPLANT
SPLINT PLASTER XTRA FAST SET 4 (CAST SUPPLIES)
SPLINT PLASTER XTRA FASTSET 3X (CAST SUPPLIES) ×20
SPONGE GAUZE 4X4 12PLY (GAUZE/BANDAGES/DRESSINGS) ×2 IMPLANT
STOCKINETTE 4X48 STRL (DRAPES) ×2 IMPLANT
SUT CHROMIC 5 0 P 3 (SUTURE) ×1 IMPLANT
SUT ETHILON 3 0 PS 1 (SUTURE) IMPLANT
SUT ETHILON 4 0 PS 2 18 (SUTURE) ×3 IMPLANT
SUT MERSILENE 4 0 P 3 (SUTURE) ×1 IMPLANT
SUT VIC AB 3-0 PS1 18 (SUTURE)
SUT VIC AB 3-0 PS1 18XBRD (SUTURE) IMPLANT
SUT VICRYL 4-0 PS2 18IN ABS (SUTURE) IMPLANT
SYR BULB 3OZ (MISCELLANEOUS) ×2 IMPLANT
SYR CONTROL 10ML LL (SYRINGE) IMPLANT
TOWEL OR 17X24 6PK STRL BLUE (TOWEL DISPOSABLE) ×4 IMPLANT
UNDERPAD 30X30 INCONTINENT (UNDERPADS AND DIAPERS) ×2 IMPLANT

## 2012-08-05 NOTE — Anesthesia Procedure Notes (Addendum)
Anesthesia Regional Block:   Narrative:    Anesthesia Regional Block:  Supraclavicular block  Pre-Anesthetic Checklist: ,, timeout performed, Correct Patient, Correct Site, Correct Laterality, Correct Procedure, Correct Position, site marked, Risks and benefits discussed, pre-op evaluation, post-op pain management  Laterality: Left  Prep: Maximum Sterile Barrier Precautions used and chloraprep       Needles:  Injection technique: Single-shot  Needle Type: Echogenic Stimulator Needle     Needle Length: 5cm 5 cm Needle Gauge: 22 and 22 G    Additional Needles:  Procedures: ultrasound guided (picture in chart) Supraclavicular block Narrative:  Start time: 08/05/2012 11:18 AM End time: 08/05/2012 11:31 AM Injection made incrementally with aspirations every 5 mL. Anesthesiologist: Blakeley Margraf,MD  Additional Notes: 2% Lidocaine skin wheel. Intercostobrachial block with 10cc of 0.5% Bupivicaine plain.  Supraclavicular block

## 2012-08-05 NOTE — Anesthesia Preprocedure Evaluation (Addendum)
Anesthesia Evaluation  Patient identified by MRN, date of birth, ID band Patient awake    Reviewed: Allergy & Precautions, H&P , NPO status , Patient's Chart, lab work & pertinent test results  Airway Mallampati: II TM Distance: >3 FB Neck ROM: Full    Dental no notable dental hx. (+) Teeth Intact and Dental Advisory Given   Pulmonary neg pulmonary ROS,  breath sounds clear to auscultation  Pulmonary exam normal       Cardiovascular negative cardio ROS  Rhythm:Regular Rate:Normal     Neuro/Psych PSYCHIATRIC DISORDERS negative neurological ROS  negative psych ROS   GI/Hepatic negative GI ROS, Neg liver ROS,   Endo/Other  negative endocrine ROSdiabetes, Well ControlledHypothyroidism   Renal/GU negative Renal ROS  negative genitourinary   Musculoskeletal   Abdominal   Peds  Hematology negative hematology ROS (+)   Anesthesia Other Findings   Reproductive/Obstetrics negative OB ROS                         Anesthesia Physical Anesthesia Plan  ASA: II  Anesthesia Plan: Regional and MAC   Post-op Pain Management:    Induction: Intravenous  Airway Management Planned: Simple Face Mask  Additional Equipment:   Intra-op Plan:   Post-operative Plan:   Informed Consent: I have reviewed the patients History and Physical, chart, labs and discussed the procedure including the risks, benefits and alternatives for the proposed anesthesia with the patient or authorized representative who has indicated his/her understanding and acceptance.   Dental advisory given  Plan Discussed with: CRNA and Surgeon  Anesthesia Plan Comments:       Anesthesia Quick Evaluation

## 2012-08-05 NOTE — Brief Op Note (Signed)
08/05/2012  1:54 PM  PATIENT:  Dorisann Frames  57 y.o. female  PRE-OPERATIVE DIAGNOSIS:  LEFT RING PHALANX FRACTURE  POST-OPERATIVE DIAGNOSIS:  Left Ring Phalangeal Fracture  PROCEDURE:  Procedure(s): OPEN REDUCTION INTERNAL FIXATION (ORIF) DISTAL PHALANX VERSUS EXTERNAL FIXATION LEFT RING FINGER (Left)  SURGEON:  Surgeon(s) and Role:    * Tami Ribas, MD - Primary    * Nicki Reaper, MD - Assisting  PHYSICIAN ASSISTANT:   ASSISTANTS: Cindee Salt, MD   ANESTHESIA:   regional and MAC  EBL:  Total I/O In: 1000 [I.V.:1000] Out: -   BLOOD ADMINISTERED:none  DRAINS: none   LOCAL MEDICATIONS USED:  NONE  SPECIMEN:  No Specimen  DISPOSITION OF SPECIMEN:  N/A  COUNTS:  YES  TOURNIQUET:   Total Tourniquet Time Documented: Upper Arm (Left) - 81 minutes Total: Upper Arm (Left) - 81 minutes   DICTATION: .Other Dictation: Dictation Number [id number]  PLAN OF CARE: Discharge to home after PACU  PATIENT DISPOSITION:  PACU - hemodynamically stable.

## 2012-08-05 NOTE — ED Provider Notes (Signed)
Medical screening examination/treatment/procedure(s) were performed by non-physician practitioner and as supervising physician I was immediately available for consultation/collaboration.    Makaia Rappa R Stoy Fenn, MD 08/05/12 2249 

## 2012-08-05 NOTE — Progress Notes (Signed)
Assisted Dr. Fitzgerald with left, ultrasound guided, interscalene  block. Side rails up, monitors on throughout procedure. See vital signs in flow sheet. Tolerated Procedure well. 

## 2012-08-05 NOTE — Anesthesia Postprocedure Evaluation (Signed)
  Anesthesia Post-op Note  Patient: Cheryl Chan  Procedure(s) Performed: Procedure(s): OPEN REDUCTION INTERNAL FIXATION (ORIF) DISTAL PHALANX VERSUS EXTERNAL FIXATION LEFT RING FINGER (Left)  Patient Location: PACU  Anesthesia Type:MAC combined with regional for post-op pain  Level of Consciousness: awake and alert   Airway and Oxygen Therapy: Patient Spontanous Breathing  Post-op Pain: none  Post-op Assessment: Post-op Vital signs reviewed, Patient's Cardiovascular Status Stable, Respiratory Function Stable, Patent Airway and No signs of Nausea or vomiting  Post-op Vital Signs: Reviewed and stable  Complications: No apparent anesthesia complications

## 2012-08-05 NOTE — Op Note (Signed)
[id number] 

## 2012-08-05 NOTE — H&P (Signed)
Cheryl Chan is an 57 y.o. female.   Chief Complaint: left ring finger fracture HPI: 57 yo rhd female states her dog pulled on leash injuring left ring finger 08/03/12.  Seen at [facility] where XR revealed left ring finger middle phalanx fracture.  CT confirms fracture with two major fragments and one butterfly fragment.    Past Medical History  Diagnosis Date  . Hypothyroidism   . Hyperlipidemia   . Arthritis   . 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 History of chicken pox   . 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 Endometrial cancer 10/2011 dx    s/p vag hyst 11/2011  . Diet-controlled type 2 diabetes mellitus     Past Surgical History  Procedure Laterality Date  . Cesarean section      x's 4  . Fracture surgery      nose as a child  . Dilation and curettage of uterus    . Robotic assisted lap vaginal hysterectomy  12/04/2011    Procedure: ROBOTIC ASSISTED LAPAROSCOPIC VAGINAL HYSTERECTOMY;  Surgeon: Rejeana Brock A. Duard Brady, MD;  Location: [facility];  Service: Gynecology;  Laterality: N/A;    Family History  Problem Relation Age of Onset  . Heart disease Brother 63    cardiomyopathy s/p ICD, on transplant list  . Cancer Father 59    prostrate  . Hypertension Father   . Arthritis Mother    Social History:  reports that she has never smoked. She has never used smokeless tobacco. She reports that  drinks alcohol. She reports that she does not use illicit drugs.  Allergies:  Allergies  Allergen Reactions  . Crestor (Rosuvastatin Calcium)     Muscle ache  . Statins     No prescriptions prior to admission    No results found for this or any previous visit (from the past 48 hour(s)).  Ct Hand Left Wo Contrast  08/04/2012  *RADIOLOGY REPORT*  Clinical Data: Evaluate ring finger fracture.  CT OF THE LEFT HAND WITHOUT CONTRAST  Technique:  Multidetector CT imaging was performed according to the standard protocol. Multiplanar CT image reconstructions were also generated.   Comparison: Radiographs 08/03/2012.  Findings: There is a comminuted spiral type fracture involving the middle phalanx of the ring finger with minimal dorsal displacement. No involvement of the proximal or distal interphalangeal joints.  A small dorsal and lateral avulsion fracture is suspected involving the proximal aspect of the middle phalanx of the fifth digit as seen on the x-ray    Mild DIP and PIP joint degenerative changes are noted involving the fingers and the carpal-metacarpal joint of the thumb.  IMPRESSION:  1.  Comminuted spiral type fracture involving the middle phalanx of the ring finger without significant displacement.  No intra- articular involvement. 2.  Tiny dorsolateral avulsion fracture involving the fifth middle phalanx.   Original Report Authenticated By: Rudie Meyer, M.D.    Dg Hand Complete Left  08/03/2012  *RADIOLOGY REPORT*  Clinical Data: Hand injury and pain.  LEFT HAND - COMPLETE 3+ VIEW  Comparison: None.  Findings: A comminuted nondisplaced fracture is seen involving the middle phalanx of the ring finger.  The volar plate avulsion fracture is also seen arising from the middle phalanx of the little finger at the proximal interphalangeal joint.  No other fractures are identified.  No evidence of dislocation.  IMPRESSION:  1.  Comminuted, nondisplaced fracture of the middle phalanx of the ring finger. 2.  Volar plate avulsion fracture from the middle phalanx of the little finger at the PIP joint.   Original Report Authenticated By: Myles Rosenthal, M.D.      A comprehensive review of systems was negative except for: Eyes: positive for contacts/glasses Hematologic/lymphatic: positive for easy bruising  Height 5\' 2"  (1.575 m), weight 71.668 kg (158 lb).  General appearance: alert, cooperative and appears stated age Head: Normocephalic, without obvious abnormality, atraumatic Neck: supple, symmetrical, trachea midline Resp: clear to auscultation bilaterally Cardio: regular  rate and rhythm GI: non tender Extremities: intact sensation and capillary refill all digits.  +epl/fpl/io.  ecchymosis left ring finger.  scissoring of ring under long when making fist. Pulses: 2+ and symmetric Skin: as above Neurologic: Grossly normal Incision/Wound: na  Assessment/Plan Left ring finger middle phalanx fracture.  Non operative and operative treatment options were discussed with the patient and patient wishes to proceed with operative treatment. Risks, benefits, and alternatives of surgery were discussed and the patient agrees with the plan of care.   Aarib Pulido R 08/05/2012, 9:55 AM

## 2012-08-05 NOTE — Transfer of Care (Signed)
Immediate Anesthesia Transfer of Care Note  Patient: Cheryl Chan  Procedure(s) Performed: Procedure(s): OPEN REDUCTION INTERNAL FIXATION (ORIF) DISTAL PHALANX VERSUS EXTERNAL FIXATION LEFT RING FINGER (Left)  Patient Location: PACU  Anesthesia Type:MAC combined with regional for post-op pain  Level of Consciousness: awake, alert  and oriented  Airway & Oxygen Therapy: Patient Spontanous Breathing and Patient connected to face mask oxygen  Post-op Assessment: Report given to PACU RN and Post -op Vital signs reviewed and stable  Post vital signs: Reviewed and stable  Complications: No apparent anesthesia complications

## 2012-08-06 ENCOUNTER — Encounter (HOSPITAL_BASED_OUTPATIENT_CLINIC_OR_DEPARTMENT_OTHER): Payer: Self-pay | Admitting: Orthopedic Surgery

## 2012-08-06 NOTE — Op Note (Signed)
NAME:  Cheryl Chan, TINDOL NO.:  [id number]  MEDICAL RECORD NO.:  [id number]  LOCATION:                                 FACILITY:  PHYSICIAN:  Betha Loa, MD        DATE OF BIRTH:  November 21, 1955  DATE OF PROCEDURE:  08/05/2012 DATE OF DISCHARGE:                              OPERATIVE REPORT   PREOPERATIVE DIAGNOSIS:  Left ring finger, middle phalanx fracture.  POSTOPERATIVE DIAGNOSIS:  Left ring finger, middle phalanx fracture.  PROCEDURE:  Open reduction and internal fixation of left ring finger, middle phalangeal fracture.  SURGEON:  Betha Loa, MD  ASSISTANT:  Cindee Salt, MD.  ANESTHESIA:  General regional with sedation.  IV FLUIDS:  Per anesthesia flow sheet.  ESTIMATED BLOOD LOSS:  Minimal.  COMPLICATIONS:  None.  SPECIMENS:  None.  TOURNIQUET TIME:  81 minute.  DISPOSITION:  Stable to PACU.  INDICATIONS:  Cheryl Chan is a 57 year old female who injured her left ring finger when her dog pulled on the leash 2 days ago.  She was seen at [facility] Emergency [facility] where radiographs were taken revealing a middle phalangeal fracture of the ring finger.  She followed up with me in the office.  A CT was ordered to further characterize the fracture.  It was felt that open reduction and internal fixation would be appropriate.  Risks, benefits, and alternatives of surgery were discussed including the risk of blood loss, infection, damage to nerves, vessels, tendons, ligaments, bone; failure of surgery; need for additional surgery, complications with wound healing, continued pain, nonunion, malunion, stiffness.  She voiced understanding of these risks and elected to proceed.  OPERATIVE COURSE:  After being identified preoperatively by myself, the patient and I agreed upon procedure and site of procedure.  Surgical site was marked.  The risks, benefits, and alternatives of surgery were reviewed and she wished to proceed.  Surgical consent had been  signed. She was given IV Ancef as preoperative antibiotic prophylaxis.  Regional block was performed by anesthesia in preoperative holding.  She was transferred to the operating room and placed on the operating room table in supine position with the left upper extremity on arm board.  Sedation was induced by the anesthesiologist.  Left upper extremity was prepped and draped in normal sterile orthopedic fashion.  Surgical pause was performed between surgeons, anesthesia, and operating staff, and all were in agreement as to the patient, procedure, and site of procedure. Tourniquet at the proximal aspect of the extremity was inflated to 250 mmHg after exsanguination of the limb with Esmarch bandage.  A longitudinal incision was made on the dorsum of the ring finger over the middle phalanx.  This was carried into the subcutaneous tissues by spreading technique.  Bipolar electrocautery was used to obtain hemostasis.  The extensor tendon and periosteum were sharply incised and elevated.  The fracture site was identified.  It was cleared of hematoma.  It was reduced under direct visualization.  Two 0.028 inch K- wires were then used for provisional stabilization.  C-arm was used in AP and lateral projections to ensure appropriate reduction, which was felt to be the case.  The wrist was placed through tenodesis and the scissoring of the finger underneath the long finger had been corrected. Standard AO drilling and measuring technique was then used to place three 1.3 mm screws in a dorsal to volar fashion.  Good purchase was obtained in the distal 2 screws and adequate purchase obtained in the proximal screw.  The C-arm was used again in AP and lateral projections to ensure appropriate reduction and position of hardware which was the case.  The wrist was again placed through tenodesis and the scissoring had been corrected.  The wound was copiously irrigated with sterile saline.  Periosteum was  repaired with 5-0 chromic suture in a figure-of- eight fashion.  The extensor tendon was repaired with 4-0 [place]ilene in a running and figure-of-eight fashion and the skin was closed with 4-0 nylon in a horizontal mattress fashion.  The wound was dressed sterile Xeroform, 4x4s, and wrapped with a Kerlix bandage.  A volar and dorsal slab splint including the long, ring, and small fingers with the MPs flexed and the IPs extended was placed and wrapped with Kerlix and Ace bandage.  Tourniquet was deflated to 81 minutes.  Fingertips were pink with brisk capillary refill after deflation of tourniquet.  Operative drapes were broken down and the patient was awoken from anesthesia safely.  She was transferred back to stretcher and taken to PACU in stable condition.  I will give her Norco 5/325 one to two p.o. q.6 hours p.r.n. pain, dispensed #40.  I will see her back in the office 1 week for postoperative followup.     Betha Loa, MD     KK/MEDQ  D:  08/05/2012  T:  08/06/2012  Job:  [id number]

## 2012-09-17 ENCOUNTER — Telehealth: Payer: Self-pay | Admitting: *Deleted

## 2012-09-17 DIAGNOSIS — E119 Type 2 diabetes mellitus without complications: Secondary | ICD-10-CM

## 2012-09-17 DIAGNOSIS — Z0000 Encounter for general adult medical examination without abnormal findings: Secondary | ICD-10-CM

## 2012-09-17 NOTE — Telephone Encounter (Signed)
Left msg on vm made cpx for May 12. Wanting labs done prior to appt. Called pt back confirm what type of insurance. Pt has [employer]. Inform her will enter cpx labs...Cheryl Chan

## 2012-09-23 ENCOUNTER — Ambulatory Visit (INDEPENDENT_AMBULATORY_CARE_PROVIDER_SITE_OTHER): Payer: 59

## 2012-09-23 ENCOUNTER — Other Ambulatory Visit: Payer: Self-pay | Admitting: Internal Medicine

## 2012-09-23 DIAGNOSIS — Z0000 Encounter for general adult medical examination without abnormal findings: Secondary | ICD-10-CM

## 2012-09-23 DIAGNOSIS — E119 Type 2 diabetes mellitus without complications: Secondary | ICD-10-CM

## 2012-09-23 LAB — CBC WITH DIFFERENTIAL/PLATELET
Basophils Relative: 0.4 % (ref 0.0–3.0)
Eosinophils Absolute: 0.1 10*3/uL (ref 0.0–0.7)
HCT: 37.2 % (ref 36.0–46.0)
Hemoglobin: 12.8 g/dL (ref 12.0–15.0)
Lymphs Abs: 0.8 10*3/uL (ref 0.7–4.0)
MCHC: 34.3 g/dL (ref 30.0–36.0)
MCV: 87.4 fl (ref 78.0–100.0)
Monocytes Absolute: 0.4 10*3/uL (ref 0.1–1.0)
Neutro Abs: 1.8 10*3/uL (ref 1.4–7.7)
RBC: 4.26 Mil/uL (ref 3.87–5.11)

## 2012-09-23 LAB — LIPID PANEL
Cholesterol: 214 mg/dL — ABNORMAL HIGH (ref 0–200)
Total CHOL/HDL Ratio: 4

## 2012-09-23 LAB — BASIC METABOLIC PANEL
CO2: 29 mEq/L (ref 19–32)
Chloride: 104 mEq/L (ref 96–112)
Creatinine, Ser: 0.6 mg/dL (ref 0.4–1.2)
Potassium: 4.1 mEq/L (ref 3.5–5.1)
Sodium: 138 mEq/L (ref 135–145)

## 2012-09-23 LAB — HEPATIC FUNCTION PANEL
Bilirubin, Direct: 0.1 mg/dL (ref 0.0–0.3)
Total Bilirubin: 0.9 mg/dL (ref 0.3–1.2)
Total Protein: 6.8 g/dL (ref 6.0–8.3)

## 2012-09-23 LAB — URINALYSIS, ROUTINE W REFLEX MICROSCOPIC
Bilirubin Urine: NEGATIVE
Hgb urine dipstick: NEGATIVE
Total Protein, Urine: NEGATIVE
Urine Glucose: NEGATIVE
pH: 7 (ref 5.0–8.0)

## 2012-09-23 LAB — HEMOGLOBIN A1C: Hgb A1c MFr Bld: 6.5 % (ref 4.6–6.5)

## 2012-09-24 LAB — LDL CHOLESTEROL, DIRECT: Direct LDL: 148.7 mg/dL

## 2012-09-29 ENCOUNTER — Ambulatory Visit (INDEPENDENT_AMBULATORY_CARE_PROVIDER_SITE_OTHER): Payer: 59 | Admitting: Internal Medicine

## 2012-09-29 ENCOUNTER — Encounter: Payer: Self-pay | Admitting: Internal Medicine

## 2012-09-29 VITALS — BP 110/62 | HR 81 | Temp 98.1°F | Ht 62.0 in | Wt 152.8 lb

## 2012-09-29 DIAGNOSIS — E039 Hypothyroidism, unspecified: Secondary | ICD-10-CM

## 2012-09-29 DIAGNOSIS — Z298 Encounter for other specified prophylactic measures: Secondary | ICD-10-CM

## 2012-09-29 DIAGNOSIS — Z0000 Encounter for general adult medical examination without abnormal findings: Secondary | ICD-10-CM

## 2012-09-29 DIAGNOSIS — E119 Type 2 diabetes mellitus without complications: Secondary | ICD-10-CM

## 2012-09-29 DIAGNOSIS — E785 Hyperlipidemia, unspecified: Secondary | ICD-10-CM

## 2012-09-29 DIAGNOSIS — IMO0002 Reserved for concepts with insufficient information to code with codable children: Secondary | ICD-10-CM

## 2012-09-29 MED ORDER — ACETAZOLAMIDE 125 MG PO TABS: 250.0000 mg | ORAL_TABLET | Freq: Two times a day (BID) | ORAL | Status: DC

## 2012-09-29 NOTE — Progress Notes (Signed)
Subjective:    Patient ID: Cheryl Chan, female    DOB: [birth year], 57 y.o.   MRN: [id number]  HPI  patient is here today for annual physical. Patient feels well overall -  Also reviewed chronic medical issues:   DM2, diet controlled since 2010 - hx gestational DM requiring insulin x 2 preg, stopped metformin 2010 due to a1c <6. Follows careful diet and exercise program with close attention to weight  Dyslipidemia - intolerant of statins - has tried several in past - also intol of WelChol - follows careful diet/exercise program to control weight to manage same  Hypothyroid - prefers Armour - the patient reports compliance with medication(s) as prescribed. Denies adverse side effects.  Past Medical History  Diagnosis Date  . Hypothyroidism   . Hyperlipidemia   . Arthritis   . 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 History of chicken pox   . 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 Endometrial cancer 10/2011 dx    s/p vag hyst 11/2011  . Diet-controlled type 2 diabetes mellitus     Family History  Problem Relation Age of Onset  . Heart disease Brother 78    cardiomyopathy s/p ICD, on transplant list  . Cancer Father 70    prostrate  . Hypertension Father   . Arthritis Mother    History  Substance Use Topics  . Smoking status: Never Smoker   . Smokeless tobacco: Never Used  . Alcohol Use: 0.0 oz/week     Comment: 1 beer weekly    Review of Systems Constitutional: Negative for fever or weight change.  Respiratory: Negative for cough and shortness of breath.   Cardiovascular: Negative for chest pain or palpitations.  Gastrointestinal: Negative for abdominal pain, no bowel changes.  Musculoskeletal: Negative for gait problem or joint swelling.  Skin: Negative for rash.  Neurological: Negative for dizziness or headache.  No other specific complaints in a complete review of systems (except as listed in HPI above).      Objective:   Physical Exam  BP  110/62  Pulse 81  Temp(Src) 98.1 F (36.7 C) (Oral)  Ht 5\' 2"  (1.575 m)  Wt 152 lb 12.8 oz (69.31 kg)  BMI 27.94 kg/m2  SpO2 98% Wt Readings from Last 3 Encounters:  09/29/12 152 lb 12.8 oz (69.31 kg)  08/05/12 160 lb (72.576 kg)  08/05/12 160 lb (72.576 kg)   Constitutional: She appears well-developed and well-nourished. No distress.  HENT: Head: Normocephalic and atraumatic. Ears: B TMs ok, no erythema or effusion; Nose: Nose normal. Mouth/Throat: Oropharynx is clear and moist. No oropharyngeal exudate.  Eyes: Conjunctivae and EOM are normal. Pupils are equal, round, and reactive to light. No scleral icterus.  Neck: Normal range of motion. Neck supple. No JVD present. No thyromegaly present.  Cardiovascular: Normal rate, regular rhythm and normal heart sounds.  No murmur heard. No BLE edema. Pulmonary/Chest: Effort normal and breath sounds normal. No respiratory distress. She has no wheezes.  Abdominal: Soft. Bowel sounds are normal. She exhibits no distension. There is no tenderness. no masses Musculoskeletal: Normal range of motion, no joint effusions. No gross deformities Neurological: She is alert and oriented to person, place, and time. No cranial nerve deficit. Coordination, balance, strength, speech and gait are normal.  Skin: Skin is warm and dry. No rash noted. No erythema.  Psychiatric: She has a normal mood and affect. Her behavior is normal. Judgment and thought content normal.  Lab Results  Component Value Date   WBC 3.1* 09/23/2012   HGB 12.8 09/23/2012   HCT 37.2 09/23/2012   PLT 248.0 09/23/2012   GLUCOSE 123* 09/23/2012   CHOL 214* 09/23/2012   TRIG 56.0 09/23/2012   HDL 55.90 09/23/2012   LDLDIRECT 148.7 09/23/2012   ALT 15 09/23/2012   AST 18 09/23/2012   NA 138 09/23/2012   K 4.1 09/23/2012   CL 104 09/23/2012   CREATININE 0.6 09/23/2012   BUN 14 09/23/2012   CO2 29 09/23/2012   TSH 2.87 09/23/2012   HGBA1C 6.5 09/23/2012   MICROALBUR 0.2 09/10/2011   ECG: sinus @ 85 - no ischemic  changes    Assessment & Plan:   CPX/v70.0 - Patient has been counseled on age-appropriate routine health concerns for screening and prevention. These are reviewed and up-to-date. Immunizations are up-to-date or declined. Labs and ECG reviewed.  Also see problem list. Medications and labs reviewed today.  Planned travel to high altitude Cytogeneticist) - empiric tx with acetazolamide per request, but education provided re: prevention and risks

## 2012-09-29 NOTE — Assessment & Plan Note (Signed)
Hx gestational DM Previously on metformin - none since 2010 Check a1c q 23mo to monitor Encouraged continued weight control, diet and exercsie Lab Results  Component Value Date   HGBA1C 6.5 09/23/2012

## 2012-09-29 NOTE — Assessment & Plan Note (Signed)
On Armour Check TSH q 92mo and adjust dose if needed The current medical regimen is effective;  continue present plan and medications. Lab Results  Component Value Date   TSH 2.87 09/23/2012

## 2012-09-29 NOTE — Assessment & Plan Note (Signed)
Intolerant of statins - previous trials of Crestor, lipitor, Vytorin all caused extreme myalgia, fatigue and memory problems controls with diet/exercise and weight control Check annually to monitor same

## 2012-09-29 NOTE — Patient Instructions (Signed)
It was good to see you today. We have reviewed your prior records including labs and tests today Health Maintenance reviewed - all recommended immunizations and age-appropriate screenings are up-to-date. Medications reviewed and updated, no changes recommended at this time. Acetazolamide as discussed for prevention of altitude illness - Your prescription(s) have been submitted to your pharmacy. Please take as directed and contact our office if you believe you are having problem(s) with the medication(s). Please schedule followup in 6 months for a1c and TSH check, call sooner if problems.  Health Maintenance, Females A healthy lifestyle and preventative care can promote health and wellness.  Maintain regular health, dental, and eye exams.  Eat a healthy diet. Foods like vegetables, fruits, whole grains, low-fat dairy products, and lean protein foods contain the nutrients you need without too many calories. Decrease your intake of foods high in solid fats, added sugars, and salt. Get information about a proper diet from your caregiver, if necessary.  Regular physical exercise is one of the most important things you can do for your health. Most adults should get at least 150 minutes of moderate-intensity exercise (any activity that increases your heart rate and causes you to sweat) each week. In addition, most adults need muscle-strengthening exercises on 2 or more days a week.   Maintain a healthy weight. The body mass index (BMI) is a screening tool to identify possible weight problems. It provides an estimate of body fat based on height and weight. Your caregiver can help determine your BMI, and can help you achieve or maintain a healthy weight. For adults 20 years and older:  A BMI below 18.5 is considered underweight.  A BMI of 18.5 to 24.9 is normal.  A BMI of 25 to 29.9 is considered overweight.  A BMI of 30 and above is considered obese.  Maintain normal blood lipids and cholesterol  by exercising and minimizing your intake of saturated fat. Eat a balanced diet with plenty of fruits and vegetables. Blood tests for lipids and cholesterol should begin at age 45 and be repeated every 5 years. If your lipid or cholesterol levels are high, you are over 50, or you are a high risk for heart disease, you may need your cholesterol levels checked more frequently.Ongoing high lipid and cholesterol levels should be treated with medicines if diet and exercise are not effective.  If you smoke, find out from your caregiver how to quit. If you do not use tobacco, do not start.  If you are pregnant, do not drink alcohol. If you are breastfeeding, be very cautious about drinking alcohol. If you are not pregnant and choose to drink alcohol, do not exceed 1 drink per day. One drink is considered to be 12 ounces (355 mL) of beer, 5 ounces (148 mL) of wine, or 1.5 ounces (44 mL) of liquor.  Avoid use of street drugs. Do not share needles with anyone. Ask for help if you need support or instructions about stopping the use of drugs.  High blood pressure causes heart disease and increases the risk of stroke. Blood pressure should be checked at least every 1 to 2 years. Ongoing high blood pressure should be treated with medicines, if weight loss and exercise are not effective.  If you are 55 to 57 years old, ask your caregiver if you should take aspirin to prevent strokes.  Diabetes screening involves taking a blood sample to check your fasting blood sugar level. This should be done once every 3 years, after age 49,  if you are within normal weight and without risk factors for diabetes. Testing should be considered at a younger age or be carried out more frequently if you are overweight and have at least 1 risk factor for diabetes.  Breast cancer screening is essential preventative care for women. You should practice "breast self-awareness." This means understanding the normal appearance and feel of your  breasts and may include breast self-examination. Any changes detected, no matter how small, should be reported to a caregiver. Women in their 22s and 30s should have a clinical breast exam (CBE) by a caregiver as part of a regular health exam every 1 to 3 years. After age 94, women should have a CBE every year. Starting at age 24, women should consider having a mammogram (breast X-ray) every year. Women who have a family history of breast cancer should talk to their caregiver about genetic screening. Women at a high risk of breast cancer should talk to their caregiver about having an MRI and a mammogram every year.  The Pap test is a screening test for cervical cancer. Women should have a Pap test starting at age 77. Between ages 52 and 72, Pap tests should be repeated every 2 years. Beginning at age 53, you should have a Pap test every 3 years as long as the past 3 Pap tests have been normal. If you had a hysterectomy for a problem that was not cancer or a condition that could lead to cancer, then you no longer need Pap tests. If you are between ages 60 and 75, and you have had normal Pap tests going back 10 years, you no longer need Pap tests. If you have had past treatment for cervical cancer or a condition that could lead to cancer, you need Pap tests and screening for cancer for at least 20 years after your treatment. If Pap tests have been discontinued, risk factors (such as a new sexual partner) need to be reassessed to determine if screening should be resumed. Some women have medical problems that increase the chance of getting cervical cancer. In these cases, your caregiver may recommend more frequent screening and Pap tests.  The human papillomavirus (HPV) test is an additional test that may be used for cervical cancer screening. The HPV test looks for the virus that can cause the cell changes on the cervix. The cells collected during the Pap test can be tested for HPV. The HPV test could be used to  screen women aged 41 years and older, and should be used in women of any age who have unclear Pap test results. After the age of 31, women should have HPV testing at the same frequency as a Pap test.  Colorectal cancer can be detected and often prevented. Most routine colorectal cancer screening begins at the age of 41 and continues through age 15. However, your caregiver may recommend screening at an earlier age if you have risk factors for colon cancer. On a yearly basis, your caregiver may provide home test kits to check for hidden blood in the stool. Use of a small camera at the end of a tube, to directly examine the colon (sigmoidoscopy or colonoscopy), can detect the earliest forms of colorectal cancer. Talk to your caregiver about this at age 40, when routine screening begins. Direct examination of the colon should be repeated every 5 to 10 years through age 58, unless early forms of pre-cancerous polyps or small growths are found.  Hepatitis C blood testing is recommended for  all people born from [birth year] through 1965 and any individual with known risks for hepatitis C.  Practice safe sex. Use condoms and avoid high-risk sexual practices to reduce the spread of sexually transmitted infections (STIs). Sexually active women aged 82 and younger should be checked for Chlamydia, which is a common sexually transmitted infection. Older women with new or multiple partners should also be tested for Chlamydia. Testing for other STIs is recommended if you are sexually active and at increased risk.  Osteoporosis is a disease in which the bones lose minerals and strength with aging. This can result in serious bone fractures. The risk of osteoporosis can be identified using a bone density scan. Women ages 15 and over and women at risk for fractures or osteoporosis should discuss screening with their caregivers. Ask your caregiver whether you should be taking a calcium supplement or vitamin D to reduce the rate of  osteoporosis.  Menopause can be associated with physical symptoms and risks. Hormone replacement therapy is available to decrease symptoms and risks. You should talk to your caregiver about whether hormone replacement therapy is right for you.  Use sunscreen with a sun protection factor (SPF) of 30 or greater. Apply sunscreen liberally and repeatedly throughout the day. You should seek shade when your shadow is shorter than you. Protect yourself by wearing long sleeves, pants, a wide-brimmed hat, and sunglasses year round, whenever you are outdoors.  Notify your caregiver of new moles or changes in moles, especially if there is a change in shape or color. Also notify your caregiver if a mole is larger than the size of a pencil eraser.  Stay current with your immunizations. Document Released: 11/20/2010 Document Revised: 07/30/2011 Document Reviewed: 11/20/2010 [facility] Patient Information 2013 [place], [place]. Altitude Sickness Altitude sickness happens when you go to a high place without giving your body time to adjust. This is a medical emergency. Altitude sickness can be a life-threatening problem. HOME CARE  If you must exercise, do so lightly for the first 24 to 36 hours after treatment.  Drink enough fluids to keep your pee (urine) clear or pale yellow.  Eat small, light meals.  Avoid smoking, calming medicines (sedatives), and alcohol.  Stay in low places.  Have someone stay with you until you feel normal.  Keep all doctor visits as told. To prevent altitude sickness during future trips:  Go to high places slowly.  Go to high places during the day and return to low places at night.  Give your body a few days to adjust in high places before doing physical activities.  Ask your doctor about medicines you can take to prevent altitude sickness. GET HELP RIGHT AWAY IF:  You feel short of breath at rest or when you are active.  You have chest pain or tightness.  You have a  fast heartbeat.  You have a severe headache.  You have a severe cough.  You have trouble walking.  You have trouble concentrating.  You feel confused. MAKE SURE YOU:  Understand these instructions.  Will watch your condition.  Will get help right away if you are not doing well or get worse. Document Released: 07/30/2011 Document Reviewed: 07/30/2011 [facility] Patient Information 2013 [place], [place].

## 2012-10-27 ENCOUNTER — Ambulatory Visit (INDEPENDENT_AMBULATORY_CARE_PROVIDER_SITE_OTHER): Payer: 59 | Admitting: Nurse Practitioner

## 2012-10-27 ENCOUNTER — Telehealth: Payer: Self-pay | Admitting: Nurse Practitioner

## 2012-10-27 ENCOUNTER — Encounter: Payer: Self-pay | Admitting: Nurse Practitioner

## 2012-10-27 VITALS — BP 108/70 | HR 66 | Resp 12 | Ht 63.0 in | Wt 152.4 lb

## 2012-10-27 DIAGNOSIS — N907 Vulvar cyst: Secondary | ICD-10-CM

## 2012-10-27 DIAGNOSIS — N9489 Other specified conditions associated with female genital organs and menstrual cycle: Secondary | ICD-10-CM

## 2012-10-27 MED ORDER — CEPHALEXIN 500 MG PO CAPS: 500.0000 mg | ORAL_CAPSULE | Freq: Two times a day (BID) | ORAL | Status: DC

## 2012-10-27 NOTE — Progress Notes (Signed)
Subjective:     Patient ID: Cheryl Chan, female   DOB: 06/04/1955, 57 y.o.   MRN: [id number]  HPI This 57 yo MW Fe.  Symptoms of left vulvar cyst area for about 2 days. Now has an appearance of a head at the site. Denies urinary symptoms.  She did travel recently and had GI symptoms when she returned but that is all gone now.  No fever and chills.  Review of Systems  Constitutional: Negative.  Negative for fever, chills and fatigue.  Respiratory: Negative.   Cardiovascular: Negative.   Gastrointestinal:       Recent GI symptoms from travel but all gone now.  Genitourinary: Negative.  Negative for dysuria, urgency, frequency, hematuria, flank pain, vaginal bleeding, vaginal discharge, difficulty urinating, genital sores and pelvic pain.  Musculoskeletal: Negative.   Neurological: Negative.   Hematological: Negative.   Psychiatric/Behavioral: Negative.        Objective:   Physical Exam  Constitutional: She appears well-developed and well-nourished.  Abdominal: Soft. She exhibits no distension and no mass. There is no tenderness. There is no rebound and no guarding.  Genitourinary:  Left vulva there is a small sebaceous cyst about 2 mm in size with minimal exudate or tenderness. No pain and area appears to be drying.       Assessment:     Sebaceous cyst left vulva    Plan:     Warm moist compresses prn and Keflex 500 mg for next 3 days.  If not completely gone continue with antibiotics.  If symptoms worsen to call back.

## 2012-10-27 NOTE — Telephone Encounter (Signed)
Rx was sent again to Pharmacy Keflex bid x 7 days instead of qid x 7days cm

## 2012-10-27 NOTE — Telephone Encounter (Signed)
Chasta from [facility] called regarding patient's prescription for keflex. She stated that there are two sets of directions for the medication, and she needs clarification before filling the medication.

## 2012-10-27 NOTE — Patient Instructions (Signed)

## 2012-10-28 NOTE — Progress Notes (Signed)
Reviewed personally.  MSM 

## 2012-11-17 ENCOUNTER — Other Ambulatory Visit: Payer: Self-pay | Admitting: Internal Medicine

## 2012-11-26 ENCOUNTER — Encounter: Payer: Self-pay | Admitting: Internal Medicine

## 2013-02-02 ENCOUNTER — Ambulatory Visit (INDEPENDENT_AMBULATORY_CARE_PROVIDER_SITE_OTHER): Payer: 59 | Admitting: Obstetrics & Gynecology

## 2013-02-02 ENCOUNTER — Encounter: Payer: Self-pay | Admitting: Obstetrics & Gynecology

## 2013-02-02 VITALS — BP 138/78 | HR 60 | Resp 16 | Ht 62.75 in | Wt 153.6 lb

## 2013-02-02 DIAGNOSIS — E2839 Other primary ovarian failure: Secondary | ICD-10-CM

## 2013-02-02 DIAGNOSIS — Z124 Encounter for screening for malignant neoplasm of cervix: Secondary | ICD-10-CM

## 2013-02-02 DIAGNOSIS — Z01419 Encounter for gynecological examination (general) (routine) without abnormal findings: Secondary | ICD-10-CM

## 2013-02-02 NOTE — Progress Notes (Signed)
57 y.o. Cheryl Chan MarriedCaucasianF here for annual exam.  No vaginal bleeding.  Doing really well. Now on every 6 month visits.  Here for annual exam.      Patient's last menstrual period was 05/22/2003.          Sexually active: no  The current method of family planning is vasectomy.    Exercising: yes  walking Smoker:  no  Health Maintenance: Pap:  6 months ago with Dr Duard Brady History of abnormal Pap:  yes MMG:  07/24/12 normal Colonoscopy:  2008 repeat in 10 years BMD:   12/09 TDaP:  3/14 Screening Labs: PCP, Hb today: PCP, Urine today: PCP   reports that she has never smoked. She has never used smokeless tobacco. She reports that she drinks about 0.5 ounces of alcohol per week. She reports that she does not use illicit drugs.  Past Medical History  Diagnosis Date  . Hypothyroidism   . Hyperlipidemia   . Arthritis   . 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 History of chicken pox   . 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 Endometrial cancer 10/2011 dx    s/p vag hyst 11/2011  . Diet-controlled type 2 diabetes mellitus   . Complex endometrial hyperplasia 2005    Past Surgical History  Procedure Laterality Date  . Cesarean section      x's 4  . Fracture surgery      nose as a child  . Dilation and curettage of uterus      hysteroscopy  . Robotic assisted lap vaginal hysterectomy  12/04/2011    Procedure: ROBOTIC ASSISTED LAPAROSCOPIC VAGINAL HYSTERECTOMY;  Surgeon: Rejeana Brock A. Duard Brady, MD;  Location: [facility];  Service: Gynecology;  Laterality: N/A;  . Open reduction internal fixation (orif) distal phalanx Left 08/05/2012    Procedure: OPEN REDUCTION INTERNAL FIXATION (ORIF) DISTAL PHALANX VERSUS EXTERNAL FIXATION LEFT RING FINGER;  Surgeon: Tami Ribas, MD;  Location: [facility];  Service: Orthopedics;  Laterality: Left;    Current Outpatient Prescriptions  Medication Sig Dispense Refill  . ARMOUR THYROID 90 MG tablet TAKE 1 TABLET BY MOUTH DAILY.  90  tablet  2  . fluticasone (FLONASE) 50 MCG/ACT nasal spray PLACE 2 SPRAYS INTO THE NOSE DAILY AS NEEDED.  48 g  0  . PRESCRIPTION MEDICATION Currently on antibiotic for nail on foot      . sertraline (ZOLOFT) 100 MG tablet TAKE 1 TABLET BY MOUTH ONCE DAILY  90 tablet  1   No current facility-administered medications for this visit.    Family History  Problem Relation Age of Onset  . Heart disease Brother 57    cardiomyopathy s/p ICD, on transplant list  . Cancer Father 27    prostrate  . Hypertension Father   . Arthritis Mother     ROS:  Pertinent items are noted in HPI.  Otherwise, a comprehensive ROS was negative.  Exam:   BP 138/78  Pulse 60  Resp 16  Ht 5' 2.75" (1.594 m)  Wt 153 lb 9.6 oz (69.673 kg)  BMI 27.42 kg/m2  LMP 05/22/2003  Weight change:+2lbs   Height: 5' 2.75" (159.4 cm)  Ht Readings from Last 3 Encounters:  02/02/13 5' 2.75" (1.594 m)  10/27/12 5\' 3"  (1.6 m)  09/29/12 5\' 2"  (1.575 m)    General appearance: alert, cooperative and appears stated age Head: Normocephalic, without obvious abnormality, atraumatic Neck: no adenopathy, supple, symmetrical, trachea midline  and thyroid normal to inspection and palpation Lungs: clear to auscultation bilaterally Breasts: normal appearance, no masses or tenderness Heart: regular rate and rhythm Abdomen: soft, non-tender; bowel sounds normal; no masses,  no organomegaly Extremities: extremities normal, atraumatic, no cyanosis or edema Skin: Skin color, texture, turgor normal. No rashes or lesions Lymph nodes: Cervical, supraclavicular, and axillary nodes normal. No abnormal inguinal nodes palpated Neurologic: Grossly normal   Pelvic: External genitalia:  no lesions              Urethra:  normal appearing urethra with no masses, tenderness or lesions              Bartholins and Skenes: normal                 Vagina: normal appearing vagina with normal color and discharge, no lesions              Cervix: absent               Pap taken: yes Bimanual Exam:  Uterus:  uterus absent              Adnexa: no mass, fullness, tenderness               Rectovaginal: Confirms               Anus:  normal sphincter tone, no lesions  A:  Well Woman with normal exam PMP, No HRT H/o endometrial cancer, s/p TLH/BSO 7/13 (on every six month f/u) Hypothyroidism  P:   Mammogram yearly pap smear obtained today Labs with Dr. Felicity Coyer No rx's needed Pt will do BMD with MMG next year.  Order entered. return annually or prn  An After Visit Summary was printed and given to the patient.

## 2013-02-02 NOTE — Patient Instructions (Signed)

## 2013-02-03 ENCOUNTER — Other Ambulatory Visit: Payer: Self-pay | Admitting: Internal Medicine

## 2013-02-04 ENCOUNTER — Ambulatory Visit: Payer: 59

## 2013-02-05 LAB — IPS PAP SMEAR ONLY

## 2013-02-10 ENCOUNTER — Ambulatory Visit (INDEPENDENT_AMBULATORY_CARE_PROVIDER_SITE_OTHER): Payer: 59

## 2013-02-10 DIAGNOSIS — Z23 Encounter for immunization: Secondary | ICD-10-CM

## 2013-03-30 ENCOUNTER — Encounter: Payer: Self-pay | Admitting: Internal Medicine

## 2013-03-30 ENCOUNTER — Other Ambulatory Visit (INDEPENDENT_AMBULATORY_CARE_PROVIDER_SITE_OTHER): Payer: 59

## 2013-03-30 ENCOUNTER — Ambulatory Visit (INDEPENDENT_AMBULATORY_CARE_PROVIDER_SITE_OTHER): Payer: 59 | Admitting: Internal Medicine

## 2013-03-30 VITALS — BP 118/76 | HR 70 | Temp 97.4°F | Wt 160.0 lb

## 2013-03-30 DIAGNOSIS — E039 Hypothyroidism, unspecified: Secondary | ICD-10-CM

## 2013-03-30 DIAGNOSIS — Z23 Encounter for immunization: Secondary | ICD-10-CM

## 2013-03-30 DIAGNOSIS — E119 Type 2 diabetes mellitus without complications: Secondary | ICD-10-CM

## 2013-03-30 LAB — HEMOGLOBIN A1C: Hgb A1c MFr Bld: 6.6 % — ABNORMAL HIGH (ref 4.6–6.5)

## 2013-03-30 LAB — MICROALBUMIN / CREATININE URINE RATIO
Creatinine,U: 91.2 mg/dL
Microalb Creat Ratio: 0.2 mg/g (ref 0.0–30.0)

## 2013-03-30 NOTE — Patient Instructions (Signed)
It was good to see you today.  We have reviewed your prior records including labs and tests today  Medications reviewed and updated, no changes recommended at this time.  Test(s) ordered today. Your results will be released to MyChart (or called to you) after review, usually within 72hours after test completion. If any changes need to be made, you will be notified at that same time.  Please schedule followup in 6 months for physical labs and diabetes mellitus/thyroid check, call sooner if problems.

## 2013-03-30 NOTE — Progress Notes (Signed)
  Subjective:    Patient ID: Cheryl Chan, female    DOB: 04-20-1956, 57 y.o.   MRN: [id number]  HPI  Here for follow up - reviewed chronic medical issues:  DM2, diet controlled since 2010 - hx gestational DM requiring insulin x 2 preg, stopped metformin 2010 due to a1c <6. Follows careful diet and exercise program with close attention to weight  Dyslipidemia - intolerant of statins - has tried several in past - also intol of WelChol - follows careful diet/exercise program to control weight to manage same  Hypothyroid - prefers Armour - the patient reports compliance with medication(s) as prescribed. Denies adverse side effects.  Past Medical History  Diagnosis Date  . Hypothyroidism   . Hyperlipidemia   . Arthritis   . 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 History of chicken pox   . 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 Endometrial cancer 10/2011 dx    s/p vag hyst 11/2011  . Diet-controlled type 2 diabetes mellitus   . Complex endometrial hyperplasia 2005     Review of Systems      Objective:   Physical Exam BP 118/76  Pulse 70  Temp(Src) 97.4 F (36.3 C) (Oral)  Wt 160 lb (72.576 kg)  SpO2 97%  LMP 05/22/2003 Wt Readings from Last 3 Encounters:  03/30/13 160 lb (72.576 kg)  02/02/13 153 lb 9.6 oz (69.673 kg)  10/27/12 152 lb 6.4 oz (69.128 kg)   Constitutional: She appears well-developed and well-nourished. No distress Neck: Normal range of motion. Neck supple. No JVD present. No thyromegaly present.  Cardiovascular: Normal rate, regular rhythm and normal heart sounds.  No murmur heard. No BLE edema. Pulmonary/Chest: Effort normal and breath sounds normal. No respiratory distress. She has no wheezes.  Psychiatric: She has a normal mood and affect. Her behavior is normal. Judgment and thought content normal.   Lab Results  Component Value Date   WBC 3.1* 09/23/2012   HGB 12.8 09/23/2012   HCT 37.2 09/23/2012   PLT 248.0 09/23/2012   GLUCOSE 123* 09/23/2012    CHOL 214* 09/23/2012   TRIG 56.0 09/23/2012   HDL 55.90 09/23/2012   LDLDIRECT 148.7 09/23/2012   ALT 15 09/23/2012   AST 18 09/23/2012   NA 138 09/23/2012   K 4.1 09/23/2012   CL 104 09/23/2012   CREATININE 0.6 09/23/2012   BUN 14 09/23/2012   CO2 29 09/23/2012   TSH 2.87 09/23/2012   HGBA1C 6.5 09/23/2012   MICROALBUR 0.2 09/10/2011        Assessment & Plan:   see problem list. Medications and labs reviewed today.

## 2013-03-30 NOTE — Progress Notes (Signed)
Pre-visit discussion using our clinic review tool. No additional management support is needed unless otherwise documented below in the visit note.  

## 2013-03-30 NOTE — Assessment & Plan Note (Signed)
Hx gestational DM Previously on metformin - none since 2010 Check a1c q 41mo to monitor Encouraged continued weight control, diet and exercsie Lab Results  Component Value Date   HGBA1C 6.5 09/23/2012

## 2013-03-30 NOTE — Assessment & Plan Note (Signed)
On Armour Check TSH q 56mo and adjust dose if needed The current medical regimen is effective;  continue present plan and medications. Lab Results  Component Value Date   TSH 2.87 09/23/2012

## 2013-04-03 ENCOUNTER — Other Ambulatory Visit: Payer: Self-pay | Admitting: Internal Medicine

## 2013-05-18 ENCOUNTER — Other Ambulatory Visit: Payer: Self-pay | Admitting: Internal Medicine

## 2013-06-16 ENCOUNTER — Telehealth (HOSPITAL_COMMUNITY): Payer: Self-pay | Admitting: Family Medicine

## 2013-06-16 MED ORDER — OSELTAMIVIR PHOSPHATE 75 MG PO CAPS: 75.0000 mg | ORAL_CAPSULE | Freq: Every day | ORAL | Status: DC

## 2013-06-16 NOTE — ED Notes (Signed)
Husband recently dx with flu.  Calling in ppx tamiflu No [place], MD 06/16/13 1420

## 2013-06-24 ENCOUNTER — Other Ambulatory Visit: Payer: Self-pay

## 2013-06-24 DIAGNOSIS — Z1231 Encounter for screening mammogram for malignant neoplasm of breast: Secondary | ICD-10-CM

## 2013-07-02 ENCOUNTER — Other Ambulatory Visit: Payer: Self-pay | Admitting: Internal Medicine

## 2013-07-23 ENCOUNTER — Ambulatory Visit: Payer: 59 | Attending: Gynecologic Oncology | Admitting: Gynecologic Oncology

## 2013-07-23 ENCOUNTER — Encounter: Payer: Self-pay | Admitting: Gynecologic Oncology

## 2013-07-23 VITALS — BP 145/68 | HR 74 | Temp 97.9°F | Ht 62.0 in | Wt 160.8 lb

## 2013-07-23 DIAGNOSIS — Z79899 Other long term (current) drug therapy: Secondary | ICD-10-CM | POA: Insufficient documentation

## 2013-07-23 DIAGNOSIS — W19XXXA Unspecified fall, initial encounter: Secondary | ICD-10-CM | POA: Insufficient documentation

## 2013-07-23 DIAGNOSIS — E119 Type 2 diabetes mellitus without complications: Secondary | ICD-10-CM | POA: Insufficient documentation

## 2013-07-23 DIAGNOSIS — Z90710 Acquired absence of both cervix and uterus: Secondary | ICD-10-CM | POA: Insufficient documentation

## 2013-07-23 DIAGNOSIS — E039 Hypothyroidism, unspecified: Secondary | ICD-10-CM | POA: Insufficient documentation

## 2013-07-23 DIAGNOSIS — F329 Major depressive disorder, single episode, unspecified: Secondary | ICD-10-CM | POA: Insufficient documentation

## 2013-07-23 DIAGNOSIS — F3289 Other specified depressive episodes: Secondary | ICD-10-CM | POA: Insufficient documentation

## 2013-07-23 DIAGNOSIS — E785 Hyperlipidemia, unspecified: Secondary | ICD-10-CM | POA: Insufficient documentation

## 2013-07-23 DIAGNOSIS — C549 Malignant neoplasm of corpus uteri, unspecified: Secondary | ICD-10-CM | POA: Insufficient documentation

## 2013-07-23 DIAGNOSIS — C541 Malignant neoplasm of endometrium: Secondary | ICD-10-CM

## 2013-07-23 NOTE — Patient Instructions (Signed)
Followup with Dr. Sabra Heck in 6 months and return to see GYN oncology in one year

## 2013-07-23 NOTE — Progress Notes (Signed)
Consult Note: Gyn-Onc  Cheryl Chan 58 y.o. female  CC:  Chief Complaint  Patient presents with  . Endometrial cancer    Follow up    HPI: Patient is a 58 year old who was initially seen by Dr. Colonel Bald at [facility]. She was referred by Dr. Edwinna Areola for endometrial carcinoma.  She had experienced a gush of fluid was seen by her gynecologist. Endometrial biopsy revealed a grade 1 endometrial cancer. Her prior history was menopause approximately 5 years prior. After discussion she opted for surgical management. On July 16 of year 2013 she underwent a total robotic hysterectomy bilateral salpingo-oophorectomy and right pelvic lymph node dissection. The operative findings included normal uterus cervix and adnexa. There is adhesive disease of the bladder to the anterior uterus. Frozen section revealed a grade 1 endometrial adenocarcinoma with minimal to no myometrial invasion.   Final pathology revealed a grade 1 endometrioid adenocarcinoma that extended 0.4 cm with the myometrium was 1.2 cm in thickness. There was no lymphovascular space involvement. The maximum tumor size was 1.5 cm it was grade 1 lesion. The adnexa were negative and 0/5 right pelvic lymph nodes were involved. I last saw her in March of 2014 and she was last seen by Dr. Sabra Heck in September 2013. At that time and exam and Pap smear were both done. She comes in today for followup.   Interval History:  She is overall doing quite well. She and her husband had marital issues during the last year but these have improved. She is up-to-date on her mammograms and has for next week along with a bone density. She's due for colonoscopy in 5 years. There are no new medical problems and her family. [place] was expecting his first child a little boy Retail banker) in May. She is very excited and looking forward to traveling to [place] to see them. She's been staying be but has not been exercising on a regular basis.  Review of  Systems: She denies any chest pain, shortness of breath, nausea, vomiting, chest pain, shortness of breath. Her weight continues  To fluctuate about 10 pounds needed direction. Currently down approximately 5 pounds from our was a few weeks ago. She has not noticed any bleeding or change in her bowel or bladder habits. 10 point review of systems is otherwise negative.  Current Meds:  Outpatient Encounter Prescriptions as of 07/23/2013  Medication Sig  . ARMOUR THYROID 90 MG tablet TAKE 1 TABLET BY MOUTH DAILY.  Marland Kitchen Cyanocobalamin (B-12) 2500 MCG TABS Take by mouth daily.  . fluticasone (FLONASE) 50 MCG/ACT nasal spray INHALE 2 SPRAYS INTO THE NOSE DAILY AS NEEDED.  . naproxen (NAPROSYN) 250 MG tablet Take 250 mg by mouth 2 (two) times daily with a meal.  . oseltamivir (TAMIFLU) 75 MG capsule Take 1 capsule (75 mg total) by mouth daily.  Marland Kitchen PRESCRIPTION MEDICATION Currently on antibiotic for nail on foot  . sertraline (ZOLOFT) 100 MG tablet TAKE 1 TABLET BY MOUTH ONCE DAILY    Allergy:  Allergies  Allergen Reactions  . Crestor [Rosuvastatin Calcium]     Muscle ache  . Demerol [Meperidine]   . Statins     Social Hx:   History   Social History  . Marital Status: Married    Spouse Name: N/A    Number of Children: N/A  . Years of Education: N/A   Occupational History  . Not on file.   Social History Main Topics  . Smoking status:  Never Smoker   . Smokeless tobacco: Never Used  . Alcohol Use: .5 - 1 oz/week    1-2 drink(s) per week  . Drug Use: No  . Sexual Activity: No     Comment: hysterectomy   Other Topics Concern  . 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 Cesarean section      x's 4  . Fracture surgery      nose as a child  . Dilation and curettage of uterus      hysteroscopy  . Robotic  assisted lap vaginal hysterectomy  12/04/2011    Procedure: ROBOTIC ASSISTED LAPAROSCOPIC VAGINAL HYSTERECTOMY;  Surgeon: Imagene Gurney A. Alycia Rossetti, MD;  Location: [facility];  Service: Gynecology;  Laterality: N/A;  . Open reduction internal fixation (orif) distal phalanx Left 08/05/2012    Procedure: OPEN REDUCTION INTERNAL FIXATION (ORIF) DISTAL PHALANX VERSUS EXTERNAL FIXATION LEFT RING FINGER;  Surgeon: Tennis Must, MD;  Location: [place];  Service: Orthopedics;  Laterality: Left;    Past Medical Hx:  Past Medical History  Diagnosis Date  . Hypothyroidism   . Hyperlipidemia   . Arthritis   . 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 History of chicken pox   . 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 Endometrial cancer 10/2011 dx    s/p vag hyst 11/2011  . Diet-controlled type 2 diabetes mellitus   . Complex endometrial hyperplasia 2005    Family Hx:  Family History  Problem Relation Age of Onset  . Heart disease Brother 13    cardiomyopathy s/p ICD, on transplant list  . Cancer Father 80    prostrate  . Hypertension Father   . Arthritis Mother     Vitals:  Blood pressure 145/68, pulse 74, temperature 97.9 F (36.6 C), temperature source Oral, height 5\' 2"  (1.575 m), weight 160 lb 12.8 oz (72.938 kg), last menstrual period 05/22/2003.  Physical Exam: Neck is supple, there is no lymphadenopathy no thyromegaly  Lungs: Clear to auscultation bilaterally.  Cardiovascular: Regular rate rhythm.  Abdomen: Well-healed surgical incisions. There is no evidence of any incisional hernias. She has an ecchymosis on her left lower quadrant. There is no masses or hepatosplenomegaly. Abdomen is nontender.  Groins: No lymphadenopathy.  Extremities: No edema.  Pelvic: Normal external female genitalia. Vagina slightly atrophic. There is no vaginal cuff lesions. Bimanual examination reveals no masses or nodularity. Rectal confirms.  Assessment/Plan: 58 year old diagnosed  with a stage IA grade 1 endometrial adenocarcinoma status post definitive surgery in July 2013. She has no evidence of recurrent disease. She will followup with Dr. Sabra Heck in 6 months for her annual examination of the Pap smear once a year from the time of the anniversary of her diagnosis. She'll followup with [place] in 1 year. She knows to contact us if she has been issues with bleeding pelvic or abdominal pain.  Ayla Dunigan A., MD 07/23/2013, 1:26 PM

## 2013-07-23 NOTE — Progress Notes (Signed)
Reviewed personally.  M. Suzanne Jazzman Loughmiller, MD.  

## 2013-07-28 ENCOUNTER — Other Ambulatory Visit: Payer: Self-pay | Admitting: Internal Medicine

## 2013-07-28 ENCOUNTER — Other Ambulatory Visit: Payer: Self-pay

## 2013-07-28 ENCOUNTER — Ambulatory Visit
Admission: RE | Admit: 2013-07-28 | Discharge: 2013-07-28 | Disposition: A | Discharge status: Home / Self Care | Payer: 59 | Source: Ambulatory Visit

## 2013-07-28 ENCOUNTER — Ambulatory Visit
Admission: RE | Admit: 2013-07-28 | Discharge: 2013-07-28 | Disposition: A | Discharge status: Home / Self Care | Payer: 59 | Source: Ambulatory Visit | Attending: Obstetrics & Gynecology | Admitting: Obstetrics & Gynecology

## 2013-07-28 ENCOUNTER — Ambulatory Visit
Admission: RE | Admit: 2013-07-28 | Discharge: 2013-07-28 | Disposition: A | Discharge status: Home / Self Care | Payer: 59 | Source: Ambulatory Visit | Attending: Internal Medicine | Admitting: Internal Medicine

## 2013-07-28 DIAGNOSIS — Z1231 Encounter for screening mammogram for malignant neoplasm of breast: Secondary | ICD-10-CM

## 2013-07-28 DIAGNOSIS — E2839 Other primary ovarian failure: Secondary | ICD-10-CM

## 2013-07-29 ENCOUNTER — Encounter: Payer: Self-pay | Admitting: Internal Medicine

## 2013-08-07 ENCOUNTER — Telehealth: Payer: Self-pay

## 2013-08-07 NOTE — Telephone Encounter (Signed)
[email or web address]

## 2013-08-07 NOTE — Telephone Encounter (Signed)
Message copied by Robley Fries on Fri Aug 07, 2013  3:58 PM ------      Message from: Megan Salon      Created: Thu Aug 06, 2013  6:01 PM       Informed via Mychart of normal results.            Cheryl Chan,      Your bone density looks really good.  It is normal.  The recommendation is to try and get 1200mg  calcium in diet and supplement (combined) and 800 IU Vit D daily.  I will repeat your bone density test in 5 years.  Hope you are well.            Dr. Sabra Heck ------

## 2013-09-24 ENCOUNTER — Other Ambulatory Visit (INDEPENDENT_AMBULATORY_CARE_PROVIDER_SITE_OTHER): Payer: 59

## 2013-09-24 ENCOUNTER — Encounter: Payer: Self-pay | Admitting: Internal Medicine

## 2013-09-24 ENCOUNTER — Ambulatory Visit (INDEPENDENT_AMBULATORY_CARE_PROVIDER_SITE_OTHER): Payer: 59 | Admitting: Internal Medicine

## 2013-09-24 VITALS — BP 130/78 | HR 71 | Temp 98.4°F | Ht 62.0 in | Wt 148.2 lb

## 2013-09-24 DIAGNOSIS — Z0000 Encounter for general adult medical examination without abnormal findings: Secondary | ICD-10-CM

## 2013-09-24 DIAGNOSIS — E039 Hypothyroidism, unspecified: Secondary | ICD-10-CM

## 2013-09-24 DIAGNOSIS — E119 Type 2 diabetes mellitus without complications: Secondary | ICD-10-CM

## 2013-09-24 DIAGNOSIS — H6123 Impacted cerumen, bilateral: Secondary | ICD-10-CM

## 2013-09-24 DIAGNOSIS — H6120 Impacted cerumen, unspecified ear: Secondary | ICD-10-CM

## 2013-09-24 LAB — MICROALBUMIN / CREATININE URINE RATIO
Creatinine,U: 26.5 mg/dL
MICROALB UR: 0.1 mg/dL (ref 0.0–1.9)
Microalb Creat Ratio: 0.4 mg/g (ref 0.0–30.0)

## 2013-09-24 LAB — LIPID PANEL
CHOL/HDL RATIO: 4
Cholesterol: 220 mg/dL — ABNORMAL HIGH (ref 0–200)
HDL: 56.7 mg/dL (ref >39.00)
LDL Cholesterol: 151 mg/dL — ABNORMAL HIGH (ref 0–99)
TRIGLYCERIDES: 60 mg/dL (ref 0.0–149.0)
VLDL: 12 mg/dL (ref 0.0–40.0)

## 2013-09-24 LAB — BASIC METABOLIC PANEL
BUN: 16 mg/dL (ref 6–23)
CO2: 29 mEq/L (ref 19–32)
CREATININE: 0.6 mg/dL (ref 0.4–1.2)
Calcium: 9 mg/dL (ref 8.4–10.5)
Chloride: 104 mEq/L (ref 96–112)
GFR: 109.38 mL/min (ref >60.00)
Glucose, Bld: 122 mg/dL — ABNORMAL HIGH (ref 70–99)
Potassium: 4 mEq/L (ref 3.5–5.1)
Sodium: 139 mEq/L (ref 135–145)

## 2013-09-24 LAB — CBC WITH DIFFERENTIAL/PLATELET
BASOS PCT: 0.4 % (ref 0.0–3.0)
Basophils Absolute: 0 10*3/uL (ref 0.0–0.1)
EOS PCT: 1.7 % (ref 0.0–5.0)
Eosinophils Absolute: 0.1 10*3/uL (ref 0.0–0.7)
HCT: 38.6 % (ref 36.0–46.0)
Hemoglobin: 13.1 g/dL (ref 12.0–15.0)
Lymphocytes Relative: 30.1 % (ref 12.0–46.0)
Lymphs Abs: 1.2 10*3/uL (ref 0.7–4.0)
MCHC: 33.8 g/dL (ref 30.0–36.0)
MCV: 89 fl (ref 78.0–100.0)
MONO ABS: 0.4 10*3/uL (ref 0.1–1.0)
Monocytes Relative: 9.8 % (ref 3.0–12.0)
NEUTROS PCT: 58 % (ref 43.0–77.0)
Neutro Abs: 2.3 10*3/uL (ref 1.4–7.7)
PLATELETS: 236 10*3/uL (ref 150.0–400.0)
RBC: 4.34 Mil/uL (ref 3.87–5.11)
RDW: 14.9 % (ref 11.5–15.5)
WBC: 4 10*3/uL (ref 4.0–10.5)

## 2013-09-24 LAB — HEPATIC FUNCTION PANEL
ALT: 18 U/L (ref 0–35)
AST: 18 U/L (ref 0–37)
Albumin: 4.1 g/dL (ref 3.5–5.2)
Alkaline Phosphatase: 55 U/L (ref 39–117)
BILIRUBIN DIRECT: 0.1 mg/dL (ref 0.0–0.3)
BILIRUBIN TOTAL: 0.8 mg/dL (ref 0.2–1.2)
TOTAL PROTEIN: 7.1 g/dL (ref 6.0–8.3)

## 2013-09-24 LAB — HEMOGLOBIN A1C: Hgb A1c MFr Bld: 6.4 % (ref 4.6–6.5)

## 2013-09-24 LAB — TSH: TSH: 2.93 u[IU]/mL (ref 0.35–4.50)

## 2013-09-24 MED ORDER — ATOVAQUONE-PROGUANIL HCL 250-100 MG PO TABS: 1.0000 | ORAL_TABLET | Freq: Every day | ORAL | Status: DC

## 2013-09-24 MED ORDER — CIPROFLOXACIN HCL 500 MG PO TABS: 500.0000 mg | ORAL_TABLET | Freq: Two times a day (BID) | ORAL | Status: DC

## 2013-09-24 NOTE — Assessment & Plan Note (Signed)
Hx gestational DM Previously on metformin - none since 2010 Check a1c q 72mo to monitor Encouraged continued weight control, diet and exercsie Lab Results  Component Value Date   HGBA1C 6.6* 03/30/2013

## 2013-09-24 NOTE — Assessment & Plan Note (Signed)
On Armour Check TSH q 71mo and adjust dose if needed The current medical regimen is effective;  continue present plan and medications. Lab Results  Component Value Date   TSH 2.24 03/30/2013

## 2013-09-24 NOTE — Progress Notes (Signed)
Pre visit review using our clinic review tool, if applicable. No additional management support is needed unless otherwise documented below in the visit note. 

## 2013-09-24 NOTE — Progress Notes (Signed)
Cheryl Chan [id number] 09/24/2013  Chief Complaint  Patient presents with  . Follow-up    6 month  . Annual Exam    Subjective  HPI  Patient here for 6 mo f/u to review chronic medical issues and to get prescriptions.  Needs prescriptions for ciprofloxacin and malirone for trip  to [place] 5/26.  Ears are impacted and would like to have ear lavage done.   Interested in receiving shingles shot.   Hx/ of uterine cancer: Seeing gynecologist and gynecological oncologist twice a year (once a year at each). Last pap smear (01/2013)- negative.  DM II: 03/30/14- A1C 6.6 (goal <7) Controlled on diet (medifast diet) and exercise regimen (walks 3-4 times/week).  Has lost 12 lbs in 2 months intentionally.   Dyslipidemia: 09/23/12-LDL 148.6 (Goal <160, ideally <100). Controlled with diet and exercise (see DM II above). Has not tolerated statins due to SE in the past.   Hypothyroid: 03/30/14-TSH normal. Stable on Armour medication.   Osteoarthritis: Joints in hands BL have been aching. Takes ibuprofen PRN for pain.   Mammogram (07/2013)-negative; Next colonoscopy-(2017); Bone Density Scan (01/2013)- normal, repeat in 5 years.    Past Medical History  Diagnosis Date  . Hypothyroidism   . Hyperlipidemia   . Arthritis   . 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 History of chicken pox   . 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 Endometrial cancer 10/2011 dx    s/p vag hyst 11/2011  . Diet-controlled type 2 diabetes mellitus   . Complex endometrial hyperplasia 2005    Past Surgical History  Procedure Laterality Date  . Cesarean section      x's 4  . Fracture surgery      nose as a child  . Dilation and curettage of uterus      hysteroscopy  . Robotic assisted lap vaginal hysterectomy  12/04/2011    Procedure: ROBOTIC ASSISTED LAPAROSCOPIC VAGINAL HYSTERECTOMY;  Surgeon: Imagene Gurney A. Alycia Rossetti, MD;  Location: [facility];  Service: Gynecology;  Laterality: N/A;  . Open reduction  internal fixation (orif) distal phalanx Left 08/05/2012    Procedure: OPEN REDUCTION INTERNAL FIXATION (ORIF) DISTAL PHALANX VERSUS EXTERNAL FIXATION LEFT RING FINGER;  Surgeon: Tennis Must, MD;  Location: [facility];  Service: Orthopedics;  Laterality: Left;    Family History  Problem Relation Age of Onset  . Heart disease Brother 95    cardiomyopathy s/p ICD, on transplant list  . Cancer Father 4    prostrate  . Hypertension Father   . Arthritis Mother     History  Substance Use Topics  . Smoking status: Never Smoker   . Smokeless tobacco: Never Used  . Alcohol Use: 0.5 - 1.0 oz/week    1-2 drink(s) per week    Current Outpatient Prescriptions on File Prior to Visit  Medication Sig Dispense Refill  . ARMOUR THYROID 90 MG tablet TAKE 1 TABLET BY MOUTH DAILY.  90 tablet  2  . fluticasone (FLONASE) 50 MCG/ACT nasal spray INHALE 2 SPRAYS INTO THE NOSE DAILY AS NEEDED.  48 g  0  . naproxen (NAPROSYN) 250 MG tablet Take 250 mg by mouth 2 (two) times daily with a meal.      . sertraline (ZOLOFT) 100 MG tablet TAKE 1 TABLET BY MOUTH ONCE DAILY  90 tablet  1  . PRESCRIPTION MEDICATION Currently on antibiotic for nail on foot  No current facility-administered medications on file prior to visit.     Allergies: Allergies  Allergen Reactions  . Crestor [Rosuvastatin Calcium]     Muscle ache  . Demerol [Meperidine]   . Statins     Review of Systems  Constitutional: Positive for weight loss. Negative for malaise/fatigue.  HENT: Negative for ear pain.   Eyes: Negative for photophobia and redness.  Respiratory: Negative for cough, shortness of breath and wheezing.   Cardiovascular: Negative for chest pain, palpitations, orthopnea and leg swelling.  Gastrointestinal: Negative for nausea, vomiting, abdominal pain, diarrhea and constipation.       Sticky stools- says it is from [employer].  Genitourinary: Negative for dysuria, urgency and frequency.    Musculoskeletal: Positive for joint pain. Negative for back pain and myalgias.       Joints in hands aching.   Skin: Negative for itching and rash.  Neurological: Negative for dizziness, tingling and headaches.  Psychiatric/Behavioral: Negative for depression and suicidal ideas. The patient is not nervous/anxious and does not have insomnia.        Objective  Filed Vitals:   09/24/13 0918  BP: 130/78  Pulse: 71  Temp: 98.4 F (36.9 C)  TempSrc: Oral  Height: 5\' 2"  (1.575 m)  Weight: 148 lb 4 oz (67.246 kg)  SpO2: 97%    Physical Exam  Constitutional: She appears well-developed and well-nourished. No distress.  HENT:  Nose: Nose normal.  Mouth/Throat: Oropharynx is clear and moist. No oropharyngeal exudate.  Right and left external ear canals occluded with cerumen. No visible TM's.   Eyes: Pupils are equal, round, and reactive to light. Right eye exhibits no discharge. No scleral icterus.  Neck: Neck supple. No thyromegaly present.  Cardiovascular: Normal rate, regular rhythm and normal heart sounds.  Exam reveals no gallop and no friction rub.   No murmur heard. Respiratory: Effort normal and breath sounds normal. No respiratory distress. She has no wheezes. She has no rales.  Musculoskeletal: She exhibits no edema and no tenderness.  Lymphadenopathy:    She has no cervical adenopathy.  Skin: No rash noted. No erythema.  Psychiatric: She has a normal mood and affect. Her behavior is normal. Judgment and thought content normal.    BP Readings from Last 3 Encounters:  09/24/13 130/78  07/23/13 145/68  03/30/13 118/76    Wt Readings from Last 3 Encounters:  09/24/13 148 lb 4 oz (67.246 kg)  07/23/13 160 lb 12.8 oz (72.938 kg)  03/30/13 160 lb (72.576 kg)    Lab Results  Component Value Date   WBC 3.1* 09/23/2012   HGB 12.8 09/23/2012   HCT 37.2 09/23/2012   PLT 248.0 09/23/2012   GLUCOSE 123* 09/23/2012   CHOL 214* 09/23/2012   TRIG 56.0 09/23/2012   HDL 55.90 09/23/2012    LDLDIRECT 148.7 09/23/2012   ALT 15 09/23/2012   AST 18 09/23/2012   NA 138 09/23/2012   K 4.1 09/23/2012   CL 104 09/23/2012   CREATININE 0.6 09/23/2012   BUN 14 09/23/2012   CO2 29 09/23/2012   TSH 2.24 03/30/2013   HGBA1C 6.6* 03/30/2013   MICROALBUR 0.2 03/30/2013    Dg Bone Density  07/28/2013   CLINICAL DATA 58 year old postmenopausal female with history of uterine cancer.  EXAM DUAL X-RAY ABSORPTIOMETRY (DXA) FOR BONE MINERAL DENSITY  FINDINGS AP LUMBAR SPINE (L1-L4)  Bone Mineral Density (BMD):  0.978 g/cm2  Young Adult T-Score:  -0.6  Z-Score:  0.6  LEFT FEMUR (NECK)  Bone Mineral Density (BMD):  0.743 g/cm2  Young Adult T-Score: -1.0  Z-Score:  0.2  ASSESSMENT: Patient's diagnostic category is NORMAL by WHO Criteria.  FRACTURE RISK: NOT INCREASED  FRAX:  NOT APPLICABLE  COMPARISON: There has been a statistically significant 2.4% decrease in BMD in the spine and no significant change in BMD in the total left hip as compared to 05/10/2008.  Effective therapies are available in the form of bisphosphonates, selective estrogen receptor modulators, biologic agents, and hormone replacement therapy (for women). All patients should ensure an adequate intake of dietary calcium (1200 mg daily) and vitamin D (800 IU daily) unless contraindicated.  All treatment decisions require clinical judgment and consideration of individual patient factors, including patient preferences, co-morbidities, previous drug use, risk factors not captured in the FRAX model (e.g., frailty, falls, vitamin D deficiency, increased bone turnover, interval significant decline in bone density) and possible under- or over-estimation of fracture risk by FRAX.  The National Osteoporosis Foundation recommends that FDA-approved medical therapies be considered in postmenopausal women and men age 7 or older with a:  1. Hip or vertebral (clinical or morphometric) fracture.  2. T-score of -2.5 or lower at the spine or hip.  3. Ten-year fracture  probability by FRAX of 3% or greater for hip fracture or 20% or greater for major osteoporotic fracture.  People with diagnosed cases of osteoporosis or at high risk for fracture should have regular bone mineral density tests. For patients eligible for Medicare, routine testing is allowed once every 2 years. The testing frequency can be increased to one year for patients who have rapidly progressing disease, those who are receiving or discontinuing medical therapy to restore bone mass, or have additional risk factors.  World Pharmacologist [facility]) Criteria:  Normal: T-scores from +1.0 to -1.0  Low Bone Mass (Osteopenia): T-scores between -1.0 and -2.5  Osteoporosis: T-scores -2.5 and below  Comparison to Reference Population:  T-score is the key measure used in the diagnosis of osteoporosis and relative risk determination for fracture. It provides a value for bone mass relative to the mean bone mass of a young adult reference population expressed in terms of standard deviation (SD).  Z-score is the age-matched score showing the patient's values compared to a population matched for age, sex, and race. This is also expressed in terms of standard deviation. The patient may have values that compare favorably to the age-matched values and still be at increased risk for fracture.  SIGNATURE  Electronically Signed   By: Ulyess Blossom M.D.   On: 07/28/2013 17:41   Mm Digital Screening Bilateral  07/29/2013   CLINICAL DATA Screening.  EXAM DIGITAL SCREENING BILATERAL MAMMOGRAM WITH CAD  COMPARISON Previous exam(s).  ACR BREAST DENSITY ACR Breast Density Category b: There are scattered areas of fibroglandular density.  FINDINGS There are no findings suspicious for malignancy. Images were processed with CAD.  IMPRESSION No mammographic evidence of malignancy. A result letter of this screening mammogram will be mailed directly to the patient.  RECOMMENDATION Screening mammogram in one year. (Code:SM-B-01Y)  BI-RADS  CATEGORY 1: Negative.  SIGNATURE  Electronically Signed   By: Marin Olp M.D.   On: 07/29/2013 08:41       Assessment and Plan  Prescribed malirone and ciprofloxacin for trip to [place] (5/26) for malaria prophylaxis (malirone) and in case of traveler's diarrhea (ciprofloxacin).   Osteoarthritis in hands: Joints aching in hands BL. Told patient to continue ibuprofen once a day with food for joint pain.  Continue medical regimens/diet/exercise for chronic medical issues reviewed above (see HPI).   Ordered labs: Lipid Panel, BMP, CBC w/diff, TSH, A1C, Hepatic Function Panel, Urinalysis  Will not administer shingles shot today. Will be done at next f/u appointment. Discussed risks/benefits of getting shingles shot prior to age 73-patient still wants shot at next appointment.   No Follow-up on file. Berenice Bouton, Student-PA   I have personally reviewed this case with PA student. I also personally examined this patient. I agree with history and findings as documented above. I reviewed, discussed and approve of the assessment and plan as listed above. Rowe Clack, MD

## 2013-09-24 NOTE — Patient Instructions (Signed)
It was good to see you today.  We have reviewed your prior records including labs and tests today  Health Maintenance reviewed - all recommended immunizations and age-appropriate screenings are up-to-date. Will consider Shingles vaccination next visit  Test(s) ordered today. Your results will be released to [place] (or called to you) after review, usually within 72hours after test completion. If any changes need to be made, you will be notified at that same time.  Medications reviewed and updated, no changes recommended at this time. Okay for travel supply of Cipro and malnarone as requested -both sent your pharmacy  Your ears have been irrigated of wax today -let us know if continued hearing problems persist for referral to audiologist and hearing testing  Please schedule followup in 6 months for semiannual exam and labs, call sooner if problems.

## 2013-09-24 NOTE — Progress Notes (Signed)
Subjective:    Patient ID: Cheryl Chan, female    DOB: [birth year], 58 y.o.   MRN: [id number]  HPI  patient is here today for annual physical. Patient feels well overall. Also reviewed chronic medical issues and interval medical events  Past Medical History  Diagnosis Date  . Hypothyroidism   . Hyperlipidemia   . Arthritis   . 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 History of chicken pox   . 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 Endometrial cancer 10/2011 dx    s/p vag hyst 11/2011  . Diet-controlled type 2 diabetes mellitus   . Complex endometrial hyperplasia 2005   Family History  Problem Relation Age of Onset  . Heart disease Brother 70    cardiomyopathy s/p ICD, on transplant list  . Cancer Father 31    prostrate  . Hypertension Father   . Arthritis Mother    History  Substance Use Topics  . Smoking status: Never Smoker   . Smokeless tobacco: Never Used  . Alcohol Use: 0.5 - 1.0 oz/week    1-2 drink(s) per week    Review of Systems  Constitutional: Negative for fatigue and unexpected weight change.  HENT: Negative for hearing loss.   Respiratory: Negative for cough, shortness of breath and wheezing.   Cardiovascular: Negative for chest pain, palpitations and leg swelling.  Gastrointestinal: Negative for nausea, abdominal pain and diarrhea.  Neurological: Negative for dizziness, weakness, light-headedness and headaches.  Psychiatric/Behavioral: Negative for dysphoric mood. The patient is not nervous/anxious.   All other systems reviewed and are negative.      Objective:   Physical Exam  BP 130/78  Pulse 71  Temp(Src) 98.4 F (36.9 C) (Oral)  Ht 5\' 2"  (1.575 m)  Wt 148 lb 4 oz (67.246 kg)  BMI 27.11 kg/m2  SpO2 97%  LMP 05/22/2003 Wt Readings from Last 3 Encounters:  09/24/13 148 lb 4 oz (67.246 kg)  07/23/13 160 lb 12.8 oz (72.938 kg)  03/30/13 160 lb (72.576 kg)   Constitutional: She appears well-developed and well-nourished. No  distress.  HENT: Head: Normocephalic and atraumatic. Ears: B cerumen impaction, after irrigation, B TMs ok, no erythema or effusion; Nose: Nose normal. Mouth/Throat: Oropharynx is clear and moist. No oropharyngeal exudate.  Eyes: Conjunctivae and EOM are normal. Pupils are equal, round, and reactive to light. No scleral icterus.  Neck: Normal range of motion. Neck supple. No JVD present. No thyromegaly present.  Cardiovascular: Normal rate, regular rhythm and normal heart sounds.  No murmur heard. No BLE edema. Pulmonary/Chest: Effort normal and breath sounds normal. No respiratory distress. She has no wheezes.  Abdominal: Soft. Bowel sounds are normal. She exhibits no distension. There is no tenderness. no masses Musculoskeletal: Normal range of motion, no joint effusions. No gross deformities Neurological: She is alert and oriented to person, place, and time. No cranial nerve deficit. Coordination, balance, strength, speech and gait are normal.  Skin: Skin is warm and dry. No rash noted. No erythema.  Psychiatric: She has a normal mood and affect. Her behavior is normal. Judgment and thought content normal.    Lab Results  Component Value Date   WBC 3.1* 09/23/2012   HGB 12.8 09/23/2012   HCT 37.2 09/23/2012   PLT 248.0 09/23/2012   GLUCOSE 123* 09/23/2012   CHOL 214* 09/23/2012   TRIG 56.0 09/23/2012   HDL 55.90 09/23/2012   LDLDIRECT 148.7 09/23/2012   ALT 15 09/23/2012  AST 18 09/23/2012   NA 138 09/23/2012   K 4.1 09/23/2012   CL 104 09/23/2012   CREATININE 0.6 09/23/2012   BUN 14 09/23/2012   CO2 29 09/23/2012   TSH 2.24 03/30/2013   HGBA1C 6.6* 03/30/2013   MICROALBUR 0.2 03/30/2013    Dg Bone Density  07/28/2013   CLINICAL DATA 57 year old postmenopausal female with history of uterine cancer.  EXAM DUAL X-RAY ABSORPTIOMETRY (DXA) FOR BONE MINERAL DENSITY  FINDINGS AP LUMBAR SPINE (L1-L4)  Bone Mineral Density (BMD):  0.978 g/cm2  Young Adult T-Score:  -0.6  Z-Score:  0.6  LEFT FEMUR (NECK)  Bone  Mineral Density (BMD):  0.743 g/cm2  Young Adult T-Score: -1.0  Z-Score:  0.2  ASSESSMENT: Patient's diagnostic category is NORMAL by WHO Criteria.  FRACTURE RISK: NOT INCREASED  FRAX:  NOT APPLICABLE  COMPARISON: There has been a statistically significant 2.4% decrease in BMD in the spine and no significant change in BMD in the total left hip as compared to 05/10/2008.  Effective therapies are available in the form of bisphosphonates, selective estrogen receptor modulators, biologic agents, and hormone replacement therapy (for women). All patients should ensure an adequate intake of dietary calcium (1200 mg daily) and vitamin D (800 IU daily) unless contraindicated.  All treatment decisions require clinical judgment and consideration of individual patient factors, including patient preferences, co-morbidities, previous drug use, risk factors not captured in the FRAX model (e.g., frailty, falls, vitamin D deficiency, increased bone turnover, interval significant decline in bone density) and possible under- or over-estimation of fracture risk by FRAX.  The National Osteoporosis Foundation recommends that FDA-approved medical therapies be considered in postmenopausal women and men age 5 or older with a:  1. Hip or vertebral (clinical or morphometric) fracture.  2. T-score of -2.5 or lower at the spine or hip.  3. Ten-year fracture probability by FRAX of 3% or greater for hip fracture or 20% or greater for major osteoporotic fracture.  People with diagnosed cases of osteoporosis or at high risk for fracture should have regular bone mineral density tests. For patients eligible for Medicare, routine testing is allowed once every 2 years. The testing frequency can be increased to one year for patients who have rapidly progressing disease, those who are receiving or discontinuing medical therapy to restore bone mass, or have additional risk factors.  World Pharmacologist [facility]) Criteria:  Normal: T-scores from +1.0 to  -1.0  Low Bone Mass (Osteopenia): T-scores between -1.0 and -2.5  Osteoporosis: T-scores -2.5 and below  Comparison to Reference Population:  T-score is the key measure used in the diagnosis of osteoporosis and relative risk determination for fracture. It provides a value for bone mass relative to the mean bone mass of a young adult reference population expressed in terms of standard deviation (SD).  Z-score is the age-matched score showing the patient's values compared to a population matched for age, sex, and race. This is also expressed in terms of standard deviation. The patient may have values that compare favorably to the age-matched values and still be at increased risk for fracture.  SIGNATURE  Electronically Signed   By: Ulyess Blossom M.D.   On: 07/28/2013 17:41   Mm Digital Screening Bilateral  07/29/2013   CLINICAL DATA Screening.  EXAM DIGITAL SCREENING BILATERAL MAMMOGRAM WITH CAD  COMPARISON Previous exam(s).  ACR BREAST DENSITY ACR Breast Density Category b: There are scattered areas of fibroglandular density.  FINDINGS There are no findings suspicious for malignancy. Images were processed  with CAD.  IMPRESSION No mammographic evidence of malignancy. A result letter of this screening mammogram will be mailed directly to the patient.  RECOMMENDATION Screening mammogram in one year. (Code:SM-B-01Y)  BI-RADS CATEGORY 1: Negative.  SIGNATURE  Electronically Signed   By: Marin Olp M.D.   On: 07/29/2013 08:41   Procedure: wax removal, B Reason: wax impaction, B Risks and benefits of procedure discussed with the patient who agrees to proceed. Ear(s) irrigated with warm water. Large amount of wax removed. Instrumentation with metal ear loop was performed to accomplish wax removal. the patient tolerated procedure well.     Assessment & Plan:   CPX/v70.0 - Patient has been counseled on age-appropriate routine health concerns for screening and prevention. These are reviewed and up-to-date.  Immunizations are up-to-date or declined. Labs ordered and reviewed.  Upcoming trip to [place] - rx for Cipro and Malarone provided  B cerumen impaction - irrigation today as above -   Problem List Items Addressed This Visit   Diet-controlled type 2 diabetes mellitus      Hx gestational DM Previously on metformin - none since 2010 Check a1c q 81mo to monitor Encouraged continued weight control, diet and exercsie Lab Results  Component Value Date   HGBA1C 6.6* 03/30/2013      Relevant Orders      Hemoglobin A1c      Microalbumin / creatinine urine ratio   Hypothyroidism      On Armour Check TSH q 57mo and adjust dose if needed The current medical regimen is effective;  continue present plan and medications. Lab Results  Component Value Date   TSH 2.24 03/30/2013       Other Visit Diagnoses   Routine general medical examination at a health care facility    -  Primary    Relevant Orders       Basic metabolic panel       CBC with Differential       Hepatic function panel       Lipid panel       TSH    Excessive cerumen in both ear canals

## 2013-09-28 ENCOUNTER — Ambulatory Visit: Payer: 59 | Admitting: Internal Medicine

## 2013-10-01 ENCOUNTER — Other Ambulatory Visit (HOSPITAL_COMMUNITY): Payer: Self-pay | Admitting: Sports Medicine

## 2013-10-01 DIAGNOSIS — M25512 Pain in left shoulder: Secondary | ICD-10-CM

## 2013-10-06 ENCOUNTER — Ambulatory Visit (HOSPITAL_COMMUNITY)
Admission: RE | Admit: 2013-10-06 | Discharge: 2013-10-06 | Disposition: A | Discharge status: Home / Self Care | Payer: 59 | Source: Ambulatory Visit | Attending: Sports Medicine | Admitting: Sports Medicine

## 2013-10-06 DIAGNOSIS — R29898 Other symptoms and signs involving the musculoskeletal system: Secondary | ICD-10-CM | POA: Insufficient documentation

## 2013-10-06 DIAGNOSIS — M719 Bursopathy, unspecified: Secondary | ICD-10-CM | POA: Insufficient documentation

## 2013-10-06 DIAGNOSIS — M75100 Unspecified rotator cuff tear or rupture of unspecified shoulder, not specified as traumatic: Secondary | ICD-10-CM | POA: Insufficient documentation

## 2013-10-06 DIAGNOSIS — IMO0002 Reserved for concepts with insufficient information to code with codable children: Secondary | ICD-10-CM | POA: Insufficient documentation

## 2013-10-06 DIAGNOSIS — M25512 Pain in left shoulder: Secondary | ICD-10-CM

## 2013-10-06 DIAGNOSIS — M67919 Unspecified disorder of synovium and tendon, unspecified shoulder: Secondary | ICD-10-CM | POA: Insufficient documentation

## 2013-10-06 DIAGNOSIS — M25519 Pain in unspecified shoulder: Secondary | ICD-10-CM | POA: Insufficient documentation

## 2013-10-08 ENCOUNTER — Ambulatory Visit: Payer: 59 | Admitting: Sports Medicine

## 2013-10-29 ENCOUNTER — Telehealth: Payer: Self-pay | Admitting: Internal Medicine

## 2013-10-29 NOTE — Telephone Encounter (Signed)
Left message to call back  

## 2013-10-29 NOTE — Telephone Encounter (Signed)
Pt would like to know if the sx she is having normal sx of travelers diarrhea.  She hasint been able to start taking her doxy due to vomiting.  She wanted to know when would be the best time to start this antibiotic. Please advise

## 2013-10-29 NOTE — Telephone Encounter (Signed)
Pt.notified

## 2013-10-29 NOTE — Telephone Encounter (Signed)
Patient Information:  Caller Name: Cheryl Chan  Phone: [phone]  Patient: Cheryl Chan, Cheryl Chan  Gender: Female  DOB: [birth year]  Age: 58 Years  PCP: Gwendolyn Grant (Adults only)  Office Follow Up:  Does the office need to follow up with this patient?: Yes  Instructions For The Office: Asks if she should take Doxycycline that was Rx for traveler's diarrhea and if it is that would she be considered contagious?   Symptoms  Reason For Call & Symptoms: Patient calling about vomiting and diarrhea.  Reports recent travel to [place].  Cramps, diarrhea onset 10/23/13 followed by vomiting.  She felt fine the next day.  Similar episodes 10/28/13.  She estimates 10-12 episodes vomiting  in an hour and "couldn't get off the toilet for an hour".  She currently denies feeling poorly.  She asks if she should take the Doxycycline that was Rx for traveler's diarrhea instead of being seen and if she would be considered contagious.  Reviewed Health History In EMR: Yes  Reviewed Medications In EMR: Yes  Reviewed Allergies In EMR: Yes  Reviewed Surgeries / Procedures: Yes  Date of Onset of Symptoms: 10/23/2013  Guideline(s) Used:  Vomiting  Disposition Per Guideline:   See Today in Office  Reason For Disposition Reached:   Vomiting lasts > 48 hours  Advice Given:  Reassurance:  Vomiting can be caused by many types of illnesses. It can be caused by a stomach flu virus. It can be caused by eating or drinking something that disagreed with your stomach.  Adults with vomiting need to stay hydrated. This is the most important thing. If you don't drink and replace lost fluids, you may get dehydrated.  Clear Liquids:  Sip water or a rehydration drink (e.g., Gatorade or Powerade).  Other options: 1/2 strength flat lemon-lime soda or ginger ale.  After 4 hours without vomiting, increase the amount.  Avoid Nonprescription Medicines:  Stop all nonprescription medicines for 24 hours (Reason: they may make vomiting  worse).  Call if vomiting a prescription medicine.  Expected Course:  Vomiting from viral gastritis usually stops in 12 to 48 hours.  If diarrhea is present, it usually lasts for several days.  People with moderate to severe dehydration may need medical care. signs of this include very dry mouth, dizziness, weakness, and decreased urination.  Call Back If:  Signs of dehydration occur  You become worse.  Patient Refused Recommendation:  Patient Refused Care Advice  Asks if she should take Doxycycline that was Rx for traveler's diarrhea and if it is that would she be considered contagious?

## 2013-10-29 NOTE — Telephone Encounter (Signed)
cipro was rx'd for this purpose, not doxy symptoms sound suspicious for travel's diarrhea Ok to start cipro anytime - take with crackers/soft food Keep hydrated OV if not improving, sooner if worse Would suspect she is noninfectious to others, but also advise stay home until symptoms improve

## 2013-11-03 ENCOUNTER — Encounter: Payer: Self-pay | Admitting: Family Medicine

## 2013-11-03 ENCOUNTER — Ambulatory Visit (INDEPENDENT_AMBULATORY_CARE_PROVIDER_SITE_OTHER): Payer: 59 | Admitting: Family Medicine

## 2013-11-03 VITALS — BP 100/62 | HR 72 | Temp 97.7°F | Ht 62.0 in | Wt 150.0 lb

## 2013-11-03 DIAGNOSIS — J069 Acute upper respiratory infection, unspecified: Secondary | ICD-10-CM

## 2013-11-03 NOTE — Patient Instructions (Signed)
INSTRUCTIONS FOR UPPER RESPIRATORY INFECTION:  -plenty of rest and fluids  -nasal saline wash 2-3 times daily (use prepackaged nasal saline or bottled/distilled water if making your own)   -clean nose with nasal saline before using the nasal steroid or sinex  -can use AFRIN nasal spray for drainage and nasal congestion - but do NOT use longer then 3-4 days  -can use tylenol or ibuprofen as directed for aches and sorethroat  -if you are taking a cough medication - use only as directed, may also try a teaspoon of honey to coat the throat and throat lozenges  -for sore throat, salt water gargles can help  -follow up if you have fevers, facial pain, tooth pain, difficulty breathing or are worsening or not getting better in 5-7 days

## 2013-11-03 NOTE — Progress Notes (Signed)
No chief complaint on file.   HPI:  Acute Visit for: PCP and PCP office not available  1) Cough -started: about 4-5 days ago -symptoms:nasal congestion, sore throat, cough - feeling a little better now -denies:fever, SOB, NVD, tooth pain -has tried: nothing -sick contacts/travel/risks: denies flu exposure, tick exposure or Ebola risks -Hx of: allergies  ROS: See pertinent positives and negatives per HPI.  Past Medical History  Diagnosis Date  . Hypothyroidism   . Hyperlipidemia   . Arthritis   . 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 History of chicken pox   . 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 Endometrial cancer 10/2011 dx    s/p vag hyst 11/2011  . Diet-controlled type 2 diabetes mellitus   . Complex endometrial hyperplasia 2005    Past Surgical History  Procedure Laterality Date  . Cesarean section      x's 4  . Fracture surgery      nose as a child  . Dilation and curettage of uterus      hysteroscopy  . Robotic assisted lap vaginal hysterectomy  12/04/2011    Procedure: ROBOTIC ASSISTED LAPAROSCOPIC VAGINAL HYSTERECTOMY;  Surgeon: Imagene Gurney A. Alycia Rossetti, MD;  Location: [facility];  Service: Gynecology;  Laterality: N/A;  . Open reduction internal fixation (orif) distal phalanx Left 08/05/2012    Procedure: OPEN REDUCTION INTERNAL FIXATION (ORIF) DISTAL PHALANX VERSUS EXTERNAL FIXATION LEFT RING FINGER;  Surgeon: Tennis Must, MD;  Location: [place];  Service: Orthopedics;  Laterality: Left;    Family History  Problem Relation Age of Onset  . Heart disease Brother 47    cardiomyopathy s/p ICD, on transplant list  . Cancer Father 50    prostrate  . Hypertension Father   . Arthritis Mother     History   Social History  . Marital Status: Married    Spouse Name: N/A    Number of Children: N/A  . Years of Education: N/A   Social History Main Topics  . Smoking status: Never Smoker   . Smokeless tobacco: Never Used  . Alcohol Use:  0.5 - 1.0 oz/week    1-2 drink(s) per week  . Drug Use: No  . Sexual Activity: No     Comment: hysterectomy   Other Topics Concern  . 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, nutritional since [place] 90 MG tablet, TAKE 1 Lloyd., Disp: 90 tablet, Rfl: 2;  ciprofloxacin (CIPRO) 500 MG tablet, Take 1 tablet (500 mg total) by mouth 2 (two) times daily., Disp: 14 tablet, Rfl: 0;  fluticasone (FLONASE) 50 MCG/ACT nasal spray, INHALE 2 SPRAYS INTO THE NOSE DAILY AS NEEDED., Disp: 48 g, Rfl: 0 naproxen (NAPROSYN) 250 MG tablet, Take 250 mg by mouth 2 (two) times daily with a meal., Disp: , Rfl: ;  PRESCRIPTION MEDICATION, Currently on antibiotic for nail on foot, Disp: , Rfl: ;  sertraline (ZOLOFT) 100 MG tablet, TAKE 1 TABLET BY MOUTH ONCE DAILY, Disp: 90 tablet, Rfl: 1  EXAM:  Filed Vitals:   11/03/13 1508  BP: 100/62  Pulse: 72  Temp: 97.7 F (36.5 C)    Body mass index is 27.43 kg/(m^2).  GENERAL: vitals reviewed and listed above, alert, oriented, appears well hydrated and in no acute distress  HEENT: atraumatic,  conjunttiva clear, no obvious abnormalities on inspection of external nose and ears, normal appearance of ear canals and TMs, clear nasal congestion, mild post oropharyngeal erythema with PND, no tonsillar edema or exudate, no sinus TTP  NECK: no obvious masses on inspection  LUNGS: clear to auscultation bilaterally, no wheezes, rales or rhonchi, good air movement  CV: HRRR, no peripheral edema  MS: moves all extremities without noticeable abnormality  PSYCH: pleasant and cooperative, no obvious depression or anxiety  ASSESSMENT AND PLAN:  Discussed the following assessment and plan:  Upper respiratory infection  -given HPI and exam findings today, a serious infection or illness is unlikely. We discussed potential etiologies, with  VURI being most likely, and advised supportive care and monitoring. We discussed treatment side effects, likely course, antibiotic misuse, transmission, and signs of developing a serious illness. -of course, we advised to return or notify a doctor immediately if symptoms worsen or persist or new concerns arise.    There are no Patient Instructions on file for this visit.   Cheryl Chan R.

## 2013-11-03 NOTE — Progress Notes (Signed)
Pre visit review using our clinic review tool, if applicable. No additional management support is needed unless otherwise documented below in the visit note. 

## 2013-11-09 NOTE — Telephone Encounter (Signed)
Patient notified in mychart.

## 2013-11-13 ENCOUNTER — Ambulatory Visit (INDEPENDENT_AMBULATORY_CARE_PROVIDER_SITE_OTHER): Payer: 59 | Admitting: Obstetrics & Gynecology

## 2013-11-13 ENCOUNTER — Encounter: Payer: Self-pay | Admitting: Obstetrics & Gynecology

## 2013-11-13 ENCOUNTER — Telehealth: Payer: Self-pay | Admitting: Obstetrics & Gynecology

## 2013-11-13 VITALS — BP 116/64 | HR 64 | Resp 16 | Ht 63.5 in | Wt 150.4 lb

## 2013-11-13 DIAGNOSIS — N952 Postmenopausal atrophic vaginitis: Secondary | ICD-10-CM

## 2013-11-13 DIAGNOSIS — C541 Malignant neoplasm of endometrium: Secondary | ICD-10-CM

## 2013-11-13 DIAGNOSIS — C549 Malignant neoplasm of corpus uteri, unspecified: Secondary | ICD-10-CM

## 2013-11-13 DIAGNOSIS — N950 Postmenopausal bleeding: Secondary | ICD-10-CM

## 2013-11-13 NOTE — Patient Instructions (Signed)
Please call with any new symptoms.  I will notify you of your Pap smear results next week.

## 2013-11-13 NOTE — Telephone Encounter (Signed)
Spoke with patient. Patient is very concerned. Patient awoke last night with "brown discharge that does not have an odor." Patient states that she had this same thing happen when she was diagnosed with uterine cancer. Patient had hysterectomy in 2013. "I have been doing my 6 month rechecks but this has me slightly freaking out." Patient states that she was in [place] and got sick with vomiting and diarrhea. Was placed on Cipro which resolved all symptoms. Patient returned June 9th and has not had any further symptoms. "I am just mentioning that because it was in the same area. But the discharge did not have an odor and was not poop. I will drop everything I have to do to be seen." Appointment scheduled for today at 11:15am with Dr.Miller (time per Gay Filler). Patient agreeable to date and time.  Routing to Kinsleigh Ludolph for final review. Patient agreeable to disposition. Will close encounter

## 2013-11-13 NOTE — Telephone Encounter (Signed)
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 She said she soiled her self last night in her sleep too which is unusual.

## 2013-11-13 NOTE — Progress Notes (Signed)
Subjective:     Patient ID: Cheryl Chan, female   DOB: 1955-08-04, 58 y.o.   MRN: [id number]  HPI 58 yo G5P4 MWF here for complaint of possible vaginal bleeding that reminded her of the bleeding she had right before she was diagnosed with endometrial cancer.  No pain.  No cramping.  Just got back from [place] a couple of weeks ago.  Had a really significant GI issue when she was there.  Was pretty sure she did not have diarrhea this morning.  Was dark brownish, like old blood.  No BRVB.     Review of Systems  All other systems reviewed and are negative.      Objective:   Physical Exam  Constitutional: She is oriented to person, place, and time. She appears well-developed and well-nourished.  Abdominal: Soft. Bowel sounds are normal. She exhibits no distension and no mass. There is no tenderness. There is no rebound and no guarding.  Genitourinary: Vagina normal. There is no rash or tenderness on the right labia. There is no rash or tenderness on the left labia. Right adnexum displays no mass. Left adnexum displays no mass.  Atrophic vaginal changes with typical yellowish/brownish discharge.  Shown to pt.  She's not sure if this is the same thing or not.  Cervix, uterus, and adnexal structures absent.  No masses.  No fullness.  No nodularity.  Pap obtained.  Lymphadenopathy:       Right: No inguinal adenopathy present.       Left: No inguinal adenopathy present.  Neurological: She is alert and oriented to person, place, and time.  Skin: Skin is warm and dry.  Psychiatric: She has a normal mood and affect.       Assessment:     Vaginal bleeding?, atrophic changes  H/O 1A Endometrial adenocarcinoma 9/13    Plan:     Pap pending.  Offered PUS as well.  Pt would like to see pap results.  Also, she may wait to see if this occurs again.  Reassured by physical exam today.

## 2013-11-16 LAB — IPS PAP SMEAR ONLY

## 2013-11-18 ENCOUNTER — Telehealth: Payer: Self-pay | Admitting: Certified Registered Nurse Anesthetist

## 2013-11-18 NOTE — Telephone Encounter (Signed)
Release of medical records and left shoulder scope and rotator cuff repair.

## 2013-11-19 ENCOUNTER — Other Ambulatory Visit: Payer: Self-pay | Admitting: Internal Medicine

## 2013-11-24 ENCOUNTER — Telehealth: Payer: Self-pay

## 2013-11-24 NOTE — Telephone Encounter (Signed)
Release for surgery and medical documentation faxed back to [place]: Cheryl Chan

## 2013-11-25 ENCOUNTER — Encounter (HOSPITAL_BASED_OUTPATIENT_CLINIC_OR_DEPARTMENT_OTHER): Payer: Self-pay | Admitting: *Deleted

## 2013-11-26 ENCOUNTER — Encounter (HOSPITAL_BASED_OUTPATIENT_CLINIC_OR_DEPARTMENT_OTHER): Payer: Self-pay | Admitting: Physician Assistant

## 2013-11-26 DIAGNOSIS — S46009A Unspecified injury of muscle(s) and tendon(s) of the rotator cuff of unspecified shoulder, initial encounter: Secondary | ICD-10-CM

## 2013-11-27 ENCOUNTER — Encounter (HOSPITAL_BASED_OUTPATIENT_CLINIC_OR_DEPARTMENT_OTHER)
Admission: RE | Disposition: A | Discharge status: Home / Self Care | Payer: Self-pay | Source: Ambulatory Visit | Attending: Orthopedic Surgery

## 2013-11-27 ENCOUNTER — Encounter (HOSPITAL_BASED_OUTPATIENT_CLINIC_OR_DEPARTMENT_OTHER): Payer: 59 | Admitting: Anesthesiology

## 2013-11-27 ENCOUNTER — Encounter (HOSPITAL_BASED_OUTPATIENT_CLINIC_OR_DEPARTMENT_OTHER): Payer: Self-pay

## 2013-11-27 ENCOUNTER — Ambulatory Visit (HOSPITAL_BASED_OUTPATIENT_CLINIC_OR_DEPARTMENT_OTHER)
Admission: RE | Admit: 2013-11-27 | Discharge: 2013-11-27 | Disposition: A | Discharge status: Home / Self Care | Payer: 59 | Source: Ambulatory Visit | Attending: Orthopedic Surgery | Admitting: Orthopedic Surgery

## 2013-11-27 ENCOUNTER — Ambulatory Visit (HOSPITAL_BASED_OUTPATIENT_CLINIC_OR_DEPARTMENT_OTHER): Payer: 59 | Admitting: Anesthesiology

## 2013-11-27 DIAGNOSIS — M67919 Unspecified disorder of synovium and tendon, unspecified shoulder: Secondary | ICD-10-CM | POA: Insufficient documentation

## 2013-11-27 DIAGNOSIS — M25819 Other specified joint disorders, unspecified shoulder: Secondary | ICD-10-CM | POA: Insufficient documentation

## 2013-11-27 DIAGNOSIS — E785 Hyperlipidemia, unspecified: Secondary | ICD-10-CM | POA: Insufficient documentation

## 2013-11-27 DIAGNOSIS — F329 Major depressive disorder, single episode, unspecified: Secondary | ICD-10-CM | POA: Insufficient documentation

## 2013-11-27 DIAGNOSIS — Z8542 Personal history of malignant neoplasm of other parts of uterus: Secondary | ICD-10-CM | POA: Insufficient documentation

## 2013-11-27 DIAGNOSIS — M7580 Other shoulder lesions, unspecified shoulder: Secondary | ICD-10-CM

## 2013-11-27 DIAGNOSIS — Z885 Allergy status to narcotic agent status: Secondary | ICD-10-CM | POA: Insufficient documentation

## 2013-11-27 DIAGNOSIS — F411 Generalized anxiety disorder: Secondary | ICD-10-CM | POA: Insufficient documentation

## 2013-11-27 DIAGNOSIS — M719 Bursopathy, unspecified: Secondary | ICD-10-CM | POA: Insufficient documentation

## 2013-11-27 DIAGNOSIS — Z90710 Acquired absence of both cervix and uterus: Secondary | ICD-10-CM | POA: Insufficient documentation

## 2013-11-27 DIAGNOSIS — E039 Hypothyroidism, unspecified: Secondary | ICD-10-CM | POA: Insufficient documentation

## 2013-11-27 DIAGNOSIS — M75120 Complete rotator cuff tear or rupture of unspecified shoulder, not specified as traumatic: Secondary | ICD-10-CM | POA: Insufficient documentation

## 2013-11-27 DIAGNOSIS — Z888 Allergy status to other drugs, medicaments and biological substances status: Secondary | ICD-10-CM | POA: Insufficient documentation

## 2013-11-27 DIAGNOSIS — F3289 Other specified depressive episodes: Secondary | ICD-10-CM | POA: Insufficient documentation

## 2013-11-27 DIAGNOSIS — E119 Type 2 diabetes mellitus without complications: Secondary | ICD-10-CM | POA: Insufficient documentation

## 2013-11-27 DIAGNOSIS — S46009A Unspecified injury of muscle(s) and tendon(s) of the rotator cuff of unspecified shoulder, initial encounter: Secondary | ICD-10-CM

## 2013-11-27 HISTORY — PX: SHOULDER ARTHROSCOPY WITH ROTATOR CUFF REPAIR AND SUBACROMIAL DECOMPRESSION: SHX5686

## 2013-11-27 HISTORY — DX: Unspecified injury of muscle(s) and tendon(s) of the rotator cuff of unspecified shoulder, initial encounter: S46.009A

## 2013-11-27 HISTORY — DX: Anxiety disorder, unspecified: F41.9

## 2013-11-27 LAB — POCT HEMOGLOBIN-HEMACUE: Hemoglobin: 11.4 g/dL — ABNORMAL LOW (ref 12.0–15.0)

## 2013-11-27 SURGERY — SHOULDER ARTHROSCOPY WITH ROTATOR CUFF REPAIR AND SUBACROMIAL DECOMPRESSION
Anesthesia: General | Site: Shoulder | Laterality: Left

## 2013-11-27 MED ORDER — METOCLOPRAMIDE HCL 5 MG/ML IJ SOLN: 5.0000 mg | Freq: Three times a day (TID) | INTRAMUSCULAR | Status: DC | PRN

## 2013-11-27 MED ORDER — OXYCODONE HCL 5 MG/5ML PO SOLN: 5.0000 mg | Freq: Once | ORAL | Status: DC | PRN

## 2013-11-27 MED ORDER — ONDANSETRON HCL 4 MG/2ML IJ SOLN: 4.0000 mg | Freq: Four times a day (QID) | INTRAMUSCULAR | Status: DC | PRN

## 2013-11-27 MED ORDER — PROPOFOL 10 MG/ML IV BOLUS
INTRAVENOUS | Status: DC | PRN
  Administered 2013-11-27: 200 mg via INTRAVENOUS

## 2013-11-27 MED ORDER — FENTANYL CITRATE 0.05 MG/ML IJ SOLN
INTRAMUSCULAR | Status: AC
  Filled 2013-11-27: qty 2

## 2013-11-27 MED ORDER — SUCCINYLCHOLINE CHLORIDE 20 MG/ML IJ SOLN
INTRAMUSCULAR | Status: DC | PRN
  Administered 2013-11-27: 100 mg via INTRAVENOUS

## 2013-11-27 MED ORDER — LIDOCAINE HCL (CARDIAC) 20 MG/ML IV SOLN
INTRAVENOUS | Status: DC | PRN
  Administered 2013-11-27: 40 mg via INTRAVENOUS

## 2013-11-27 MED ORDER — OXYCODONE-ACETAMINOPHEN 5-325 MG PO TABS: 1.0000 | ORAL_TABLET | ORAL | Status: DC | PRN

## 2013-11-27 MED ORDER — FENTANYL CITRATE 0.05 MG/ML IJ SOLN
INTRAMUSCULAR | Status: DC | PRN
Start: 2013-11-27 — End: 2013-11-27
  Administered 2013-11-27: 50 ug via INTRAVENOUS

## 2013-11-27 MED ORDER — PROMETHAZINE HCL 25 MG/ML IJ SOLN: 6.2500 mg | INTRAMUSCULAR | Status: DC | PRN

## 2013-11-27 MED ORDER — OXYCODONE HCL 5 MG PO TABS
5.0000 mg | ORAL_TABLET | Freq: Once | ORAL | Status: DC | PRN
Start: 2013-11-27 — End: 2013-11-27

## 2013-11-27 MED ORDER — HYDROMORPHONE HCL PF 1 MG/ML IJ SOLN: 0.5000 mg | INTRAMUSCULAR | Status: DC | PRN

## 2013-11-27 MED ORDER — METHOCARBAMOL 1000 MG/10ML IJ SOLN: 500.0000 mg | Freq: Four times a day (QID) | INTRAVENOUS | Status: DC | PRN

## 2013-11-27 MED ORDER — BUPIVACAINE-EPINEPHRINE (PF) 0.5% -1:200000 IJ SOLN
INTRAMUSCULAR | Status: DC | PRN
  Administered 2013-11-27: 30 mL via PERINEURAL

## 2013-11-27 MED ORDER — METOCLOPRAMIDE HCL 5 MG PO TABS: 5.0000 mg | ORAL_TABLET | Freq: Three times a day (TID) | ORAL | Status: DC | PRN

## 2013-11-27 MED ORDER — METHOCARBAMOL 500 MG PO TABS: 500.0000 mg | ORAL_TABLET | Freq: Four times a day (QID) | ORAL | Status: DC | PRN

## 2013-11-27 MED ORDER — MIDAZOLAM HCL 2 MG/2ML IJ SOLN
INTRAMUSCULAR | Status: AC
  Filled 2013-11-27: qty 2

## 2013-11-27 MED ORDER — ONDANSETRON HCL 4 MG/2ML IJ SOLN
INTRAMUSCULAR | Status: DC | PRN
  Administered 2013-11-27: 4 mg via INTRAVENOUS

## 2013-11-27 MED ORDER — FENTANYL CITRATE 0.05 MG/ML IJ SOLN
50.0000 ug | INTRAMUSCULAR | Status: DC | PRN
  Administered 2013-11-27: 100 ug via INTRAVENOUS

## 2013-11-27 MED ORDER — DEXAMETHASONE SODIUM PHOSPHATE 4 MG/ML IJ SOLN
INTRAMUSCULAR | Status: DC | PRN
  Administered 2013-11-27: 10 mg via INTRAVENOUS

## 2013-11-27 MED ORDER — CEFAZOLIN SODIUM-DEXTROSE 2-3 GM-% IV SOLR
INTRAVENOUS | Status: AC
  Filled 2013-11-27: qty 50

## 2013-11-27 MED ORDER — ONDANSETRON HCL 4 MG PO TABS: 4.0000 mg | ORAL_TABLET | Freq: Four times a day (QID) | ORAL | Status: DC | PRN

## 2013-11-27 MED ORDER — CHLORHEXIDINE GLUCONATE 4 % EX LIQD: 60.0000 mL | Freq: Once | CUTANEOUS | Status: DC

## 2013-11-27 MED ORDER — HYDROMORPHONE HCL PF 1 MG/ML IJ SOLN
0.2500 mg | INTRAMUSCULAR | Status: DC | PRN
Start: 2013-11-27 — End: 2013-11-27

## 2013-11-27 MED ORDER — CEFAZOLIN SODIUM-DEXTROSE 2-3 GM-% IV SOLR
2.0000 g | INTRAVENOUS | Status: AC
  Administered 2013-11-27: 2 g via INTRAVENOUS

## 2013-11-27 MED ORDER — LACTATED RINGERS IV SOLN
INTRAVENOUS | Status: DC
  Administered 2013-11-27: 11:00:00 via INTRAVENOUS

## 2013-11-27 MED ORDER — MIDAZOLAM HCL 2 MG/2ML IJ SOLN
1.0000 mg | INTRAMUSCULAR | Status: DC | PRN
  Administered 2013-11-27: 2 mg via INTRAVENOUS

## 2013-11-27 SURGICAL SUPPLY — 79 items
ANCH SUT SWLK 19.1X4.75 (Anchor) ×1 IMPLANT
ANCHOR SUT BIO SW 4.75X19.1 (Anchor) ×1 IMPLANT
APL SKNCLS STERI-STRIP NONHPOA (GAUZE/BANDAGES/DRESSINGS)
BENZOIN TINCTURE PRP APPL 2/3 (GAUZE/BANDAGES/DRESSINGS) IMPLANT
BLADE CUDA 5.5 (BLADE) IMPLANT
BLADE CUTTER GATOR 3.5 (BLADE) ×2 IMPLANT
BLADE GREAT WHITE 4.2 (BLADE) IMPLANT
BLADE SURG 15 STRL LF DISP TIS (BLADE) IMPLANT
BLADE SURG 15 STRL SS (BLADE)
BLADE SURG ROTATE 9660 (MISCELLANEOUS) IMPLANT
BNDG COHESIVE 4X5 TAN STRL (GAUZE/BANDAGES/DRESSINGS) IMPLANT
BUR OVAL 6.0 (BURR) ×2 IMPLANT
CANISTER SUCT 3000ML (MISCELLANEOUS) IMPLANT
CANNULA TWIST IN 8.25X7CM (CANNULA) ×2 IMPLANT
DECANTER SPIKE VIAL GLASS SM (MISCELLANEOUS) IMPLANT
DRAPE SHOULDER BEACH CHAIR (DRAPES) ×2 IMPLANT
DRAPE U-SHAPE 47X51 STRL (DRAPES) ×4 IMPLANT
DRSG PAD ABDOMINAL 8X10 ST (GAUZE/BANDAGES/DRESSINGS) ×2 IMPLANT
DURAPREP 26ML APPLICATOR (WOUND CARE) ×2 IMPLANT
ELECT REM PT RETURN 9FT ADLT (ELECTROSURGICAL) ×2
ELECTRODE REM PT RTRN 9FT ADLT (ELECTROSURGICAL) ×1 IMPLANT
GAUZE SPONGE 4X4 12PLY STRL (GAUZE/BANDAGES/DRESSINGS) ×2 IMPLANT
GAUZE XEROFORM 1X8 LF (GAUZE/BANDAGES/DRESSINGS) ×2 IMPLANT
GLOVE BIO SURGEON STRL SZ 6.5 (GLOVE) ×1 IMPLANT
GLOVE BIO SURGEON STRL SZ7 (GLOVE) IMPLANT
GLOVE BIOGEL PI IND STRL 7.0 (GLOVE) IMPLANT
GLOVE BIOGEL PI IND STRL 7.5 (GLOVE) ×1 IMPLANT
GLOVE BIOGEL PI INDICATOR 7.0 (GLOVE) ×2
GLOVE BIOGEL PI INDICATOR 7.5 (GLOVE) ×1
GLOVE SS BIOGEL STRL SZ 7.5 (GLOVE) ×1 IMPLANT
GLOVE SUPERSENSE BIOGEL SZ 7.5 (GLOVE) ×1
GOWN STRL REUS W/ TWL LRG LVL3 (GOWN DISPOSABLE) ×3 IMPLANT
GOWN STRL REUS W/TWL LRG LVL3 (GOWN DISPOSABLE) ×6
LOOP 2 FIBERLINK CLOSED (SUTURE) IMPLANT
MANIFOLD NEPTUNE II (INSTRUMENTS) ×2 IMPLANT
NDL 1/2 CIR CATGUT .05X1.09 (NEEDLE) IMPLANT
NDL SAFETY ECLIPSE 18X1.5 (NEEDLE) ×1 IMPLANT
NDL SCORPION MULTI FIRE (NEEDLE) IMPLANT
NDL SUT 6 .5 CRC .975X.05 MAYO (NEEDLE) IMPLANT
NEEDLE 1/2 CIR CATGUT .05X1.09 (NEEDLE) IMPLANT
NEEDLE HYPO 18GX1.5 SHARP (NEEDLE) ×2
NEEDLE MAYO TAPER (NEEDLE)
NEEDLE SCORPION MULTI FIRE (NEEDLE) ×2 IMPLANT
PACK ARTHROSCOPY DSU (CUSTOM PROCEDURE TRAY) ×2 IMPLANT
PACK BASIN DAY SURGERY FS (CUSTOM PROCEDURE TRAY) ×2 IMPLANT
PAD ALCOHOL SWAB (MISCELLANEOUS) ×2 IMPLANT
PENCIL BUTTON HOLSTER BLD 10FT (ELECTRODE) IMPLANT
SET ARTHROSCOPY TUBING (MISCELLANEOUS) ×2
SET ARTHROSCOPY TUBING LN (MISCELLANEOUS) ×1 IMPLANT
SHEET MEDIUM DRAPE 40X70 STRL (DRAPES) IMPLANT
SLEEVE SCD COMPRESS KNEE MED (MISCELLANEOUS) ×1 IMPLANT
SLING ARM IMMOBILIZER MED (SOFTGOODS) IMPLANT
SLING ARM LRG ADULT FOAM STRAP (SOFTGOODS) IMPLANT
SLING ARM MED ADULT FOAM STRAP (SOFTGOODS) IMPLANT
SLING ARM XL FOAM STRAP (SOFTGOODS) IMPLANT
SLING ULTRA III MED (ORTHOPEDIC SUPPLIES) ×1 IMPLANT
SPONGE LAP 4X18 X RAY DECT (DISPOSABLE) IMPLANT
STRIP CLOSURE SKIN 1/2X4 (GAUZE/BANDAGES/DRESSINGS) IMPLANT
SUCTION FRAZIER TIP 10 FR DISP (SUCTIONS) IMPLANT
SUT ETHILON 3 0 PS 1 (SUTURE) ×2 IMPLANT
SUT FIBERWIRE #2 38 T-5 BLUE (SUTURE)
SUT PDS AB 2-0 CT2 27 (SUTURE) IMPLANT
SUT PROLENE 3 0 PS 2 (SUTURE) IMPLANT
SUT TIGER TAPE 7 IN WHITE (SUTURE) IMPLANT
SUT VIC AB 0 SH 27 (SUTURE) IMPLANT
SUT VIC AB 2-0 PS2 27 (SUTURE) IMPLANT
SUT VIC AB 2-0 SH 27 (SUTURE)
SUT VIC AB 2-0 SH 27XBRD (SUTURE) IMPLANT
SUTURE FIBERWR #2 38 T-5 BLUE (SUTURE) IMPLANT
SYR BULB 3OZ (MISCELLANEOUS) IMPLANT
SYRINGE 20CC LL (MISCELLANEOUS) IMPLANT
SYRINGE 6CC (MISCELLANEOUS) ×2 IMPLANT
TAPE FIBER 2MM 7IN #2 BLUE (SUTURE) IMPLANT
TAPE HYPAFIX 6X30 (GAUZE/BANDAGES/DRESSINGS) ×1 IMPLANT
TAPE STRIPS DRAPE STRL (GAUZE/BANDAGES/DRESSINGS) ×2 IMPLANT
TOWEL OR 17X24 6PK STRL BLUE (TOWEL DISPOSABLE) ×2 IMPLANT
TUBE CONNECTING 20X1/4 (TUBING) IMPLANT
WAND STAR VAC 90 (SURGICAL WAND) ×2 IMPLANT
WATER STERILE IRR 1000ML POUR (IV SOLUTION) ×2 IMPLANT

## 2013-11-27 NOTE — Anesthesia Procedure Notes (Addendum)
Anesthesia Regional Block:  Interscalene brachial plexus block  Pre-Anesthetic Checklist: ,, timeout performed, Correct Patient, Correct Site, Correct Laterality, Correct Procedure, Correct Position, site marked, Risks and benefits discussed,  Surgical consent,  Pre-op evaluation,  At surgeon's request and post-op pain management  Laterality: Left  Prep: chloraprep       Needles:  Injection technique: Single-shot  Needle Type: Echogenic Stimulator Needle     Needle Length: 5cm 5 cm Needle Gauge: 22 and 22 G    Additional Needles:  Procedures: ultrasound guided (picture in chart) and nerve stimulator Interscalene brachial plexus block  Nerve Stimulator or Paresthesia:  Response: bicep contraction, 0.45 mA,   Additional Responses:   Narrative:  Start time: 11/27/2013 11:43 AM End time: 11/27/2013 11:53 AM Injection made incrementally with aspirations every 5 mL.  Performed by: Personally  Anesthesiologist: J. Tamela Gammon, MD  Additional Notes: Functioning IV was confirmed and monitors applied.  A 22mm 22ga echogenic arrow stimulator was used. Sterile prep and drape,hand hygiene and sterile gloves were used.Ultrasound guidance: relevant anatomy identified, needle position confirmed, local anesthetic spread visualized around nerve(s)., vascular puncture avoided.  Image printed for medical record.  Negative aspiration and negative test dose prior to incremental administration of local anesthetic. The patient tolerated the procedure well.   Procedure Name: Intubation Performed by: Terrance Mass Pre-anesthesia Checklist: Patient identified, Timeout performed, Emergency Drugs available, Suction available and Patient being monitored Patient Re-evaluated:Patient Re-evaluated prior to inductionOxygen Delivery Method: Circle system utilized Preoxygenation: Pre-oxygenation with 100% oxygen Intubation Type: IV induction Ventilation: Mask ventilation without difficulty Laryngoscope  Size: Miller and 2 Grade View: Grade III Tube type: Oral Tube size: 7.0 mm Number of attempts: 2 Airway Equipment and Method: Stylet Placement Confirmation: ETT inserted through vocal cords under direct vision,  breath sounds checked- equal and bilateral and positive ETCO2 Secured at: 22 cm Tube secured with: Tape Dental Injury: Teeth and Oropharynx as per pre-operative assessment

## 2013-11-27 NOTE — Progress Notes (Signed)
Assisted Dr. Singer with left, ultrasound guided, interscalene  block. Side rails up, monitors on throughout procedure. See vital signs in flow sheet. Tolerated Procedure well.  

## 2013-11-27 NOTE — Discharge Instructions (Signed)
Shouder arthroscopy, rotator cuff repair, subacromial decompression Care After Instructions Refer to this sheet in the next few weeks. These discharge instructions provide you with general information on caring for yourself after you leave the hospital. Your caregiver may also give you specific instructions. Your treatment has been planned according to the most current medical practices available, but unavoidable complications sometimes occur. If you have any problems or questions after discharge, please call your caregiver. HOME INSTRUCTIONS You may resume a normal diet and activities as directed. Perform pendulum exercises as directed. You will start physical therapy 2-4 days after surgery Take showers instead of baths until informed otherwise.  Change bandages (dressings) in 3 days.  Swab wounds daily with betadine.  Wash shoulder with soap and water.  Pat dry.  Cover wounds with bandaids. Only take over-the-counter or prescription medicines for pain, discomfort, or fever as directed by your caregiver.  Wear your sling for the next 6 weeks unless otherwise instructed. Eat a well-balanced diet.  Avoid lifting or driving until you are instructed otherwise.  Make an appointment to see your caregiver for stitches (suture) or staple removal as directed.   SEEK MEDICAL CARE IF: You have swelling of your calf or leg.  You develop shortness of breath or chest pain.  You have redness, swelling, or increasing pain in the wound.  There is pus or any unusual drainage coming from the surgical site.  You notice a bad smell coming from the surgical site or dressing.  The surgical site breaks open after sutures or staples have been removed.  There is persistent bleeding from the suture or staple line.  You are getting worse or are not improving.  You have any other questions or concerns.  SEEK IMMEDIATE MEDICAL CARE IF:  You have a fever greater than 101 You develop a rash.  You have difficulty  breathing.  You develop any reaction or side effects to medicines given.  Your knee motion is decreasing rather than improving.  MAKE SURE YOU:  Understand these instructions.  Will watch your condition.  Will get help right away if you are not doing well or get worse.   Regional Anesthesia Blocks  1. Numbness or the inability to move the "blocked" extremity may last from 3-48 hours after placement. The length of time depends on the medication injected and your individual response to the medication. If the numbness is not going away after 48 hours, call your surgeon.  2. The extremity that is blocked will need to be protected until the numbness is gone and the  Strength has returned. Because you cannot feel it, you will need to take extra care to avoid injury. Because it may be weak, you may have difficulty moving it or using it. You may not know what position it is in without looking at it while the block is in effect.  3. For blocks in the legs and feet, returning to weight bearing and walking needs to be done carefully. You will need to wait until the numbness is entirely gone and the strength has returned. You should be able to move your leg and foot normally before you try and bear weight or walk. You will need someone to be with you when you first try to ensure you do not fall and possibly risk injury.  4. Bruising and tenderness at the needle site are common side effects and will resolve in a few days.  5. Persistent numbness or new problems with movement should be communicated  to the surgeon or the [facility] [phone] [place] [phone]).  Post Anesthesia Home Care Instructions  Activity: Get plenty of rest for the remainder of the day. A responsible adult should stay with you for 24 hours following the procedure.  For the next 24 hours, DO NOT: -Drive a car -Paediatric nurse -Drink alcoholic beverages -Take any medication unless instructed  by your physician -Make any legal decisions or sign important papers.  Meals: Start with liquid foods such as gelatin or soup. Progress to regular foods as tolerated. Avoid greasy, spicy, heavy foods. If nausea and/or vomiting occur, drink only clear liquids until the nausea and/or vomiting subsides. Call your physician if vomiting continues.  Special Instructions/Symptoms: Your throat may feel dry or sore from the anesthesia or the breathing tube placed in your throat during surgery. If this causes discomfort, gargle with warm salt water. The discomfort should disappear within 24 hours.

## 2013-11-27 NOTE — Transfer of Care (Signed)
Immediate Anesthesia Transfer of Care Note  Patient: Cheryl Chan  Procedure(s) Performed: Procedure(s): LEFT SHOULDER ARTHROSCOPY, SUBACROMIAL DECOMPRESSION, PARTIAL ACROMIOPLASTY WITH  CORACOACROMIAL RELEASE, DISTAL CLAVICULECTOMY WITH ROTATOR CUFF REPAIR AND EXTENSIVE DEBRIDEMENT (Left)  Patient Location: PACU  Anesthesia Type:General  Level of Consciousness: awake and sedated  Airway & Oxygen Therapy: Patient Spontanous Breathing and Patient connected to face mask oxygen  Post-op Assessment: Report given to PACU RN and Post -op Vital signs reviewed and stable  Post vital signs: Reviewed and stable  Complications: No apparent anesthesia complications

## 2013-11-27 NOTE — H&P (Signed)
Cheryl Chan is an 58 y.o. female.   Chief Complaint: left shoulder pain HPI: [place] 3 month ago injuring her left shoulder MRI show rotator cuff tear  Past Medical History  Diagnosis Date  . Hypothyroidism   . Hyperlipidemia   . Arthritis   . 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 History of chicken pox   . 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 Endometrial cancer 10/2011 dx    s/p vag hyst 11/2011  . Diet-controlled type 2 diabetes mellitus   . Complex endometrial hyperplasia 2005  . Anxiety   . Rotator cuff injury     Past Surgical History  Procedure Laterality Date  . Cesarean section      x's 4  . Fracture surgery      nose as a child  . Dilation and curettage of uterus      hysteroscopy  . Robotic assisted lap vaginal hysterectomy  12/04/2011    Procedure: ROBOTIC ASSISTED LAPAROSCOPIC VAGINAL HYSTERECTOMY;  Surgeon: Imagene Gurney A. Alycia Rossetti, MD;  Location: [facility];  Service: Gynecology;  Laterality: N/A;  . Open reduction internal fixation (orif) distal phalanx Left 08/05/2012    Procedure: OPEN REDUCTION INTERNAL FIXATION (ORIF) DISTAL PHALANX VERSUS EXTERNAL FIXATION LEFT RING FINGER;  Surgeon: Tennis Must, MD;  Location: [place];  Service: Orthopedics;  Laterality: Left;  . Abdominal hysterectomy      Family History  Problem Relation Age of Onset  . Heart disease Brother 63    cardiomyopathy s/p ICD, on transplant list  . Cancer Father 72    prostrate  . Hypertension Father   . Arthritis Mother    Social History:  reports that she has never smoked. She has never used smokeless tobacco. She reports that she drinks about .5 - 1 ounces of alcohol per week. She reports that she does not use illicit drugs.  Allergies:  Allergies  Allergen Reactions  . Crestor [Rosuvastatin Calcium]     Muscle ache  . Demerol [Meperidine]   . Statins     No prescriptions prior to admission   No current facility-administered medications on file prior to  encounter.   Current Outpatient Prescriptions on File Prior to Encounter  Medication Sig Dispense Refill  . ARMOUR THYROID 90 MG tablet TAKE 1 TABLET BY MOUTH DAILY.  90 tablet  2  . fluticasone (FLONASE) 50 MCG/ACT nasal spray INHALE 2 SPRAYS INTO THE NOSE DAILY AS NEEDED.  48 g  0  . DICLOFENAC PO Take 75 mg by mouth 2 (two) times daily.         No results found for this or any previous visit (from the past 48 hour(s)). No results found.  Review of Systems  Constitutional: Negative.   HENT: Negative.   Eyes: Negative.   Respiratory: Negative.   Gastrointestinal: Negative.   Genitourinary: Negative.   Musculoskeletal: Positive for joint pain.       Left shoulder   Skin: Negative.   Neurological: Negative.   Endo/Heme/Allergies: Negative.   Psychiatric/Behavioral: Negative.     Height 5\' 2"  (1.575 m), weight 68.04 kg (150 lb), last menstrual period 05/22/2003. Physical Exam  Constitutional: She appears well-developed and well-nourished.  HENT:  Head: Normocephalic and atraumatic.  Mouth/Throat: Oropharynx is clear and moist.  Eyes: Pupils are equal, round, and reactive to light.  Neck: Neck supple.  Cardiovascular: Normal rate.   Respiratory: Effort normal.  GI: Soft.  Genitourinary:  Not pertinent to current symptomatology therefore not examined.   Musculoskeletal:  Examination of the left shoulder reveals ff 160 with pain and mild weakness abduction 160 with pain and mild weakness.  Internal and external rotation of 70 degrees with pain and mild weakness no instablility.  Right shoulder has full range of motion without pain or instability. Radial pulse 2+  Neurological: She is alert.  Skin: Skin is warm.  Psychiatric: She has a normal mood and affect.     Assessment/ Patient Active Problem List   Diagnosis Date Noted  . Rotator cuff injury   . Uterine cancer 11/12/2011  . Diet-controlled type 2 diabetes mellitus   . Hypothyroidism   . Hyperlipidemia   .  Depression   . Impacted cerumen of both ears     Plan Left shoulder arthroscopy with SAD and DCE and possible rotator cuff repair.  The risks, benefits, and possible complications of the procedure were discussed in detail with the patient.  The patient is without question.  Cheryl Chan 11/27/2013, 8:35 AM

## 2013-11-27 NOTE — Anesthesia Postprocedure Evaluation (Signed)
Anesthesia Post Note  Patient: Cheryl Chan  Procedure(s) Performed: Procedure(s) (LRB): LEFT SHOULDER ARTHROSCOPY, SUBACROMIAL DECOMPRESSION, PARTIAL ACROMIOPLASTY WITH  CORACOACROMIAL RELEASE, DISTAL CLAVICULECTOMY WITH ROTATOR CUFF REPAIR AND EXTENSIVE DEBRIDEMENT (Left)  Anesthesia type: general  Patient location: PACU  Post pain: Pain level controlled  Post assessment: Patient's Cardiovascular Status Stable  Last Vitals:  Filed Vitals:   11/27/13 1400  BP: 129/70  Pulse: 79  Temp:   Resp: 13    Post vital signs: Reviewed and stable  Level of consciousness: sedated  Complications: No apparent anesthesia complications

## 2013-11-27 NOTE — Anesthesia Preprocedure Evaluation (Signed)
Anesthesia Evaluation  Patient identified by MRN, date of birth, ID band Patient awake    Reviewed: Allergy & Precautions, H&P , NPO status , Patient's Chart, lab work & pertinent test results  Airway Mallampati: II TM Distance: >3 FB Neck ROM: full    Dental  (+) Teeth Intact, Dental Advidsory Given   Pulmonary neg pulmonary ROS,  breath sounds clear to auscultation        Cardiovascular negative cardio ROS  Rhythm:regular Rate:Normal     Neuro/Psych negative neurological ROS  negative psych ROS   GI/Hepatic negative GI ROS, Neg liver ROS,   Endo/Other  diabetesHypothyroidism   Renal/GU negative Renal ROS     Musculoskeletal   Abdominal   Peds  Hematology   Anesthesia Other Findings   Reproductive/Obstetrics negative OB ROS                           Anesthesia Physical Anesthesia Plan  ASA: II  Anesthesia Plan: General ETT   Post-op Pain Management: MAC Combined w/ Regional for Post-op pain   Induction:   Airway Management Planned:   Additional Equipment:   Intra-op Plan:   Post-operative Plan:   Informed Consent: I have reviewed the patients History and Physical, chart, labs and discussed the procedure including the risks, benefits and alternatives for the proposed anesthesia with the patient or authorized representative who has indicated his/her understanding and acceptance.   Dental Advisory Given  Plan Discussed with: Anesthesiologist, CRNA and Surgeon  Anesthesia Plan Comments:         Anesthesia Quick Evaluation

## 2013-11-27 NOTE — Interval H&P Note (Signed)
History and Physical Interval Note:  11/27/2013 12:09 PM  Cheryl Chan  has presented today for surgery, with the diagnosis of Left Shoulder: Impingement Syndrome - Shoulder Degenerative Arthritis - Shoulder, A/C Joint, Complete Rupture of Rotator Cuff, Disorder Articular Cartilage - Shoulder  The various methods of treatment have been discussed with the patient and family. After consideration of risks, benefits and other options for treatment, the patient has consented to  Procedure(s): LEFT SHOULDER ARTHROSCOPY, SUBACROMIAL DECOMPRESSION, PARTIAL ACROMIOPLASTY WITH  CORACOACROMIAL RELEASE, DISTAL CLAVICULECTOMY WITH ROTATOR CUFF REPAIR AND EXTENSIVE DEBRIDEMENT (Left) as a surgical intervention .  The patient's history has been reviewed, patient examined, no change in status, stable for surgery.  I have reviewed the patient's chart and labs.  Questions were answered to the patient's satisfaction.     [name]

## 2013-11-28 NOTE — Op Note (Signed)
NAME:  Cheryl Chan, Cheryl Chan NO.:  [id number]  MEDICAL RECORD NO.:  [id number]  LOCATION:                               FACILITY:  [facility]  PHYSICIAN:  Audree Camel. Noemi Chapel, M.D. DATE OF BIRTH:  1955/11/25  DATE OF PROCEDURE:  11/27/2013 DATE OF DISCHARGE:  11/27/2013                              OPERATIVE REPORT   PREOPERATIVE DIAGNOSES: 1. Left shoulder rotator cuff tear. 2. Left shoulder partial labrum tear. 3. Left shoulder impingement.  POSTOPERATIVE DIAGNOSES: 1. Left shoulder rotator cuff tear. 2. Left shoulder partial labrum tear. 3. Left shoulder impingement.  PROCEDURE: 1. Left shoulder examination under anaesthesia followed by     arthroscopically-assisted rotator cuff repair using Arthrex swivel     lock anchor x1 and fiber tape x1. 2. Left shoulder partial labrum tear debridement. 3. Left shoulder subacromial decompression.  SURGEON:  Audree Camel. Noemi Chapel, M.D.  ASSISTANT:  Kirstin Shepperson, PA-C.  ANESTHESIA:  General.  OPERATIVE TIME:  1 hour.  COMPLICATIONS:  None.  INDICATION FOR PROCEDURE:  [name] is a 58 year old, who has had significant left shoulder pain for the past 6-8 months increasing in nature with exam and MRI documenting partial versus complete rotator cuff tear with impingement.  She has failed conservative care and is now to undergo arthroscopy and repair.  DESCRIPTION OF PROCEDURE:  [name] was brought to the operating room on November 27, 2013, after an interscalene block was placed on holding room by anesthesia.  She was placed on operative table in supine position.  She received antibiotics preoperatively for prophylaxis.  After being placed under general anesthesia, her left shoulder was examined.  She had full range of motion and her shoulder was stable to ligamentous exam.  She was then placed in beach chair position and her shoulder and arm was prepped using sterile DuraPrep and draped using sterile technique.  Time- out  procedure was called and the correct left shoulder identified. Initially, through a posterior arthroscopic portal, the arthroscope with a pump attached was placed into an anterior portal and arthroscopic probe was placed.  On initial inspection, the articular cartilage in the glenohumeral joint was intact.  She had partial tearing of the anterior and superior labrum of 25% which was debrided.  The anterior-inferior labrum and anterior-inferior glenohumeral ligament complex was intact. Biceps tendon anchor and biceps tendon was intact.  The rotator cuff showed a complete tear of the supraspinatus.  The subscap showed a partial tear 25-30%, which was debrided.  The rest of the rotator cuff was intact.  Inferior capsular recess was free of pathology. Subacromial space was entered and a lateral arthroscopic portal was made.  Moderately thickened bursitis was resected.  Impingement was noted and a subacromial decompression was carried out removing 6-8 mm of the undersurface of the anterior, anterolateral, and anteromedial acromion and CA ligament release carried out as well.  The [facility] joint was not pathologic and thus was not resected.  At this point, the rotator cuff tear was repaired through an accessory lateral portal.  One Arthrex swivel lock anchor and fiber tape was placed in a mattress suture technique and then the anchor was deployed on  the lateral aspect of the greater tuberosity with firm and tight fixation.  This completely repaired the tear back down to its anatomic position with excellent fixation.  The shoulder could be brought through full range of motion with no impingement on the repair after this was done.  At this point, she felt that all pathology had been satisfactorily addressed.  The instruments were removed.  Portals closed with 3-0 nylon suture. Sterile dressings were applied in the sling and the patient awakened and taken to recovery room in stable condition.  FOLLOWUP  CARE:  [name] will be followed as an outpatient on OxyIR and Valium with early physical therapy.  She will be seen back in office in a week for sutures out and followup.     Jian Hodgman A. Noemi Chapel, M.D.     RAW/MEDQ  D:  11/27/2013  T:  11/28/2013  Job:  [id number]

## 2013-11-28 NOTE — Op Note (Deleted)
NAME:  Cheryl Chan, Cheryl Chan NO.:  [id number]  MEDICAL RECORD NO.:  [id number]  LOCATION:                               FACILITY:  [facility]  PHYSICIAN:  Audree Camel. Noemi Chapel, M.D. DATE OF BIRTH:  August 08, 1955  DATE OF PROCEDURE:  11/27/2013 DATE OF DISCHARGE:  11/27/2013                              OPERATIVE REPORT   PREOPERATIVE DIAGNOSES: 1. Left shoulder rotator cuff tear. 2. Left shoulder partial labrum tear. 3. Left shoulder impingement.  POSTOPERATIVE DIAGNOSES: 1. Left shoulder rotator cuff tear. 2. Left shoulder partial labrum tear. 3. Left shoulder impingement.  PROCEDURE: 1. Left shoulder examination under anaesthesia followed by     arthroscopically-assisted rotator cuff repair using Arthrex swivel     lock anchor x1 and fiber tape x1. 2. Left shoulder partial labrum tear debridement. 3. Left shoulder subacromial decompression.  SURGEON:  Audree Camel. Noemi Chapel, M.D.  ASSISTANT:  Kirstin Shepperson, PA-C.  ANESTHESIA:  General.  OPERATIVE TIME:  1 hour.  COMPLICATIONS:  None.  INDICATION FOR PROCEDURE:  Cheryl Chan is a 58 year old, who has had significant left shoulder pain for the past 6-8 months increasing in nature with exam and MRI documenting partial versus complete rotator cuff tear with impingement.  She has failed conservative care and is now to undergo arthroscopy and repair.  DESCRIPTION OF PROCEDURE:  Cheryl Chan was brought to the operating room on November 27, 2013, after an interscalene block was placed on holding room by anesthesia.  She was placed on operative table in supine position.  She received antibiotics preoperatively for prophylaxis.  After being placed under general anesthesia, her left shoulder was examined.  She had full range of motion and her shoulder was stable to ligamentous exam.  She was then placed in beach chair position and her shoulder and arm was prepped using sterile DuraPrep and draped using sterile technique.  Time- out  procedure was called and the correct left shoulder identified. Initially, through a posterior arthroscopic portal, the arthroscope with a pump attached was placed into an anterior portal and arthroscopic probe was placed.  On initial inspection, the articular cartilage in the glenohumeral joint was intact.  She had partial tearing of the anterior and superior labrum of 25% which was debrided.  The anterior-inferior labrum and anterior-inferior glenohumeral ligament complex was intact. Biceps tendon anchor and biceps tendon was intact.  The rotator cuff showed a complete tear of the supraspinatus.  The subscap showed a partial tear 25-30%, which was debrided.  The rest of the rotator cuff was intact.  Inferior capsular recess was free of pathology. Subacromial space was entered and a lateral arthroscopic portal was made.  Moderately thickened bursitis was resected.  Impingement was noted and a subacromial decompression was carried out removing 6-8 mm of the undersurface of the anterior, anterolateral, and anteromedial acromion and CA ligament release carried out as well.  The [facility] joint was not pathologic and thus was not resected.  At this point, the rotator cuff tear was repaired through an accessory lateral portal.  One Arthrex swivel lock anchor and fiber tape was placed in a mattress suture technique and then the anchor was deployed on  the lateral aspect of the greater tuberosity with firm and tight fixation.  This completely repaired the tear back down to its anatomic position with excellent fixation.  The shoulder could be brought through full range of motion with no impingement on the repair after this was done.  At this point, she felt that all pathology had been satisfactorily addressed.  The instruments were removed.  Portals closed with 3-0 nylon suture. Sterile dressings were applied in the sling and the patient awakened and taken to recovery room in stable condition.  FOLLOWUP  CARE:  Cheryl Chan will be followed as an outpatient on OxyIR and Valium with early physical therapy.  She will be seen back in office in a week for sutures out and followup.     Chukwuma Straus A. Noemi Chapel, M.D.     RAW/MEDQ  D:  11/27/2013  T:  11/28/2013  Job:  [id number]

## 2013-11-30 ENCOUNTER — Other Ambulatory Visit: Payer: Self-pay | Admitting: Internal Medicine

## 2013-12-01 ENCOUNTER — Encounter (HOSPITAL_BASED_OUTPATIENT_CLINIC_OR_DEPARTMENT_OTHER): Payer: Self-pay | Admitting: Orthopedic Surgery

## 2013-12-01 ENCOUNTER — Other Ambulatory Visit: Payer: Self-pay | Admitting: *Deleted

## 2013-12-01 MED ORDER — ARMOUR THYROID 90 MG PO TABS: ORAL_TABLET | ORAL | Status: DC

## 2014-02-10 ENCOUNTER — Encounter: Payer: Self-pay | Admitting: Obstetrics & Gynecology

## 2014-02-10 ENCOUNTER — Ambulatory Visit: Payer: 59 | Admitting: Obstetrics & Gynecology

## 2014-02-10 ENCOUNTER — Ambulatory Visit (INDEPENDENT_AMBULATORY_CARE_PROVIDER_SITE_OTHER): Payer: 59 | Admitting: Obstetrics & Gynecology

## 2014-02-10 VITALS — BP 120/76 | HR 72 | Resp 12 | Ht 63.25 in | Wt 160.0 lb

## 2014-02-10 DIAGNOSIS — Z01419 Encounter for gynecological examination (general) (routine) without abnormal findings: Secondary | ICD-10-CM

## 2014-02-10 DIAGNOSIS — Z124 Encounter for screening for malignant neoplasm of cervix: Secondary | ICD-10-CM

## 2014-02-10 MED ORDER — ESTRADIOL 0.1 MG/GM VA CREA: TOPICAL_CREAM | VAGINAL | Status: DC

## 2014-02-10 NOTE — Progress Notes (Signed)
58 y.o. G5P4 Married CaucasianF here for annual exam.  Doing well.  No vaginal bleeding.  Had rotator cuff surgery this year.  Done with PT.    Patient's last menstrual period was 05/22/2003.          Sexually active: No.  The current method of family planning is status post hysterectomy.    Exercising: Yes.    Walking 2x/wk Smoker:  no  Health Maintenance: Pap:  11/13/13 Negative History of abnormal Pap:  Yes; 7/13 Grade 1A endometrial adeno cancer MMG:  07/29/13 Bi-Rads 1 Colonoscopy:  8 years ago- Normal f/u in 10 years BMD:  07/28/13, -0.6/-1.0 TDaP:  08/03/2012 Screening Labs: No done by Gwendolyn Grant   reports that she has never smoked. She has never used smokeless tobacco. She reports that she drinks about .5 - 1 ounces of alcohol per week. She reports that she does not use illicit drugs.  Past Medical History  Diagnosis Date  . Hypothyroidism   . Hyperlipidemia   . Arthritis   . 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 History of chicken pox   . 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 Endometrial cancer 10/2011 dx    s/p vag hyst 11/2011  . Diet-controlled type 2 diabetes mellitus   . Complex endometrial hyperplasia 2005  . Anxiety   . Rotator cuff injury     Past Surgical History  Procedure Laterality Date  . Cesarean section      x's 4  . Fracture surgery      nose as a child  . Dilation and curettage of uterus      hysteroscopy  . Robotic assisted lap vaginal hysterectomy  12/04/2011    Procedure: ROBOTIC ASSISTED LAPAROSCOPIC VAGINAL HYSTERECTOMY;  Surgeon: Imagene Gurney A. Alycia Rossetti, MD;  Location: [facility];  Service: Gynecology;  Laterality: N/A;  . Open reduction internal fixation (orif) distal phalanx Left 08/05/2012    Procedure: OPEN REDUCTION INTERNAL FIXATION (ORIF) DISTAL PHALANX VERSUS EXTERNAL FIXATION LEFT RING FINGER;  Surgeon: Tennis Must, MD;  Location: [facility];  Service: Orthopedics;  Laterality: Left;  . Abdominal hysterectomy     . Shoulder arthroscopy with rotator cuff repair and subacromial decompression Left 11/27/2013    Procedure: LEFT SHOULDER ARTHROSCOPY, SUBACROMIAL DECOMPRESSION, PARTIAL ACROMIOPLASTY WITH  CORACOACROMIAL RELEASE, DISTAL CLAVICULECTOMY WITH ROTATOR CUFF REPAIR AND EXTENSIVE DEBRIDEMENT;  Surgeon: Lorn Junes, MD;  Location: [facility];  Service: Orthopedics;  Laterality: Left;    Current Outpatient Prescriptions  Medication Sig Dispense Refill  . acetaminophen (TYLENOL) 500 MG tablet Take 1,000 mg by mouth every 6 (six) hours as needed.      Francia Greaves THYROID 90 MG tablet TAKE 1 TABLET BY MOUTH DAILY.  90 tablet  2  . fluticasone (FLONASE) 50 MCG/ACT nasal spray INHALE 2 SPRAYS INTO THE NOSE DAILY AS NEEDED.  48 g  0  . sertraline (ZOLOFT) 100 MG tablet TAKE 1 TABLET BY MOUTH ONCE DAILY  90 tablet  1   No current facility-administered medications for this visit.    Family History  Problem Relation Age of Onset  . Heart disease Brother 49    cardiomyopathy s/p ICD, on transplant list  . Cancer Father 5    prostrate  . Hypertension Father   . Arthritis Mother     ROS:  Pertinent items are noted in HPI.  Otherwise, a comprehensive ROS was negative.  Exam:   BP  120/76  Resp 12  Ht 5' 3.25" (1.607 m)  Wt 160 lb (72.576 kg)  BMI 28.10 kg/m2  LMP 05/22/2003  Weight change: +7#   Height: 5' 3.25" (160.7 cm)  Ht Readings from Last 3 Encounters:  02/10/14 5' 3.25" (1.607 m)  11/27/13 5\' 2"  (1.575 m)  11/27/13 5\' 2"  (1.575 m)    General appearance: alert, cooperative and appears stated age Head: Normocephalic, without obvious abnormality, atraumatic Neck: no adenopathy, supple, symmetrical, trachea midline and thyroid normal to inspection and palpation Lungs: clear to auscultation bilaterally Breasts: normal appearance, no masses or tenderness Heart: regular rate and rhythm Abdomen: soft, non-tender; bowel sounds normal; no masses,  no organomegaly Extremities:  extremities normal, atraumatic, no cyanosis or edema Skin: Skin color, texture, turgor normal. No rashes or lesions Lymph nodes: Cervical, supraclavicular, and axillary nodes normal. No abnormal inguinal nodes palpated Neurologic: Grossly normal   Pelvic: External genitalia:  no lesions              Urethra:  normal appearing urethra with no masses, tenderness or lesions              Bartholins and Skenes: normal                 Vagina: normal appearing vagina with normal color and discharge, no lesions, atrophy              Cervix: absent              Pap taken: Yes.   Bimanual Exam:  Uterus:  uterus absent              Adnexa: no mass, fullness, tenderness               Rectovaginal: Confirms               Anus:  normal sphincter tone, no lesions  A:  Well Woman with normal exam  PMP, No HRT  Vaginal atrophic changes H/o endometrial cancer, s/p TLH/BSO 7/13 Hypothyroidism   P: Mammogram yearly  pap smear obtained today  Labs with Dr. Asa Lente  Estrace vaginal cream 1 gram pv twice weekly.  #42.5gm/2RF return annually or prn  An After Visit Summary was printed and given to the patient.

## 2014-02-11 LAB — IPS PAP TEST WITH REFLEX TO HPV

## 2014-02-16 ENCOUNTER — Ambulatory Visit: Payer: 59 | Admitting: Obstetrics & Gynecology

## 2014-03-05 ENCOUNTER — Other Ambulatory Visit: Payer: Self-pay | Admitting: Internal Medicine

## 2014-03-22 ENCOUNTER — Encounter: Payer: Self-pay | Admitting: Obstetrics & Gynecology

## 2014-03-31 ENCOUNTER — Other Ambulatory Visit (INDEPENDENT_AMBULATORY_CARE_PROVIDER_SITE_OTHER): Payer: 59

## 2014-03-31 ENCOUNTER — Ambulatory Visit (INDEPENDENT_AMBULATORY_CARE_PROVIDER_SITE_OTHER): Payer: 59 | Admitting: Internal Medicine

## 2014-03-31 ENCOUNTER — Encounter: Payer: Self-pay | Admitting: Internal Medicine

## 2014-03-31 VITALS — BP 122/80 | HR 78 | Temp 97.8°F | Ht 63.25 in | Wt 163.0 lb

## 2014-03-31 DIAGNOSIS — E039 Hypothyroidism, unspecified: Secondary | ICD-10-CM

## 2014-03-31 DIAGNOSIS — E119 Type 2 diabetes mellitus without complications: Secondary | ICD-10-CM

## 2014-03-31 DIAGNOSIS — Z23 Encounter for immunization: Secondary | ICD-10-CM

## 2014-03-31 LAB — HEMOGLOBIN A1C: HEMOGLOBIN A1C: 6.9 % — AB (ref 4.6–6.5)

## 2014-03-31 LAB — TSH: TSH: 1.89 u[IU]/mL (ref 0.35–4.50)

## 2014-03-31 NOTE — Assessment & Plan Note (Signed)
Prev on Armour - changed to levothyroxine because of cost Check TSH q 57mo and adjust dose if needed The current medical regimen is effective;  continue present plan and medications. Lab Results  Component Value Date   TSH 2.93 09/24/2013

## 2014-03-31 NOTE — Progress Notes (Signed)
Pre visit review using our clinic review tool, if applicable. No additional management support is needed unless otherwise documented below in the visit note. 

## 2014-03-31 NOTE — Assessment & Plan Note (Addendum)
Hx gestational DM requiring insulin then on metformin - none since 2010 Check a1c q 73mo to monitor If >7, will start Victoza - discussed same today Encouraged continued weight control, diet and exercsie Lab Results  Component Value Date   HGBA1C 6.4 09/24/2013

## 2014-03-31 NOTE — Progress Notes (Signed)
Subjective:    Patient ID: Cheryl Chan, female    DOB: 08-25-1955, 58 y.o.   MRN: [id number]  HPI  Patient is here for follow up  Reviewed chronic medical issues and interval medical events  Past Medical History  Diagnosis Date  . Hypothyroidism   . Hyperlipidemia   . Arthritis   . 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 History of chicken pox   . 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 Endometrial cancer 10/2011 dx    s/p vag hyst 11/2011  . Diet-controlled type 2 diabetes mellitus   . Complex endometrial hyperplasia 2005  . Anxiety   . Rotator cuff injury     Review of Systems  Constitutional: Positive for unexpected weight change. Negative for fever.  Respiratory: Negative for cough and shortness of breath.   Cardiovascular: Negative for chest pain and leg swelling.       Objective:   Physical Exam  BP 122/80 mmHg  Pulse 78  Temp(Src) 97.8 F (36.6 C) (Oral)  Ht 5' 3.25" (1.607 m)  Wt 163 lb (73.936 kg)  BMI 28.63 kg/m2  SpO2 97%  LMP 05/22/2003 Wt Readings from Last 3 Encounters:  03/31/14 163 lb (73.936 kg)  02/10/14 160 lb (72.576 kg)  11/27/13 149 lb (67.586 kg)   Constitutional: She is overweight, appears well-developed and well-nourished. No distress.  Neck: Normal range of motion. Neck supple. No JVD present. No thyromegaly present.  Cardiovascular: Normal rate, regular rhythm and normal heart sounds.  No murmur heard. No BLE edema. Pulmonary/Chest: Effort normal and breath sounds normal. No respiratory distress. She has no wheezes.  Psychiatric: She has a normal mood and affect. Her behavior is normal. Judgment and thought content normal.   Lab Results  Component Value Date   WBC 4.0 09/24/2013   HGB 11.4* 11/27/2013   HCT 38.6 09/24/2013   PLT 236.0 09/24/2013   GLUCOSE 122* 09/24/2013   CHOL 220* 09/24/2013   TRIG 60.0 09/24/2013   HDL 56.70 09/24/2013   LDLDIRECT 148.7 09/23/2012   LDLCALC 151* 09/24/2013   ALT 18 09/24/2013    AST 18 09/24/2013   NA 139 09/24/2013   K 4.0 09/24/2013   CL 104 09/24/2013   CREATININE 0.6 09/24/2013   BUN 16 09/24/2013   CO2 29 09/24/2013   TSH 2.93 09/24/2013   HGBA1C 6.4 09/24/2013   MICROALBUR 0.1 09/24/2013    No results found.     Assessment & Plan:   Problem List Items Addressed This Visit    Diet-controlled type 2 diabetes mellitus - Primary    Hx gestational DM requiring insulin then on metformin - none since 2010 Check a1c q 35mo to monitor If >7, will start Victoza - discussed same today Encouraged continued weight control, diet and exercsie Lab Results  Component Value Date   HGBA1C 6.4 09/24/2013      Hypothyroidism    Prev on Armour - changed to levothyroxine because of cost Check TSH q 67mo and adjust dose if needed The current medical regimen is effective;  continue present plan and medications. Lab Results  Component Value Date   TSH 2.93 09/24/2013      Relevant Medications      levothyroxine (SYNTHROID, LEVOTHROID) 150 MCG tablet    Other Visit Diagnoses    Need for prophylactic vaccination and inoculation against influenza        Relevant Orders  Flu Vaccine QUAD 36+ mos PF IM (Fluarix Quad PF) (Completed)

## 2014-03-31 NOTE — Patient Instructions (Signed)
It was good to see you today.  We have reviewed your prior records including labs and tests today  Test(s) ordered today. Your results will be released to [facility] (or called to you) after review, usually within 72hours after test completion. If any changes need to be made, you will be notified at that same time.  Medications reviewed and updated, no changes recommended at this time. Will consider Victoza if a1c >7 - or metformin if needed  Work on lifestyle changes as discussed (low fat, low carb, increased protein diet; improved exercise efforts; weight loss) to control sugar, blood pressure and cholesterol levels and/or reduce risk of developing other medical problems. Look into http://vang.com/ or other type of food journal to assist you in this process.  Please schedule followup in 6 months, call sooner if problems.

## 2014-04-01 ENCOUNTER — Encounter: Payer: Self-pay | Admitting: Internal Medicine

## 2014-04-06 MED ORDER — EXENATIDE ER 2 MG ~~LOC~~ PEN: 2.0000 mg | PEN_INJECTOR | SUBCUTANEOUS | Status: DC

## 2014-05-13 ENCOUNTER — Other Ambulatory Visit: Payer: Self-pay | Admitting: Internal Medicine

## 2014-05-19 LAB — HM DIABETES EYE EXAM

## 2014-05-31 ENCOUNTER — Other Ambulatory Visit: Payer: Self-pay | Admitting: Internal Medicine

## 2014-06-16 ENCOUNTER — Other Ambulatory Visit: Payer: Self-pay | Admitting: Internal Medicine

## 2014-06-23 ENCOUNTER — Encounter: Payer: Self-pay | Admitting: Internal Medicine

## 2014-07-20 ENCOUNTER — Other Ambulatory Visit: Payer: Self-pay

## 2014-07-20 ENCOUNTER — Telehealth: Payer: Self-pay | Admitting: *Deleted

## 2014-07-20 DIAGNOSIS — Z1231 Encounter for screening mammogram for malignant neoplasm of breast: Secondary | ICD-10-CM

## 2014-07-20 NOTE — Telephone Encounter (Signed)
Patient called to schedule appt with Dr. Alycia Rossetti. Patient given appt for 08/26/14 at 9:15am - patient agreeable to appt date and time.

## 2014-08-03 ENCOUNTER — Ambulatory Visit
Admission: RE | Admit: 2014-08-03 | Discharge: 2014-08-03 | Disposition: A | Discharge status: Home / Self Care | Payer: 59 | Source: Ambulatory Visit

## 2014-08-03 DIAGNOSIS — Z1231 Encounter for screening mammogram for malignant neoplasm of breast: Secondary | ICD-10-CM

## 2014-08-25 ENCOUNTER — Telehealth: Payer: Self-pay | Admitting: Internal Medicine

## 2014-08-25 DIAGNOSIS — Z0000 Encounter for general adult medical examination without abnormal findings: Secondary | ICD-10-CM

## 2014-08-25 DIAGNOSIS — E119 Type 2 diabetes mellitus without complications: Secondary | ICD-10-CM

## 2014-08-25 NOTE — Telephone Encounter (Signed)
Ok - I have done same - please let her know same thanks

## 2014-08-25 NOTE — Telephone Encounter (Signed)
Patient is requesting to have lab orders entered into the system a week before her appointment on 9/6 with Dr. Asa Lente.

## 2014-08-26 ENCOUNTER — Encounter: Payer: Self-pay | Admitting: Gynecologic Oncology

## 2014-08-26 ENCOUNTER — Ambulatory Visit: Payer: 59 | Attending: Gynecologic Oncology | Admitting: Gynecologic Oncology

## 2014-08-26 VITALS — BP 124/67 | HR 72 | Temp 97.9°F | Resp 18 | Ht 63.25 in | Wt 165.2 lb

## 2014-08-26 DIAGNOSIS — C541 Malignant neoplasm of endometrium: Secondary | ICD-10-CM | POA: Insufficient documentation

## 2014-08-26 DIAGNOSIS — Z79899 Other long term (current) drug therapy: Secondary | ICD-10-CM | POA: Diagnosis not present

## 2014-08-26 DIAGNOSIS — F419 Anxiety disorder, unspecified: Secondary | ICD-10-CM | POA: Insufficient documentation

## 2014-08-26 DIAGNOSIS — F329 Major depressive disorder, single episode, unspecified: Secondary | ICD-10-CM | POA: Diagnosis not present

## 2014-08-26 DIAGNOSIS — M1990 Unspecified osteoarthritis, unspecified site: Secondary | ICD-10-CM | POA: Insufficient documentation

## 2014-08-26 DIAGNOSIS — E039 Hypothyroidism, unspecified: Secondary | ICD-10-CM | POA: Diagnosis not present

## 2014-08-26 DIAGNOSIS — Z8542 Personal history of malignant neoplasm of other parts of uterus: Secondary | ICD-10-CM

## 2014-08-26 DIAGNOSIS — Z90710 Acquired absence of both cervix and uterus: Secondary | ICD-10-CM | POA: Insufficient documentation

## 2014-08-26 DIAGNOSIS — E785 Hyperlipidemia, unspecified: Secondary | ICD-10-CM | POA: Insufficient documentation

## 2014-08-26 DIAGNOSIS — E119 Type 2 diabetes mellitus without complications: Secondary | ICD-10-CM | POA: Diagnosis not present

## 2014-08-26 NOTE — Patient Instructions (Signed)
Return to see Gyn Oncology in one year and Dr. Sabra Heck in 6 months.

## 2014-08-26 NOTE — Progress Notes (Signed)
Consult Note: Gyn-Onc  Cheryl Chan 59 y.o. female  CC:  Chief Complaint  Patient presents with  . endometrial cancer    HPI: Patient is a 59 year old who was initially seen by Dr. Colonel Bald at [facility]. She was referred by Dr. Edwinna Areola for endometrial carcinoma.   She had experienced a gush of fluid was seen by her gynecologist. Endometrial biopsy revealed a grade 1 endometrial cancer. Her prior history was menopause approximately 5 years prior. After discussion she opted for surgical management. On July 16 of year 2013 she underwent a total robotic hysterectomy bilateral salpingo-oophorectomy and right pelvic lymph node dissection. The operative findings included normal uterus cervix and adnexa. There is adhesive disease of the bladder to the anterior uterus. Frozen section revealed a grade 1 endometrial adenocarcinoma with minimal to no myometrial invasion.   Final pathology revealed a grade 1 endometrioid adenocarcinoma that extended 0.4 cm with the myometrium was 1.2 cm in thickness. There was no lymphovascular space involvement. The maximum tumor size was 1.5 cm it was grade 1 lesion. The adnexa were negative and 0/5 right pelvic lymph nodes were involved. I last saw her in March of 2015 and she was last seen by Dr. Sabra Heck in the interim. At that time and exam and Pap smear were both done. She comes in today for followup.   Interval History:  She is overall doing quite well. She is up-to-date on her mammogram (2-3 weeks ago). She's due for colonoscopy in 4 years. There are no new medical problems and her family. Her son and his family have moved here from [place]. She is very much enjoying seeing her grandson Marland Kitchen born 5/15) every day.  Review of Systems: She denies any chest pain, shortness of breath, nausea, vomiting, chest pain, shortness of breath. She has not noticed any bleeding or change in her bowel or bladder habits. She has a slight cough and is not sure if it is from allergies  or a cold. 10 point review of systems is otherwise negative.  Current Meds:  Outpatient Encounter Prescriptions as of 08/26/2014  Medication Sig  . acetaminophen (TYLENOL) 500 MG tablet Take 1,000 mg by mouth every 6 (six) hours as needed.  . diclofenac (VOLTAREN) 75 MG EC tablet Take 75 mg by mouth 2 (two) times daily.  . fluticasone (FLONASE) 50 MCG/ACT nasal spray INHALE 2 SPRAYS INTO THE NOSE DAILY AS NEEDED.  Marland Kitchen levothyroxine (SYNTHROID, LEVOTHROID) 150 MCG tablet TAKE 1 TABLET BY MOUTH ONCE DAILY  . sertraline (ZOLOFT) 100 MG tablet Take 1 tablet (100 mg total) by mouth daily.  Marland Kitchen estradiol (ESTRACE) 0.1 MG/GM vaginal cream 1 gram vaginally twice weekly (Patient not taking: Reported on 08/26/2014)  . Exenatide ER 2 MG PEN Inject 2 mg into the skin once a week. (Patient not taking: Reported on 08/26/2014)    Allergy:  Allergies  Allergen Reactions  . Crestor [Rosuvastatin Calcium]     Muscle ache  . Demerol [Meperidine]   . Statins     Social Hx:   History   Social History  . Marital Status: Married    Spouse Name: N/A  . Number of Children: N/A  . Years of Education: N/A   Occupational History  . Not on file.   Social History Main Topics  . Smoking status: Never Smoker   . Smokeless tobacco: Never Used  . Alcohol Use: 0.5 - 1.0 oz/week    1-2 drink(s) per week     Comment: social  .  Drug Use: No  . Sexual Activity: No     Comment: hysterectomy   Other Topics Concern  . 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 Cesarean section      x's 4  . Fracture surgery      nose as a child  . Dilation and curettage of uterus      hysteroscopy  . Robotic assisted lap vaginal hysterectomy  12/04/2011    Procedure: ROBOTIC ASSISTED LAPAROSCOPIC VAGINAL HYSTERECTOMY;  Surgeon: Imagene Gurney A. Alycia Rossetti, MD;   Location: [facility];  Service: Gynecology;  Laterality: N/A;  . Open reduction internal fixation (orif) distal phalanx Left 08/05/2012    Procedure: OPEN REDUCTION INTERNAL FIXATION (ORIF) DISTAL PHALANX VERSUS EXTERNAL FIXATION LEFT RING FINGER;  Surgeon: Tennis Must, MD;  Location: [place];  Service: Orthopedics;  Laterality: Left;  . Abdominal hysterectomy    . Shoulder arthroscopy with rotator cuff repair and subacromial decompression Left 11/27/2013    Procedure: LEFT SHOULDER ARTHROSCOPY, SUBACROMIAL DECOMPRESSION, PARTIAL ACROMIOPLASTY WITH  CORACOACROMIAL RELEASE, DISTAL CLAVICULECTOMY WITH ROTATOR CUFF REPAIR AND EXTENSIVE DEBRIDEMENT;  Surgeon: Lorn Junes, MD;  Location: [place];  Service: Orthopedics;  Laterality: Left;    Past Medical Hx:  Past Medical History  Diagnosis Date  . Hypothyroidism   . Hyperlipidemia   . Arthritis   . 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 History of chicken pox   . 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 Endometrial cancer 10/2011 dx    s/p vag hyst 11/2011  . Diet-controlled type 2 diabetes mellitus   . Complex endometrial hyperplasia 2005  . Anxiety   . Rotator cuff injury     Family Hx:  Family History  Problem Relation Age of Onset  . Heart disease Brother 38    cardiomyopathy s/p ICD, on transplant list  . Cancer Father 53    prostrate  . Hypertension Father   . Arthritis Mother     Vitals:  Blood pressure 124/67, pulse 72, temperature 97.9 F (36.6 C), temperature source Oral, resp. rate 18, height 5' 3.25" (1.607 m), weight 165 lb 3.2 oz (74.934 kg), last menstrual period 05/22/2003.  Physical Exam: Neck is supple, there is no lymphadenopathy no thyromegaly  Lungs: Clear to auscultation bilaterally.  Cardiovascular: Regular rate rhythm.  Abdomen: Well-healed surgical incisions. There is no evidence of any incisional hernias. There are no masses or hepatosplenomegaly. Abdomen  is nontender.  Groins: No lymphadenopathy.  Extremities: No edema.  Pelvic: Normal external female genitalia. Vagina slightly atrophic. There are no vaginal cuff lesions. Bimanual examination reveals no masses or nodularity. Rectal confirms.  Assessment/Plan: 59 year old diagnosed with a stage IA grade 1 endometrial adenocarcinoma status post definitive surgery in July 2013. She has no evidence of recurrent disease. She will followup with Dr. Sabra Heck in 6 months for her annual examination of the Pap smear once a year from the time of the anniversary of her diagnosis. She'll followup with [place] in 1 year. She knows to contact us if she has been issues with bleeding pelvic or abdominal pain.  Laxmi Choung A., MD 08/26/2014, 9:54 AM

## 2014-08-26 NOTE — Telephone Encounter (Signed)
LVM for pt to call back.  RE: MD response below.

## 2014-09-17 ENCOUNTER — Other Ambulatory Visit: Payer: Self-pay | Admitting: Internal Medicine

## 2014-09-20 ENCOUNTER — Other Ambulatory Visit: Payer: Self-pay

## 2014-09-20 MED ORDER — FLUTICASONE PROPIONATE 50 MCG/ACT NA SUSP: NASAL | Status: DC

## 2014-09-20 NOTE — Telephone Encounter (Signed)
Left message advising patient that med has been called in to pharm

## 2014-09-29 ENCOUNTER — Ambulatory Visit: Payer: 59 | Admitting: Internal Medicine

## 2014-11-15 ENCOUNTER — Other Ambulatory Visit: Payer: Self-pay

## 2014-12-24 ENCOUNTER — Other Ambulatory Visit: Payer: Self-pay | Admitting: Internal Medicine

## 2015-01-25 ENCOUNTER — Other Ambulatory Visit (INDEPENDENT_AMBULATORY_CARE_PROVIDER_SITE_OTHER): Payer: 59

## 2015-01-25 ENCOUNTER — Encounter: Payer: Self-pay | Admitting: Internal Medicine

## 2015-01-25 ENCOUNTER — Ambulatory Visit (INDEPENDENT_AMBULATORY_CARE_PROVIDER_SITE_OTHER): Payer: 59 | Admitting: Internal Medicine

## 2015-01-25 VITALS — BP 124/80 | HR 72 | Temp 98.0°F | Ht 63.25 in | Wt 167.0 lb

## 2015-01-25 DIAGNOSIS — Z1159 Encounter for screening for other viral diseases: Secondary | ICD-10-CM

## 2015-01-25 DIAGNOSIS — Z114 Encounter for screening for human immunodeficiency virus [HIV]: Secondary | ICD-10-CM | POA: Diagnosis not present

## 2015-01-25 DIAGNOSIS — E039 Hypothyroidism, unspecified: Secondary | ICD-10-CM

## 2015-01-25 DIAGNOSIS — Z0000 Encounter for general adult medical examination without abnormal findings: Secondary | ICD-10-CM

## 2015-01-25 DIAGNOSIS — E119 Type 2 diabetes mellitus without complications: Secondary | ICD-10-CM

## 2015-01-25 DIAGNOSIS — Z23 Encounter for immunization: Secondary | ICD-10-CM

## 2015-01-25 DIAGNOSIS — E663 Overweight: Secondary | ICD-10-CM | POA: Diagnosis not present

## 2015-01-25 DIAGNOSIS — R202 Paresthesia of skin: Secondary | ICD-10-CM

## 2015-01-25 LAB — URINALYSIS, ROUTINE W REFLEX MICROSCOPIC
Bilirubin Urine: NEGATIVE
HGB URINE DIPSTICK: NEGATIVE
Ketones, ur: NEGATIVE
LEUKOCYTES UA: NEGATIVE
Nitrite: NEGATIVE
RBC / HPF: NONE SEEN (ref 0–?)
Specific Gravity, Urine: 1.025 (ref 1.000–1.030)
Total Protein, Urine: NEGATIVE
Urine Glucose: NEGATIVE
Urobilinogen, UA: 0.2 (ref 0.0–1.0)
pH: 6.5 (ref 5.0–8.0)

## 2015-01-25 LAB — CBC WITH DIFFERENTIAL/PLATELET
Basophils Absolute: 0 10*3/uL (ref 0.0–0.1)
Basophils Relative: 0.5 % (ref 0.0–3.0)
EOS ABS: 0.1 10*3/uL (ref 0.0–0.7)
Eosinophils Relative: 2.4 % (ref 0.0–5.0)
HCT: 37.5 % (ref 36.0–46.0)
Hemoglobin: 12.6 g/dL (ref 12.0–15.0)
LYMPHS ABS: 1.3 10*3/uL (ref 0.7–4.0)
Lymphocytes Relative: 30.7 % (ref 12.0–46.0)
MCHC: 33.6 g/dL (ref 30.0–36.0)
MCV: 87.4 fl (ref 78.0–100.0)
MONOS PCT: 10 % (ref 3.0–12.0)
Monocytes Absolute: 0.4 10*3/uL (ref 0.1–1.0)
NEUTROS ABS: 2.5 10*3/uL (ref 1.4–7.7)
NEUTROS PCT: 56.4 % (ref 43.0–77.0)
PLATELETS: 272 10*3/uL (ref 150.0–400.0)
RBC: 4.29 Mil/uL (ref 3.87–5.11)
RDW: 14.9 % (ref 11.5–15.5)
WBC: 4.4 10*3/uL (ref 4.0–10.5)

## 2015-01-25 LAB — MICROALBUMIN / CREATININE URINE RATIO
Creatinine,U: 146.7 mg/dL
MICROALB UR: 0.7 mg/dL (ref 0.0–1.9)
Microalb Creat Ratio: 0.5 mg/g (ref 0.0–30.0)

## 2015-01-25 LAB — HEPATIC FUNCTION PANEL
ALBUMIN: 4.1 g/dL (ref 3.5–5.2)
ALT: 19 U/L (ref 0–35)
AST: 18 U/L (ref 0–37)
Alkaline Phosphatase: 75 U/L (ref 39–117)
Bilirubin, Direct: 0.1 mg/dL (ref 0.0–0.3)
Total Bilirubin: 0.8 mg/dL (ref 0.2–1.2)
Total Protein: 7.1 g/dL (ref 6.0–8.3)

## 2015-01-25 LAB — BASIC METABOLIC PANEL
BUN: 23 mg/dL (ref 6–23)
CALCIUM: 8.9 mg/dL (ref 8.4–10.5)
CO2: 30 meq/L (ref 19–32)
CREATININE: 0.55 mg/dL (ref 0.40–1.20)
Chloride: 105 mEq/L (ref 96–112)
GFR: 120.37 mL/min (ref >60.00)
GLUCOSE: 148 mg/dL — AB (ref 70–99)
Potassium: 4.3 mEq/L (ref 3.5–5.1)
Sodium: 141 mEq/L (ref 135–145)

## 2015-01-25 LAB — HIV ANTIBODY (ROUTINE TESTING W REFLEX): HIV 1&2 Ab, 4th Generation: NONREACTIVE

## 2015-01-25 LAB — HEMOGLOBIN A1C: HEMOGLOBIN A1C: 7.4 % — AB (ref 4.6–6.5)

## 2015-01-25 LAB — LIPID PANEL
Cholesterol: 288 mg/dL — ABNORMAL HIGH (ref 0–200)
HDL: 55.8 mg/dL (ref >39.00)
LDL Cholesterol: 211 mg/dL — ABNORMAL HIGH (ref 0–99)
NONHDL: 232.3
TRIGLYCERIDES: 109 mg/dL (ref 0.0–149.0)
Total CHOL/HDL Ratio: 5
VLDL: 21.8 mg/dL (ref 0.0–40.0)

## 2015-01-25 LAB — TSH: TSH: 0.47 u[IU]/mL (ref 0.35–4.50)

## 2015-01-25 LAB — VITAMIN B12: Vitamin B-12: 415 pg/mL (ref 211–911)

## 2015-01-25 MED ORDER — FLUTICASONE PROPIONATE 50 MCG/ACT NA SUSP: 2.0000 | Freq: Every day | NASAL | Status: DC

## 2015-01-25 MED ORDER — LIRAGLUTIDE 18 MG/3ML ~~LOC~~ SOPN: 0.6000 mg | PEN_INJECTOR | Freq: Every day | SUBCUTANEOUS | Status: DC

## 2015-01-25 NOTE — Assessment & Plan Note (Signed)
Prev on Armour - changed to levothyroxine because of cost Check TSH q 43mo and adjust dose if needed The current medical regimen is effective;  continue present plan and medications. Lab Results  Component Value Date   TSH 1.89 03/31/2014

## 2015-01-25 NOTE — Progress Notes (Signed)
Pre visit review using our clinic review tool, if applicable. No additional management support is needed unless otherwise documented below in the visit note. 

## 2015-01-25 NOTE — Assessment & Plan Note (Signed)
Hx gestational DM requiring insulin then on metformin - none since 2010 Check a1c q 52mo to monitor If >7, will consider Victoza or other GLP-1 aginist type tx- discussed same today Encouraged continued weight control, diet and exercsie Lab Results  Component Value Date   HGBA1C 6.9* 03/31/2014

## 2015-01-25 NOTE — Assessment & Plan Note (Signed)
Wt Readings from Last 3 Encounters:  01/25/15 167 lb (75.751 kg)  08/26/14 165 lb 3.2 oz (74.934 kg)  03/31/14 163 lb (73.936 kg)   Body mass index is 29.33 kg/(m^2).  Weight trend reviewed. The patient is asked to make an attempt to improve diet and exercise patterns to aid in medical management of this problem. Plans for GLP-1 as above for DM if increasing a1c - may assist with weight loss goals

## 2015-01-25 NOTE — Progress Notes (Signed)
Subjective:    Patient ID: Cheryl Chan, female    DOB: 1956/03/17, 59 y.o.   MRN: [id number]  HPI  patient is here today for annual physical. Patient feels well. Also reviewed chronic medical conditions, interval events and current concerns  Past Medical History  Diagnosis Date  . Hypothyroidism   . Hyperlipidemia   . Arthritis   . 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 History of chicken pox   . 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 Endometrial cancer 10/2011 dx    s/p vag hyst 11/2011  . Diet-controlled type 2 diabetes mellitus   . Complex endometrial hyperplasia 2005  . Anxiety   . Rotator cuff injury    Family History  Problem Relation Age of Onset  . Heart disease Brother 23    cardiomyopathy s/p ICD, on transplant list  . Cancer Father 70    prostrate  . Hypertension Father   . Arthritis Mother   . Stroke Father     in setting of sepsis f/ [facility]   Social History  Substance Use Topics  . Smoking status: Never Smoker   . Smokeless tobacco: Never Used  . Alcohol Use: 0.5 - 1.0 oz/week    1-2 drink(s) per week     Comment: social   Review of Systems  Constitutional: Negative for fatigue and unexpected weight change (weight gain).  Respiratory: Negative for cough, shortness of breath and wheezing.   Cardiovascular: Negative for chest pain, palpitations and leg swelling.  Gastrointestinal: Negative for nausea, abdominal pain and diarrhea.  Neurological: Positive for numbness (B feet each AM waking, chornic - lasts minutes, no pain - or recent change). Negative for dizziness, weakness, light-headedness and headaches.  Psychiatric/Behavioral: Negative for dysphoric mood. The patient is not nervous/anxious.   All other systems reviewed and are negative.      Objective:    Physical Exam  Constitutional: She is oriented to person, place, and time. She appears well-developed and well-nourished. No distress.  obese  HENT:  Head: Normocephalic and  atraumatic.  Right Ear: External ear normal.  Left Ear: External ear normal.  Nose: Nose normal.  Mouth/Throat: Oropharynx is clear and moist. No oropharyngeal exudate.  Eyes: EOM are normal. Pupils are equal, round, and reactive to light. Right eye exhibits no discharge. Left eye exhibits no discharge. No scleral icterus.  Neck: Normal range of motion. Neck supple. No JVD present. No tracheal deviation present. No thyromegaly present.  Cardiovascular: Normal rate, regular rhythm, normal heart sounds and intact distal pulses.  Exam reveals no friction rub.   No murmur heard. Pulmonary/Chest: Effort normal and breath sounds normal. No respiratory distress. She has no wheezes. She has no rales. She exhibits no tenderness.  Abdominal: Soft. Bowel sounds are normal. She exhibits no distension and no mass. There is no tenderness. There is no rebound and no guarding.  Musculoskeletal: Normal range of motion.  Lymphadenopathy:    She has no cervical adenopathy.  Neurological: She is alert and oriented to person, place, and time. She has normal reflexes. No cranial nerve deficit.  Skin: Skin is warm and dry. No rash noted. She is not diaphoretic. No erythema.  Psychiatric: She has a normal mood and affect. Her behavior is normal. Judgment and thought content normal.  Nursing note and vitals reviewed.   BP 124/80 mmHg  Pulse 72  Temp(Src) 98 F (36.7 C) (Oral)  Ht 5' 3.25" (1.607 m)  Wt 167 lb (75.751 kg)  BMI 29.33 kg/m2  SpO2 98%  LMP 05/22/2003 Wt Readings from Last 3 Encounters:  01/25/15 167 lb (75.751 kg)  08/26/14 165 lb 3.2 oz (74.934 kg)  03/31/14 163 lb (73.936 kg)    Lab Results  Component Value Date   WBC 4.0 09/24/2013   HGB 11.4* 11/27/2013   HCT 38.6 09/24/2013   PLT 236.0 09/24/2013   GLUCOSE 122* 09/24/2013   CHOL 220* 09/24/2013   TRIG 60.0 09/24/2013   HDL 56.70 09/24/2013   LDLDIRECT 148.7 09/23/2012   LDLCALC 151* 09/24/2013   ALT 18 09/24/2013   AST 18  09/24/2013   NA 139 09/24/2013   K 4.0 09/24/2013   CL 104 09/24/2013   CREATININE 0.6 09/24/2013   BUN 16 09/24/2013   CO2 29 09/24/2013   TSH 1.89 03/31/2014   HGBA1C 6.9* 03/31/2014   MICROALBUR 0.1 09/24/2013    Mm Digital Screening Bilateral  08/03/2014   CLINICAL DATA:  Screening.  EXAM: DIGITAL SCREENING BILATERAL MAMMOGRAM WITH CAD  COMPARISON:  Previous exam(s).  ACR Breast Density Category b: There are scattered areas of fibroglandular density.  FINDINGS: There are no findings suspicious for malignancy. Images were processed with CAD.  IMPRESSION: No mammographic evidence of malignancy. A result letter of this screening mammogram will be mailed directly to the patient.  RECOMMENDATION: Screening mammogram in one year. (Code:SM-B-01Y)  BI-RADS CATEGORY  1: Negative.   Electronically Signed   By: Lajean Manes M.D.   On: 08/03/2014 10:37       Assessment & Plan:   CPX/z00.00 - Patient has been counseled on age-appropriate routine health concerns for screening and prevention. These are reviewed and up-to-date. Immunizations are up-to-date or declined. Labs ordered and reviewed.  Numbness bilateral feet. In addition to checking A1c, TSH and routine labs, will also check B-12. Chronic issue suspect peripheral neuropathy. No other imaging needed at this point in time given lack of change in symptoms of metabolic rule out unremarkable  Problem List Items Addressed This Visit    Diet-controlled type 2 diabetes mellitus    Hx gestational DM requiring insulin then on metformin - none since 2010 Check a1c q 46mo to monitor If >7, will consider Victoza or other GLP-1 aginist type tx- discussed same today Encouraged continued weight control, diet and exercsie Lab Results  Component Value Date   HGBA1C 6.9* 03/31/2014        Relevant Orders   Hemoglobin A1c   Microalbumin / creatinine urine ratio   Hypothyroidism    Prev on Armour - changed to levothyroxine because of cost Check  TSH q 14mo and adjust dose if needed The current medical regimen is effective;  continue present plan and medications. Lab Results  Component Value Date   TSH 1.89 03/31/2014        Relevant Orders   TSH   Overweight    Wt Readings from Last 3 Encounters:  01/25/15 167 lb (75.751 kg)  08/26/14 165 lb 3.2 oz (74.934 kg)  03/31/14 163 lb (73.936 kg)   Body mass index is 29.33 kg/(m^2).  Weight trend reviewed. The patient is asked to make an attempt to improve diet and exercise patterns to aid in medical management of this problem. Plans for GLP-1 as above for DM if increasing a1c - may assist with weight loss goals       Other Visit Diagnoses    Routine general medical examination at a health care facility    -  Primary    Relevant Orders    Basic metabolic panel    CBC with Differential/Platelet    Hepatic function panel    Lipid panel    TSH    Urinalysis, Routine w reflex microscopic (not at [facility])    Need for prophylactic vaccination and inoculation against influenza        Relevant Orders    Flu Vaccine QUAD 36+ mos IM (Completed)    Paresthesia of both feet        Relevant Orders    Vitamin B12    Screening for HIV without presence of risk factors        Relevant Orders    HIV antibody    Need for hepatitis C screening test        Relevant Orders    Hepatitis C antibody        Gwendolyn Grant, MD

## 2015-01-25 NOTE — Patient Instructions (Addendum)
It was good to see you today.  We have reviewed your prior records including labs and tests today  Health Maintenance reviewed - all recommended immunizations and age-appropriate screenings are up-to-date.  Test(s) ordered today. Your results will be released to MyChart (or called to you) after review, usually within 72hours after test completion. If any changes need to be made, you will be notified at that same time.  Medications reviewed and updated, no changes recommended at this time.  Please schedule followup in 6 months for semiannual exam and labs, call sooner if problems.  Health Maintenance Adopting a healthy lifestyle and getting preventive care can go a long way to promote health and wellness. Talk with your health care provider about what schedule of regular examinations is right for you. This is a good chance for you to check in with your provider about disease prevention and staying healthy. In between checkups, there are plenty of things you can do on your own. Experts have done a lot of research about which lifestyle changes and preventive measures are most likely to keep you healthy. Ask your health care provider for more information. WEIGHT AND DIET  Eat a healthy diet  Be sure to include plenty of vegetables, fruits, low-fat dairy products, and lean protein.  Do not eat a lot of foods high in solid fats, added sugars, or salt.  Get regular exercise. This is one of the most important things you can do for your health.  Most adults should exercise for at least 150 minutes each week. The exercise should increase your heart rate and make you sweat (moderate-intensity exercise).  Most adults should also do strengthening exercises at least twice a week. This is in addition to the moderate-intensity exercise.  Maintain a healthy weight  Body mass index (BMI) is a measurement that can be used to identify possible weight problems. It estimates body fat based on height and  weight. Your health care provider can help determine your BMI and help you achieve or maintain a healthy weight.  For females 20 years of age and older:   A BMI below 18.5 is considered underweight.  A BMI of 18.5 to 24.9 is normal.  A BMI of 25 to 29.9 is considered overweight.  A BMI of 30 and above is considered obese.  Watch levels of cholesterol and blood lipids  You should start having your blood tested for lipids and cholesterol at 59 years of age, then have this test every 5 years.  You may need to have your cholesterol levels checked more often if:  Your lipid or cholesterol levels are high.  You are older than 59 years of age.  You are at high risk for heart disease.  CANCER SCREENING   Lung Cancer  Lung cancer screening is recommended for adults 55-80 years old who are at high risk for lung cancer because of a history of smoking.  A yearly low-dose CT scan of the lungs is recommended for people who:  Currently smoke.  Have quit within the past 15 years.  Have at least a 30-pack-year history of smoking. A pack year is smoking an average of one pack of cigarettes a day for 1 year.  Yearly screening should continue until it has been 15 years since you quit.  Yearly screening should stop if you develop a health problem that would prevent you from having lung cancer treatment.  Breast Cancer  Practice breast self-awareness. This means understanding how your breasts normally appear and   feel.  It also means doing regular breast self-exams. Let your health care provider know about any changes, no matter how small.  If you are in your 20s or 30s, you should have a clinical breast exam (CBE) by a health care provider every 1-3 years as part of a regular health exam.  If you are 70 or older, have a CBE every year. Also consider having a breast X-ray (mammogram) every year.  If you have a family history of breast cancer, talk to your health care provider about  genetic screening.  If you are at high risk for breast cancer, talk to your health care provider about having an MRI and a mammogram every year.  Breast cancer gene (BRCA) assessment is recommended for women who have family members with BRCA-related cancers. BRCA-related cancers include:  Breast.  Ovarian.  Tubal.  Peritoneal cancers.  Results of the assessment will determine the need for genetic counseling and BRCA1 and BRCA2 testing. Cervical Cancer Routine pelvic examinations to screen for cervical cancer are no longer recommended for nonpregnant women who are considered low risk for cancer of the pelvic organs (ovaries, uterus, and vagina) and who do not have symptoms. A pelvic examination may be necessary if you have symptoms including those associated with pelvic infections. Ask your health care provider if a screening pelvic exam is right for you.   The Pap test is the screening test for cervical cancer for women who are considered at risk.  If you had a hysterectomy for a problem that was not cancer or a condition that could lead to cancer, then you no longer need Pap tests.  If you are older than 65 years, and you have had normal Pap tests for the past 10 years, you no longer need to have Pap tests.  If you have had past treatment for cervical cancer or a condition that could lead to cancer, you need Pap tests and screening for cancer for at least 20 years after your treatment.  If you no longer get a Pap test, assess your risk factors if they change (such as having a new sexual partner). This can affect whether you should start being screened again.  Some women have medical problems that increase their chance of getting cervical cancer. If this is the case for you, your health care provider may recommend more frequent screening and Pap tests.  The human papillomavirus (HPV) test is another test that may be used for cervical cancer screening. The HPV test looks for the virus  that can cause cell changes in the cervix. The cells collected during the Pap test can be tested for HPV.  The HPV test can be used to screen women 42 years of age and older. Getting tested for HPV can extend the interval between normal Pap tests from three to five years.  An HPV test also should be used to screen women of any age who have unclear Pap test results.  After 59 years of age, women should have HPV testing as often as Pap tests.  Colorectal Cancer  This type of cancer can be detected and often prevented.  Routine colorectal cancer screening usually begins at 59 years of age and continues through 59 years of age.  Your health care provider may recommend screening at an earlier age if you have risk factors for colon cancer.  Your health care provider may also recommend using home test kits to check for hidden blood in the stool.  A small camera  at the end of a tube can be used to examine your colon directly (sigmoidoscopy or colonoscopy). This is done to check for the earliest forms of colorectal cancer.  Routine screening usually begins at age 50.  Direct examination of the colon should be repeated every 5-10 years through 59 years of age. However, you may need to be screened more often if early forms of precancerous polyps or small growths are found. Skin Cancer  Check your skin from head to toe regularly.  Tell your health care provider about any new moles or changes in moles, especially if there is a change in a mole's shape or color.  Also tell your health care provider if you have a mole that is larger than the size of a pencil eraser.  Always use sunscreen. Apply sunscreen liberally and repeatedly throughout the day.  Protect yourself by wearing long sleeves, pants, a wide-brimmed hat, and sunglasses whenever you are outside. HEART DISEASE, DIABETES, AND HIGH BLOOD PRESSURE   Have your blood pressure checked at least every 1-2 years. High blood pressure causes  heart disease and increases the risk of stroke.  If you are between 55 years and 79 years old, ask your health care provider if you should take aspirin to prevent strokes.  Have regular diabetes screenings. This involves taking a blood sample to check your fasting blood sugar level.  If you are at a normal weight and have a low risk for diabetes, have this test once every three years after 59 years of age.  If you are overweight and have a high risk for diabetes, consider being tested at a younger age or more often. PREVENTING INFECTION  Hepatitis B  If you have a higher risk for hepatitis B, you should be screened for this virus. You are considered at high risk for hepatitis B if:  You were born in a country where hepatitis B is common. Ask your health care provider which countries are considered high risk.  Your parents were born in a high-risk country, and you have not been immunized against hepatitis B (hepatitis B vaccine).  You have HIV or AIDS.  You use needles to inject street drugs.  You live with someone who has hepatitis B.  You have had sex with someone who has hepatitis B.  You get hemodialysis treatment.  You take certain medicines for conditions, including cancer, organ transplantation, and autoimmune conditions. Hepatitis C  Blood testing is recommended for:  Everyone born from 1945 through 1965.  Anyone with known risk factors for hepatitis C. Sexually transmitted infections (STIs)  You should be screened for sexually transmitted infections (STIs) including gonorrhea and chlamydia if:  You are sexually active and are younger than 59 years of age.  You are older than 59 years of age and your health care provider tells you that you are at risk for this type of infection.  Your sexual activity has changed since you were last screened and you are at an increased risk for chlamydia or gonorrhea. Ask your health care provider if you are at risk.  If you do not  have HIV, but are at risk, it may be recommended that you take a prescription medicine daily to prevent HIV infection. This is called pre-exposure prophylaxis (PrEP). You are considered at risk if:  You are sexually active and do not regularly use condoms or know the HIV status of your partner(s).  You take drugs by injection.  You are sexually active with a partner   who has HIV. Talk with your health care provider about whether you are at high risk of being infected with HIV. If you choose to begin PrEP, you should first be tested for HIV. You should then be tested every 3 months for as long as you are taking PrEP.  PREGNANCY   If you are premenopausal and you may become pregnant, ask your health care provider about preconception counseling.  If you may become pregnant, take 400 to 800 micrograms (mcg) of folic acid every day.  If you want to prevent pregnancy, talk to your health care provider about birth control (contraception). OSTEOPOROSIS AND MENOPAUSE   Osteoporosis is a disease in which the bones lose minerals and strength with aging. This can result in serious bone fractures. Your risk for osteoporosis can be identified using a bone density scan.  If you are 65 years of age or older, or if you are at risk for osteoporosis and fractures, ask your health care provider if you should be screened.  Ask your health care provider whether you should take a calcium or vitamin D supplement to lower your risk for osteoporosis.  Menopause may have certain physical symptoms and risks.  Hormone replacement therapy may reduce some of these symptoms and risks. Talk to your health care provider about whether hormone replacement therapy is right for you.  HOME CARE INSTRUCTIONS   Schedule regular health, dental, and eye exams.  Stay current with your immunizations.   Do not use any tobacco products including cigarettes, chewing tobacco, or electronic cigarettes.  If you are pregnant, do not  drink alcohol.  If you are breastfeeding, limit how much and how often you drink alcohol.  Limit alcohol intake to no more than 1 drink per day for nonpregnant women. One drink equals 12 ounces of beer, 5 ounces of wine, or 1 ounces of hard liquor.  Do not use street drugs.  Do not share needles.  Ask your health care provider for help if you need support or information about quitting drugs.  Tell your health care provider if you often feel depressed.  Tell your health care provider if you have ever been abused or do not feel safe at home. Document Released: 11/20/2010 Document Revised: 09/21/2013 Document Reviewed: 04/08/2013 Exit[employer] Patient Information 2015 [employer]. This information is not intended to replace advice given to you by your health care provider. Make sure you discuss any questions you have with your health care provider.  

## 2015-01-25 NOTE — Addendum Note (Signed)
Addended by: Gwendolyn Grant A on: 01/25/2015 10:29 PM   Modules accepted: Orders, SmartSet

## 2015-01-26 LAB — HEPATITIS C ANTIBODY: HCV Ab: NEGATIVE

## 2015-02-15 ENCOUNTER — Encounter: Payer: Self-pay | Admitting: Certified Nurse Midwife

## 2015-02-15 ENCOUNTER — Ambulatory Visit (INDEPENDENT_AMBULATORY_CARE_PROVIDER_SITE_OTHER): Payer: 59 | Admitting: Certified Nurse Midwife

## 2015-02-15 VITALS — BP 120/60 | HR 80 | Resp 14 | Ht 62.5 in | Wt 163.0 lb

## 2015-02-15 DIAGNOSIS — Z124 Encounter for screening for malignant neoplasm of cervix: Secondary | ICD-10-CM | POA: Diagnosis not present

## 2015-02-15 DIAGNOSIS — Z01419 Encounter for gynecological examination (general) (routine) without abnormal findings: Secondary | ICD-10-CM | POA: Diagnosis not present

## 2015-02-15 NOTE — Patient Instructions (Addendum)

## 2015-02-15 NOTE — Progress Notes (Signed)
Patient ID: Cheryl Chan, female   DOB: 03-Sep-1955, 59 y.o.   MRN: [id number] 59 y.o. G5P4 Married  Caucasian Fe here for annual exam . Menopausal no HRT. Denies vaginal bleeding or vaginal dryness. Sees Dr. Asa Lente for medication management of Hypothyroid and Zoloft which are stable per patient and labs.  Coping with older parent health issues and possible placement in extended care. Has emotional support from family and friends. Feeling well , no health issue today.  Patient's last menstrual period was 05/22/2003.          Sexually active: No.  The current method of family planning is hysterectomy  and post menopausal status.    Exercising: Yes.    walking Smoker:  no  Health Maintenance: Pap:  02-10-14 WNL  MMG:  08-03-14 WNL Density B Colonoscopy:  2008 WNL negative BMD:   07-29-14 Normal TDaP:  08-03-12  Labs: PCP does labs  Self Breast Exam: sometimes   reports that she has never smoked. She has never used smokeless tobacco. She reports that she drinks about 0.5 - 1.0 oz of alcohol per week. She reports that she does not use illicit drugs.  Past Medical History  Diagnosis Date  . Hypothyroidism   . Hyperlipidemia   . Arthritis   . 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 History of chicken pox   . 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 Endometrial cancer 10/2011 dx    s/p vag hyst 11/2011  . Diet-controlled type 2 diabetes mellitus   . Complex endometrial hyperplasia 2005  . Anxiety   . Rotator cuff injury     Past Surgical History  Procedure Laterality Date  . Cesarean section      x's 4  . Fracture surgery      nose as a child  . Dilation and curettage of uterus      hysteroscopy  . Robotic assisted lap vaginal hysterectomy  12/04/2011    Procedure: ROBOTIC ASSISTED LAPAROSCOPIC VAGINAL HYSTERECTOMY;  Surgeon: Imagene Gurney A. Alycia Rossetti, MD;  Location: [facility];  Service: Gynecology;  Laterality: N/A;  . Open reduction internal fixation (orif) distal phalanx Left 08/05/2012     Procedure: OPEN REDUCTION INTERNAL FIXATION (ORIF) DISTAL PHALANX VERSUS EXTERNAL FIXATION LEFT RING FINGER;  Surgeon: Tennis Must, MD;  Location: [place];  Service: Orthopedics;  Laterality: Left;  . Abdominal hysterectomy    . Shoulder arthroscopy with rotator cuff repair and subacromial decompression Left 11/27/2013    Procedure: LEFT SHOULDER ARTHROSCOPY, SUBACROMIAL DECOMPRESSION, PARTIAL ACROMIOPLASTY WITH  CORACOACROMIAL RELEASE, DISTAL CLAVICULECTOMY WITH ROTATOR CUFF REPAIR AND EXTENSIVE DEBRIDEMENT;  Surgeon: Lorn Junes, MD;  Location: [place];  Service: Orthopedics;  Laterality: Left;    Current Outpatient Prescriptions  Medication Sig Dispense Refill  . diclofenac (VOLTAREN) 75 MG EC tablet Take 75 mg by mouth 2 (two) times daily.  2  . fluticasone (FLONASE) 50 MCG/ACT nasal spray Place 2 sprays into both nostrils daily. 48 g 0  . levothyroxine (SYNTHROID, LEVOTHROID) 150 MCG tablet TAKE 1 TABLET BY MOUTH ONCE DAILY 90 tablet 3  . Liraglutide 18 MG/3ML SOPN Inject 0.1 mLs (0.6 mg total) into the skin daily. 6 mL 1  . sertraline (ZOLOFT) 100 MG tablet Take 1 tablet (100 mg total) by mouth daily. 90 tablet 3   No current facility-administered medications for this visit.    Family History  Problem Relation Age of Onset  .  Heart disease Brother 33    cardiomyopathy s/p ICD, on transplant list  . Cancer Father 21    prostrate  . Hypertension Father   . Arthritis Mother   . Stroke Father     in setting of sepsis f/ RMSF    ROS:  Pertinent items are noted in HPI.  Otherwise, a comprehensive ROS was negative.  Exam:   LMP 05/22/2003   Ht Readings from Last 3 Encounters:  01/25/15 5' 3.25" (1.607 m)  08/26/14 5' 3.25" (1.607 m)  03/31/14 5' 3.25" (1.607 m)    General appearance: alert, cooperative and appears stated age Head: Normocephalic, without obvious abnormality, atraumatic Neck: no adenopathy, supple, symmetrical,  trachea midline and thyroid normal to inspection and palpation Lungs: clear to auscultation bilaterally Breasts: normal appearance, no masses or tenderness, No nipple retraction or dimpling, No nipple discharge or bleeding, No axillary or supraclavicular adenopathy Heart: regular rate and rhythm Abdomen: soft, non-tender; no masses,  no organomegaly Extremities: extremities normal, atraumatic, no cyanosis or edema Skin: Skin color, texture, turgor normal. No rashes or lesions Lymph nodes: Cervical, supraclavicular, and axillary nodes normal. No abnormal inguinal nodes palpated Neurologic: Grossly normal   Pelvic: External genitalia:  no lesions              Urethra:  normal appearing urethra with no masses, tenderness or lesions              Bartholin's and Skene's: normal                 Vagina: normal appearing vagina with normal color and discharge, no lesions              Cervix: absent              Pap taken: Yes.   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  Pap smear as above with HPV reflex   counseled on breast self exam, mammography screening, adequate intake of calcium and vitamin D, diet and exercise  return annually or prn  An After Visit Summary was printed and given to the patient.

## 2015-02-16 LAB — IPS PAP TEST WITH REFLEX TO HPV

## 2015-02-17 NOTE — Progress Notes (Signed)
Encounter reviewed Jill Jertson, MD   

## 2015-04-18 ENCOUNTER — Other Ambulatory Visit: Payer: Self-pay | Admitting: Internal Medicine

## 2015-04-26 LAB — HM DIABETES EYE EXAM

## 2015-05-11 ENCOUNTER — Encounter: Payer: Self-pay | Admitting: Internal Medicine

## 2015-05-18 ENCOUNTER — Encounter: Payer: Self-pay | Admitting: Family Medicine

## 2015-05-18 ENCOUNTER — Ambulatory Visit (INDEPENDENT_AMBULATORY_CARE_PROVIDER_SITE_OTHER): Payer: 59 | Admitting: Family Medicine

## 2015-05-18 VITALS — BP 118/86 | HR 83 | Temp 98.6°F | Ht 62.5 in | Wt 167.8 lb

## 2015-05-18 DIAGNOSIS — J209 Acute bronchitis, unspecified: Secondary | ICD-10-CM | POA: Insufficient documentation

## 2015-05-18 MED ORDER — HYDROCOD POLST-CPM POLST ER 10-8 MG/5ML PO SUER: 5.0000 mL | Freq: Two times a day (BID) | ORAL | Status: DC | PRN

## 2015-05-18 MED ORDER — PREDNISONE 50 MG PO TABS: ORAL_TABLET | ORAL | Status: DC

## 2015-05-18 NOTE — Assessment & Plan Note (Signed)
New acute problem. No indication for antibiotics. Treating with Prednisone and Tussionex.

## 2015-05-18 NOTE — Progress Notes (Signed)
Subjective:  Patient ID: Cheryl Chan, female    DOB: 01-06-1956  Age: 59 y.o. MRN: [id number]  CC: Cough  HPI:  59 year old female presents to clinic today for an acute visit with complaints of cough.  Cough  Patient reports that she's had a cough for the past 5 days.  She states that the cough is "deep" and "barky".  She states the cough is severe.  She reports associated fatigue.  No reports of fever or chills.  She has been taking over-the-counter NyQuil and Mucinex with no improvement.  No exacerbating factors.  She reports some shortness of breath associated with the cough.  No other complaints this time.  Social Hx   Social History   Social History  . Marital Status: Married    Spouse Name: N/A  . Number of Children: N/A  . Years of Education: N/A   Social History Main Topics  . Smoking status: Never Smoker   . Smokeless tobacco: Never Used  . Alcohol Use: 0.5 - 1.0 oz/week    1-2 drink(s) per week     Comment: social  . Drug Use: No  . Sexual Activity: No     Comment: hysterectomy   Other Topics Concern  . 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  Respiratory: Positive for cough.    Objective:  BP 118/86 mmHg  Pulse 83  Temp(Src) 98.6 F (37 C) (Oral)  Ht 5' 2.5" (1.588 m)  Wt 167 lb 12 oz (76.091 kg)  BMI 30.17 kg/m2  SpO2 97%  LMP 05/22/2003  BP/Weight 05/18/2015 [id number]  Systolic BP 123456 [id number] [id number]  Diastolic BP 86 60 80  Wt. (Lbs) 167.75 163 167  BMI 30.17 29.32 29.33    Physical Exam  Constitutional: She appears well-developed. No distress.  HENT:  Head: Normocephalic and atraumatic.  Oropharynx with mild erythema. R TM obscured by cerumen. Left TM normal.  Eyes: Conjunctivae are normal.  Neck: Neck supple.    Cardiovascular: Normal rate and regular rhythm.   Pulmonary/Chest: Effort normal and breath sounds normal. No respiratory distress. She has no wheezes. She has no rales.  Lymphadenopathy:    She has no cervical adenopathy.  Neurological: She is alert.  Psychiatric: She has a normal mood and affect.  Vitals reviewed.  Lab Results  Component Value Date   WBC 4.4 01/25/2015   HGB 12.6 01/25/2015   HCT 37.5 01/25/2015   PLT 272.0 01/25/2015   GLUCOSE 148* 01/25/2015   CHOL 288* 01/25/2015   TRIG 109.0 01/25/2015   HDL 55.80 01/25/2015   LDLDIRECT 148.7 09/23/2012   LDLCALC 211* 01/25/2015   ALT 19 01/25/2015   AST 18 01/25/2015   NA 141 01/25/2015   K 4.3 01/25/2015   CL 105 01/25/2015   CREATININE 0.55 01/25/2015   BUN 23 01/25/2015   CO2 30 01/25/2015   TSH 0.47 01/25/2015   HGBA1C 7.4* 01/25/2015   MICROALBUR 0.7 01/25/2015   Assessment & Plan:   Problem List Items Addressed This Visit    Acute bronchitis - Primary    New acute problem. No indication for antibiotics. Treating with Prednisone and Tussionex.         Meds ordered this encounter  Medications  . predniSONE (  DELTASONE) 50 MG tablet    Sig: 1 tablet daily x 5 days.    Dispense:  5 tablet    Refill:  0  . chlorpheniramine-HYDROcodone (TUSSIONEX PENNKINETIC ER) 10-8 MG/5ML SUER    Sig: Take 5 mLs by mouth every 12 (twelve) hours as needed.    Dispense:  115 mL    Refill:  0    Follow-up: PRN  [place]

## 2015-05-18 NOTE — Progress Notes (Signed)
Pre visit review using our clinic review tool, if applicable. No additional management support is needed unless otherwise documented below in the visit note. 

## 2015-05-18 NOTE — Patient Instructions (Signed)
Take the prednisone as directed. Use the cough syrup twice daily as needed. Be cautious, it will make you tired/groggy.   Acute Bronchitis Bronchitis is inflammation of the airways that extend from the windpipe into the lungs (bronchi). The inflammation often causes mucus to develop. This leads to a cough, which is the most common symptom of bronchitis.  In acute bronchitis, the condition usually develops suddenly and goes away over time, usually in a couple weeks. Smoking, allergies, and asthma can make bronchitis worse. Repeated episodes of bronchitis may cause further lung problems.  CAUSES Acute bronchitis is most often caused by the same virus that causes a cold. The virus can spread from person to person (contagious) through coughing, sneezing, and touching contaminated objects. SIGNS AND SYMPTOMS   Cough.   Fever.   Coughing up mucus.   Body aches.   Chest congestion.   Chills.   Shortness of breath.   Sore throat.  DIAGNOSIS  Acute bronchitis is usually diagnosed through a physical exam. Your health care provider will also ask you questions about your medical history. Tests, such as chest X-rays, are sometimes done to rule out other conditions.  TREATMENT  Acute bronchitis usually goes away in a couple weeks. Oftentimes, no medical treatment is necessary. Medicines are sometimes given for relief of fever or cough. Antibiotic medicines are usually not needed but may be prescribed in certain situations. In some cases, an inhaler may be recommended to help reduce shortness of breath and control the cough. A cool mist vaporizer may also be used to help thin bronchial secretions and make it easier to clear the chest.  HOME CARE INSTRUCTIONS  Get plenty of rest.   Drink enough fluids to keep your urine clear or pale yellow (unless you have a medical condition that requires fluid restriction). Increasing fluids may help thin your respiratory secretions (sputum) and reduce  chest congestion, and it will prevent dehydration.   Take medicines only as directed by your health care provider.  If you were prescribed an antibiotic medicine, finish it all even if you start to feel better.  Avoid smoking and secondhand smoke. Exposure to cigarette smoke or irritating chemicals will make bronchitis worse. If you are a smoker, consider using nicotine gum or skin patches to help control withdrawal symptoms. Quitting smoking will help your lungs heal faster.   Reduce the chances of another bout of acute bronchitis by washing your hands frequently, avoiding people with cold symptoms, and trying not to touch your hands to your mouth, nose, or eyes.   Keep all follow-up visits as directed by your health care provider.  SEEK MEDICAL CARE IF: Your symptoms do not improve after 1 week of treatment.  SEEK IMMEDIATE MEDICAL CARE IF:  You develop an increased fever or chills.   You have chest pain.   You have severe shortness of breath.  You have bloody sputum.   You develop dehydration.  You faint or repeatedly feel like you are going to pass out.  You develop repeated vomiting.  You develop a severe headache. MAKE SURE YOU:   Understand these instructions.  Will watch your condition.  Will get help right away if you are not doing well or get worse.   This information is not intended to replace advice given to you by your health care provider. Make sure you discuss any questions you have with your health care provider.   Document Released: 06/14/2004 Document Revised: 05/28/2014 Document Reviewed: 10/28/2012 Elsevier Interactive Patient Education  2016 [employer].

## 2015-05-31 ENCOUNTER — Encounter: Payer: Self-pay | Admitting: Internal Medicine

## 2015-06-28 ENCOUNTER — Telehealth: Payer: Self-pay | Admitting: Internal Medicine

## 2015-06-28 DIAGNOSIS — E119 Type 2 diabetes mellitus without complications: Secondary | ICD-10-CM

## 2015-06-28 DIAGNOSIS — E039 Hypothyroidism, unspecified: Secondary | ICD-10-CM

## 2015-06-28 NOTE — Telephone Encounter (Signed)
Blood work ordered, along with a tsh to recheck thyroid

## 2015-06-28 NOTE — Telephone Encounter (Signed)
LVM informing pt

## 2015-06-28 NOTE — Telephone Encounter (Signed)
Pt has a transfer from [place] [employer] in April and she is wondering if you can put lab orders in ahead of time. She asked for just the basic and A1c and she would be willing to get more done afterward if needed. She's not worried about what the insurance will pay.  Please advise

## 2015-06-28 NOTE — Telephone Encounter (Signed)
Please advise 

## 2015-07-04 ENCOUNTER — Other Ambulatory Visit: Payer: Self-pay | Admitting: Internal Medicine

## 2015-07-04 MED FILL — DICLOFENAC SOD EC 75 MG TAB: 75 | 30 days supply | Qty: 60 | Fill #0

## 2015-07-05 MED FILL — SERTRALINE HCL 100 MG TAB: 100 | 90 days supply | Qty: 90 | Fill #2

## 2015-07-05 MED FILL — LEVOTHYROXINE 150 MCG TAB: 150 | 90 days supply | Qty: 90 | Fill #0

## 2015-07-26 ENCOUNTER — Ambulatory Visit: Payer: 59 | Admitting: Internal Medicine

## 2015-07-27 MED FILL — FLUTICASONE PROP 50 MCG SPR: 50 | 90 days supply | Qty: 48 | Fill #1

## 2015-08-22 ENCOUNTER — Ambulatory Visit (INDEPENDENT_AMBULATORY_CARE_PROVIDER_SITE_OTHER): Payer: 59 | Admitting: Infectious Diseases

## 2015-08-22 ENCOUNTER — Encounter: Payer: Self-pay | Admitting: Infectious Diseases

## 2015-08-22 VITALS — BP 132/75 | HR 72 | Temp 98.3°F | Wt 167.0 lb

## 2015-08-22 DIAGNOSIS — J028 Acute pharyngitis due to other specified organisms: Secondary | ICD-10-CM | POA: Diagnosis not present

## 2015-08-22 DIAGNOSIS — R21 Rash and other nonspecific skin eruption: Secondary | ICD-10-CM

## 2015-08-22 DIAGNOSIS — J029 Acute pharyngitis, unspecified: Secondary | ICD-10-CM | POA: Insufficient documentation

## 2015-08-22 MED ORDER — AZITHROMYCIN 250 MG PO TABS: ORAL_TABLET | ORAL | Status: DC

## 2015-08-22 MED FILL — AZITHROMYCIN 250 MG TABLET: 250 | 5 days supply | Qty: 6 | Fill #0

## 2015-08-22 NOTE — Assessment & Plan Note (Addendum)
Will give her z-pack Some concern that she has a strep rash.  She does not have sx of allergic rhinitis, and only coughed and sniffled once during the exam.

## 2015-08-22 NOTE — Assessment & Plan Note (Signed)
Not clear what cause is- her rash predates her new dog. She used her own soaps, shampoos while traveling. It could be from the detergents/laundry where she stayed. It is possible that this is from strep, would not expect it to be itchy?

## 2015-08-22 NOTE — Progress Notes (Signed)
   Subjective:    Patient ID: LAMONA MCCULLY, female    DOB: [birth year], 60 y.o.   MRN: [id number]  HPI 60 yo F with hx of DM2, hyperlipidemia, here to be seen for sore throat.  Has had for last 4 days. More of an annoyance.  No f/c. No dysphagia. No rhinorrhea.  Has had headache.  Has also had rash on which started on pantline in back. Was pruritic. Now has on abd, groin, UE.   No new medicines.  Was in [place], [place] recently working.   Up all night after rescuing a puppy.   Review of Systems  Constitutional: Negative for fever, chills and appetite change.  HENT: Positive for sore throat. Negative for mouth sores, sinus pressure and trouble swallowing.   Respiratory: Negative for cough.   Gastrointestinal: Negative for diarrhea and constipation.  Genitourinary: Negative for difficulty urinating.  Skin: Positive for rash.       Objective:   Physical Exam  Constitutional: She appears well-developed and well-nourished.  HENT:  Ears:  Mouth/Throat: No oropharyngeal exudate.    Eyes: EOM are normal. Pupils are equal, round, and reactive to light.  Neck: Neck supple.  Cardiovascular: Normal rate, regular rhythm and normal heart sounds.   Pulmonary/Chest: Effort normal and breath sounds normal.  Abdominal: Soft. Bowel sounds are normal. There is no tenderness. There is no rebound.  Lymphadenopathy:    She has no cervical adenopathy.  Skin: Rash noted.       Assessment & Plan:

## 2015-08-24 ENCOUNTER — Ambulatory Visit (INDEPENDENT_AMBULATORY_CARE_PROVIDER_SITE_OTHER): Payer: 59 | Admitting: Internal Medicine

## 2015-08-24 ENCOUNTER — Encounter: Payer: Self-pay | Admitting: Internal Medicine

## 2015-08-24 ENCOUNTER — Other Ambulatory Visit (INDEPENDENT_AMBULATORY_CARE_PROVIDER_SITE_OTHER): Payer: 59

## 2015-08-24 VITALS — BP 144/84 | HR 70 | Temp 97.9°F | Resp 16 | Wt 167.0 lb

## 2015-08-24 DIAGNOSIS — F329 Major depressive disorder, single episode, unspecified: Secondary | ICD-10-CM | POA: Diagnosis not present

## 2015-08-24 DIAGNOSIS — E039 Hypothyroidism, unspecified: Secondary | ICD-10-CM

## 2015-08-24 DIAGNOSIS — F32A Depression, unspecified: Secondary | ICD-10-CM

## 2015-08-24 DIAGNOSIS — E119 Type 2 diabetes mellitus without complications: Secondary | ICD-10-CM

## 2015-08-24 LAB — LIPID PANEL
CHOL/HDL RATIO: 5
Cholesterol: 280 mg/dL — ABNORMAL HIGH (ref 0–200)
HDL: 57.5 mg/dL (ref >39.00)
LDL CALC: 202 mg/dL — AB (ref 0–99)
NonHDL: 222.49
TRIGLYCERIDES: 103 mg/dL (ref 0.0–149.0)
VLDL: 20.6 mg/dL (ref 0.0–40.0)

## 2015-08-24 LAB — COMPREHENSIVE METABOLIC PANEL
ALK PHOS: 64 U/L (ref 39–117)
ALT: 12 U/L (ref 0–35)
AST: 13 U/L (ref 0–37)
Albumin: 4.2 g/dL (ref 3.5–5.2)
BILIRUBIN TOTAL: 0.4 mg/dL (ref 0.2–1.2)
BUN: 30 mg/dL — ABNORMAL HIGH (ref 6–23)
CO2: 30 meq/L (ref 19–32)
Calcium: 9.3 mg/dL (ref 8.4–10.5)
Chloride: 103 mEq/L (ref 96–112)
Creatinine, Ser: 0.56 mg/dL (ref 0.40–1.20)
GFR: 117.66 mL/min (ref >60.00)
GLUCOSE: 180 mg/dL — AB (ref 70–99)
POTASSIUM: 4.6 meq/L (ref 3.5–5.1)
Sodium: 139 mEq/L (ref 135–145)
TOTAL PROTEIN: 7.1 g/dL (ref 6.0–8.3)

## 2015-08-24 LAB — HEMOGLOBIN A1C: Hgb A1c MFr Bld: 8 % — ABNORMAL HIGH (ref 4.6–6.5)

## 2015-08-24 LAB — TSH: TSH: 1.75 u[IU]/mL (ref 0.35–4.50)

## 2015-08-24 NOTE — Assessment & Plan Note (Signed)
She stopped taking the victoza - simply got out of the habit of taking it, but denies any side effects Stressed change in her diet, increasing exercise and working on weight loss If A1c elevated we will restart the victoza

## 2015-08-24 NOTE — Assessment & Plan Note (Signed)
Controlled, stable Continue current dose of sertraline 

## 2015-08-24 NOTE — Patient Instructions (Addendum)

## 2015-08-24 NOTE — Assessment & Plan Note (Signed)
Check tsh  Titrate med dose if needed  

## 2015-08-24 NOTE — Progress Notes (Signed)
Subjective:    Patient ID: Cheryl Chan, female    DOB: 1955-12-29, 60 y.o.   MRN: [id number]  HPI She is here to establish with a new pcp.    Pharyngitis, headache, rash:  She started to have a sore throat and headache 4 days ago.  She then developed a rash at some point.  She was seen two days ago and they thought she may have scarlet fever.  No testing was done.  Her rash is itching - she is not sure if the rash is improving.  She has taken benadryl at one point, but it makes her drowsy.  She was placed on a zpak and denies a sore throat now.    Hypothyroidism:  She is taking her medication daily.  She denies any recent changes in energy or weight that are unexplained.   Diabetes: She stopped taking the victoza - denies side effects. She just got out of the habit of taking it.  She is not compliant with a diabetic diet. She is exercising regularly - one day with trainer, walking the dog.  She is up-to-date with an ophthalmology examination.   Depression: She is taking her medication daily as prescribed. She denies any side effects from the medication. She feels her depression is well controlled and she is happy with her current dose of medication.     Medications and allergies reviewed with patient and updated if appropriate.  Patient Active Problem List   Diagnosis Date Noted  . Acute pharyngitis 08/22/2015  . Rash and nonspecific skin eruption 08/22/2015  . Acute bronchitis 05/18/2015  . Overweight 01/25/2015  . Rotator cuff injury   . Uterine cancer ([place]) 11/12/2011  . Diet-controlled type 2 diabetes mellitus ([place])   . Hypothyroidism   . Hyperlipidemia   . Depression     Current Outpatient Prescriptions on File Prior to Visit  Medication Sig Dispense Refill  . azithromycin (ZITHROMAX Z-PAK) 250 MG tablet 2 pills on day 1, then 1 pill on days 2-5 6 each 0  . diclofenac (VOLTAREN) 75 MG EC tablet Take 75 mg by mouth 2 (two) times daily.  2  . fluticasone (FLONASE) 50  MCG/ACT nasal spray Place 2 sprays into both nostrils daily. Must establish with NEW PCP for additional refills. 48 g 2  . levothyroxine (SYNTHROID, LEVOTHROID) 150 MCG tablet Take 1 tablet (150 mcg total) by mouth daily. Keep April appt w/new provider for future refills 90 tablet 0  . sertraline (ZOLOFT) 100 MG tablet Take 1 tablet (100 mg total) by mouth daily. 90 tablet 3   No current facility-administered medications on file prior to visit.    Past Medical History  Diagnosis Date  . Hypothyroidism   . Hyperlipidemia   . Arthritis   . 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 History of chicken pox   . 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 Endometrial cancer ([place]) 10/2011 dx    s/p vag hyst 11/2011  . Diet-controlled type 2 diabetes mellitus ([place])   . Complex endometrial hyperplasia 2005  . Anxiety   . Rotator cuff injury     Past Surgical History  Procedure Laterality Date  . Cesarean section      x's 4  . Fracture surgery      nose as a child  . Dilation and curettage of uterus      hysteroscopy  . Robotic assisted lap vaginal hysterectomy  12/04/2011  Procedure: ROBOTIC ASSISTED LAPAROSCOPIC VAGINAL HYSTERECTOMY;  Surgeon: Imagene Gurney A. Alycia Rossetti, MD;  Location: [facility];  Service: Gynecology;  Laterality: N/A;  . Open reduction internal fixation (orif) distal phalanx Left 08/05/2012    Procedure: OPEN REDUCTION INTERNAL FIXATION (ORIF) DISTAL PHALANX VERSUS EXTERNAL FIXATION LEFT RING FINGER;  Surgeon: Tennis Must, MD;  Location: [facility];  Service: Orthopedics;  Laterality: Left;  . Abdominal hysterectomy    . Shoulder arthroscopy with rotator cuff repair and subacromial decompression Left 11/27/2013    Procedure: LEFT SHOULDER ARTHROSCOPY, SUBACROMIAL DECOMPRESSION, PARTIAL ACROMIOPLASTY WITH  CORACOACROMIAL RELEASE, DISTAL CLAVICULECTOMY WITH ROTATOR CUFF REPAIR AND EXTENSIVE DEBRIDEMENT;  Surgeon: Lorn Junes, MD;  Location: [facility];  Service: Orthopedics;  Laterality: Left;    Social History   Social History  . Marital Status: Married    Spouse Name: N/A  . Number of Children: N/A  . Years of Education: N/A   Social History Main Topics  . Smoking status: Never Smoker   . Smokeless tobacco: Never Used  . Alcohol Use: 0.5 - 1.0 oz/week    1-2 drink(s) per week     Comment: social  . Drug Use: No  . Sexual Activity: No     Comment: hysterectomy   Other Topics Concern  . 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 Heart disease Brother 21    cardiomyopathy s/p ICD, on transplant list  . Cancer Father 1    prostrate  . Hypertension Father   . Arthritis Mother   . Stroke Father     in setting of sepsis f/ RMSF    Review of Systems  Constitutional: Negative for fever.  HENT: Negative for sore throat.   Respiratory: Positive for cough (mild). Negative for shortness of breath and wheezing.   Cardiovascular: Negative for chest pain, palpitations and leg swelling.  Gastrointestinal: Positive for anal bleeding. Negative for nausea and abdominal pain.  Skin: Positive for rash.  Neurological: Positive for light-headedness and headaches.       Objective:   Filed Vitals:   08/24/15 0842  BP: 144/84  Pulse: 70  Temp: 97.9 F (36.6 C)  Resp: 16   Filed Weights   08/24/15 0842  Weight: 167 lb (75.751 kg)   Body mass index is 30.04 kg/(m^2).   Physical Exam Constitutional: Appears well-developed and well-nourished. No distress.  Neck: Neck supple. No tracheal deviation present. No thyromegaly present.  No carotid bruit. No cervical adenopathy.   Cardiovascular: Normal rate, regular rhythm and normal heart sounds.   No murmur heard.  No edema Pulmonary/Chest: Effort normal and breath  sounds normal. No respiratory distress. No wheezes.        Assessment & Plan:    Scarlet fever? Rash, pharyngitis: On zpak She will let me know if the rash does not resolve in the next day or two - may need further evaluation if it does not  See Problem List for Assessment and Plan of chronic medical problems.  Follow up in 6 months

## 2015-08-24 NOTE — Progress Notes (Signed)
Pre visit review using our clinic review tool, if applicable. No additional management support is needed unless otherwise documented below in the visit note. 

## 2015-08-31 ENCOUNTER — Encounter: Payer: Self-pay | Admitting: Gynecologic Oncology

## 2015-08-31 ENCOUNTER — Ambulatory Visit: Payer: 59 | Attending: Gynecologic Oncology | Admitting: Gynecologic Oncology

## 2015-08-31 VITALS — BP 125/64 | HR 77 | Temp 97.6°F | Resp 18 | Ht 62.5 in | Wt 162.6 lb

## 2015-08-31 DIAGNOSIS — F329 Major depressive disorder, single episode, unspecified: Secondary | ICD-10-CM | POA: Diagnosis not present

## 2015-08-31 DIAGNOSIS — E785 Hyperlipidemia, unspecified: Secondary | ICD-10-CM | POA: Insufficient documentation

## 2015-08-31 DIAGNOSIS — Z79899 Other long term (current) drug therapy: Secondary | ICD-10-CM | POA: Diagnosis not present

## 2015-08-31 DIAGNOSIS — E039 Hypothyroidism, unspecified: Secondary | ICD-10-CM | POA: Diagnosis not present

## 2015-08-31 DIAGNOSIS — E119 Type 2 diabetes mellitus without complications: Secondary | ICD-10-CM | POA: Insufficient documentation

## 2015-08-31 DIAGNOSIS — Z8542 Personal history of malignant neoplasm of other parts of uterus: Secondary | ICD-10-CM | POA: Diagnosis not present

## 2015-08-31 DIAGNOSIS — F419 Anxiety disorder, unspecified: Secondary | ICD-10-CM | POA: Diagnosis not present

## 2015-08-31 DIAGNOSIS — C541 Malignant neoplasm of endometrium: Secondary | ICD-10-CM | POA: Diagnosis not present

## 2015-08-31 DIAGNOSIS — Z8042 Family history of malignant neoplasm of prostate: Secondary | ICD-10-CM | POA: Diagnosis not present

## 2015-08-31 NOTE — Progress Notes (Signed)
Consult Note: Gyn-Onc  Cheryl Chan 60 y.o. female  CC:  Chief Complaint  Patient presents with  . endometrial cancer    MD follow up visit    HPI: Patient is a 60 year old who was initially seen by Dr. Colonel Bald at [facility]. She was referred by Dr. Edwinna Areola for endometrial carcinoma.   She had experienced a gush of fluid was seen by her gynecologist. Endometrial biopsy revealed a grade 1 endometrial cancer. Her prior history was menopause approximately 5 years prior. After discussion she opted for surgical management. On July 16 of year 2013 she underwent a total robotic hysterectomy bilateral salpingo-oophorectomy and right pelvic lymph node dissection. The operative findings included normal uterus cervix and adnexa. There is adhesive disease of the bladder to the anterior uterus. Frozen section revealed a grade 1 endometrial adenocarcinoma with minimal to no myometrial invasion.   Final pathology revealed a grade 1 endometrioid adenocarcinoma that extended 0.4 cm with the myometrium was 1.2 cm in thickness. There was no lymphovascular space involvement. The maximum tumor size was 1.5 cm it was grade 1 lesion. The adnexa were negative and 0/5 right pelvic lymph nodes were involved. I last saw her in 2016 and she was last seen by Dr. Sabra Heck in the interim. At that time and exam and Pap smear were both done. She comes in today for followup. I last saw her in April 2016 at which time she was doing quite well.  Interval History:  She is overall doing quite well. Her son and her grandson moved to [place] so she does not get to see Marland Kitchen quite as often. She is trying to lose weight. Her blood sugars have been elevated. She is proceeding with diet and exercise and is lost 3 pounds since we saw her last year. She's walking every day about one to 2 miles. She is up-to-date on her mammograms. There are no new medical problems and her family. She did have a rash about a week ago she thinks she might  have scarlet fever. It was taken care of with a Z-Pak.  Review of Systems: She denies any chest pain, shortness of breath, nausea, vomiting, chest pain, shortness of breath. She has not noticed any bleeding or change in her bowel or bladder habits. She has a slight cough and is not sure if it is from allergies or a cold. 10 point review of systems is otherwise negative.  Current Meds:  Outpatient Encounter Prescriptions as of 08/31/2015  Medication Sig  . diclofenac (VOLTAREN) 75 MG EC tablet Take 75 mg by mouth 2 (two) times daily.  . fluticasone (FLONASE) 50 MCG/ACT nasal spray Place 2 sprays into both nostrils daily. Must establish with NEW PCP for additional refills.  Marland Kitchen levothyroxine (SYNTHROID, LEVOTHROID) 150 MCG tablet Take 1 tablet (150 mcg total) by mouth daily. Keep April appt w/new provider for future refills  . sertraline (ZOLOFT) 100 MG tablet Take 1 tablet (100 mg total) by mouth daily.  . [DISCONTINUED] azithromycin (ZITHROMAX Z-PAK) 250 MG tablet 2 pills on day 1, then 1 pill on days 2-5   No facility-administered encounter medications on file as of 08/31/2015.    Allergy:  Allergies  Allergen Reactions  . Crestor [Rosuvastatin Calcium]     Muscle ache  . Demerol [Meperidine]   . Statins     Social Hx:   Social History   Social History  . Marital Status: Married    Spouse Name: N/A  . Number of Children: N/A  .  Years of Education: N/A   Occupational History  . Not on file.   Social History Main Topics  . Smoking status: Never Smoker   . Smokeless tobacco: Never Used  . Alcohol Use: 0.5 - 1.0 oz/week    1-2 drink(s) per week     Comment: social  . Drug Use: No  . Sexual Activity: No     Comment: hysterectomy   Other Topics Concern  . 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 Cesarean section      x's 4  . Fracture surgery      nose as a child  . Dilation and curettage of uterus      hysteroscopy  . Robotic assisted lap vaginal hysterectomy  12/04/2011    Procedure: ROBOTIC ASSISTED LAPAROSCOPIC VAGINAL HYSTERECTOMY;  Surgeon: Imagene Gurney A. Alycia Rossetti, MD;  [facility]: [facility];  Service: Gynecology;  Laterality: N/A;  . Open reduction internal fixation (orif) distal phalanx Left 08/05/2012    Procedure: OPEN REDUCTION INTERNAL FIXATION (ORIF) DISTAL PHALANX VERSUS EXTERNAL FIXATION LEFT RING FINGER;  Surgeon: Tennis Must, MD;  Location: [facility];  Service: Orthopedics;  Laterality: Left;  . Abdominal hysterectomy    . Shoulder arthroscopy with rotator cuff repair and subacromial decompression Left 11/27/2013    Procedure: LEFT SHOULDER ARTHROSCOPY, SUBACROMIAL DECOMPRESSION, PARTIAL ACROMIOPLASTY WITH  CORACOACROMIAL RELEASE, DISTAL CLAVICULECTOMY WITH ROTATOR CUFF REPAIR AND EXTENSIVE DEBRIDEMENT;  Surgeon: Lorn Junes, MD;  Location: [facility];  Service: Orthopedics;  Laterality: Left;    Past Medical Hx:  Past Medical History  Diagnosis Date  . Hypothyroidism   . Hyperlipidemia   . Arthritis   . 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 History of chicken pox   . 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 Endometrial cancer ([place]) 10/2011 dx    s/p vag hyst 11/2011  . Diet-controlled type 2 diabetes mellitus ([place])   . Complex endometrial hyperplasia 2005  . Anxiety   . Rotator cuff injury     Family Hx:  Family History  Problem Relation Age of Onset  . Heart disease Brother 36    cardiomyopathy s/p ICD, on transplant list  . Cancer Father 78    prostrate  . Hypertension Father   . Arthritis Mother   . Stroke Father     in setting of sepsis f/ RMSF    Vitals:  Blood pressure 125/64, pulse 77, temperature 97.6 F (36.4 C), temperature source Oral, resp. rate  18, height 5' 2.5" (1.588 m), weight 162 lb 9.6 oz (73.755 kg), last menstrual period 05/22/2003, SpO2 98 %.  Physical Exam:  Well-nourished well-developed female in no acute distress  Neck is supple, there is no lymphadenopathy no thyromegaly  Lungs: Clear to auscultation bilaterally.  Cardiovascular: Regular rate rhythm.  Abdomen: Well-healed surgical incisions. There is no evidence of any incisional hernias. There are no masses or hepatosplenomegaly. Abdomen is nontender.  Groins: No lymphadenopathy.  Extremities: No edema.  Pelvic: Normal external female genitalia. Vagina slightly atrophic. There are no vaginal cuff lesions. Bimanual examination reveals no masses or nodularity. Rectal confirms.  Assessment/Plan: 47 -year-old diagnosed with a  stage IA grade 1 endometrial adenocarcinoma status post definitive surgery in July 2013. She has no evidence of recurrent disease. She will followup with Dr. Sabra Heck in 6 months for her annual examination of the Pap smear once a year from the time of the anniversary of her diagnosis. She'll followup with [place] in 1 year. She knows to contact us if she has been issues with bleeding pelvic or abdominal pain.  Ketan Renz A., MD 08/31/2015, 12:06 PM

## 2015-08-31 NOTE — Patient Instructions (Signed)
Follow-up with Dr. Sabra Heck in 6 months and return to see GYN oncology in one year.

## 2015-09-05 ENCOUNTER — Telehealth: Payer: Self-pay | Admitting: Obstetrics & Gynecology

## 2015-09-05 NOTE — Telephone Encounter (Signed)
Left patient a message to call back to move her AEX from [name]'s schedule to Dr. Ammie Ferrier schedule in October.  Note from Dr. Sabra Heck:  Cheryl Salon, MD  Starla Curl            Pt with hx of endometrial cancer. Just saw gyn/onc. Needs appt with me in 10/17. Currently it is scheduled with [name]. Thanks.   Vinnie Level

## 2015-09-08 NOTE — Telephone Encounter (Signed)
Left patient a message to call back and ask for Starla. °

## 2015-09-12 NOTE — Telephone Encounter (Signed)
Encounter closed by Dr. Quincy Simmonds.

## 2015-09-12 NOTE — Telephone Encounter (Signed)
Patient now scheduled for 03/08/16 with Dr. Sabra Heck for AEX. FYI only.

## 2015-09-13 ENCOUNTER — Other Ambulatory Visit: Payer: Self-pay | Admitting: Internal Medicine

## 2015-09-13 DIAGNOSIS — M542 Cervicalgia: Secondary | ICD-10-CM | POA: Diagnosis not present

## 2015-09-13 DIAGNOSIS — M25511 Pain in right shoulder: Secondary | ICD-10-CM | POA: Diagnosis not present

## 2015-09-13 MED ORDER — METFORMIN HCL ER (MOD) 500 MG PO TB24: 500.0000 mg | ORAL_TABLET | Freq: Every day | ORAL | Status: DC

## 2015-09-13 MED ORDER — EZETIMIBE 10 MG PO TABS: 10.0000 mg | ORAL_TABLET | Freq: Every day | ORAL | Status: DC

## 2015-09-14 MED FILL — EZETIMIBE 10 MG TABLET: 10 | 30 days supply | Qty: 30 | Fill #0

## 2015-09-14 MED FILL — METFORMIN HCL ER 500 MG TAB: 500 | 30 days supply | Qty: 30 | Fill #0

## 2015-09-15 ENCOUNTER — Other Ambulatory Visit: Payer: Self-pay

## 2015-09-15 DIAGNOSIS — Z1231 Encounter for screening mammogram for malignant neoplasm of breast: Secondary | ICD-10-CM

## 2015-09-20 DIAGNOSIS — M25511 Pain in right shoulder: Secondary | ICD-10-CM | POA: Diagnosis not present

## 2015-09-30 ENCOUNTER — Ambulatory Visit: Payer: 59

## 2015-10-13 ENCOUNTER — Ambulatory Visit
Admission: RE | Admit: 2015-10-13 | Discharge: 2015-10-13 | Disposition: A | Discharge status: Home / Self Care | Payer: 59 | Source: Ambulatory Visit

## 2015-10-13 DIAGNOSIS — Z1231 Encounter for screening mammogram for malignant neoplasm of breast: Secondary | ICD-10-CM

## 2015-10-19 MED FILL — METFORMIN HCL ER 500 MG TAB: 500 | 30 days supply | Qty: 30 | Fill #1

## 2015-10-26 MED FILL — SERTRALINE HCL 100 MG TAB: 100 | 90 days supply | Qty: 90 | Fill #3

## 2016-02-13 ENCOUNTER — Other Ambulatory Visit: Payer: Self-pay | Admitting: Internal Medicine

## 2016-02-14 MED FILL — SERTRALINE HCL 100 MG TAB: 100 | 90 days supply | Qty: 90 | Fill #0

## 2016-02-16 ENCOUNTER — Ambulatory Visit: Payer: 59 | Admitting: Certified Nurse Midwife

## 2016-02-22 ENCOUNTER — Encounter: Payer: 59 | Admitting: Internal Medicine

## 2016-03-08 ENCOUNTER — Encounter: Payer: Self-pay | Admitting: Obstetrics & Gynecology

## 2016-03-08 ENCOUNTER — Ambulatory Visit (INDEPENDENT_AMBULATORY_CARE_PROVIDER_SITE_OTHER): Payer: 59 | Admitting: Obstetrics & Gynecology

## 2016-03-08 VITALS — BP 138/70 | HR 76 | Resp 16 | Ht 62.0 in | Wt 168.0 lb

## 2016-03-08 DIAGNOSIS — Z124 Encounter for screening for malignant neoplasm of cervix: Secondary | ICD-10-CM

## 2016-03-08 DIAGNOSIS — Z01419 Encounter for gynecological examination (general) (routine) without abnormal findings: Secondary | ICD-10-CM

## 2016-03-08 MED ORDER — SERTRALINE HCL 100 MG PO TABS: 100.0000 mg | ORAL_TABLET | Freq: Every day | ORAL | 4 refills | Status: DC

## 2016-03-08 NOTE — Progress Notes (Signed)
60 y.o. [place] MarriedCaucasianF here for annual exam.  Doing well.  No vaginal bleeding.     Patient's last menstrual period was 05/22/2003.          Sexually active: No.  The current method of family planning is status post hysterectomy.    Exercising: Yes.    walking Smoker:  no  Health Maintenance: Pap:  02/15/15 negative  History of abnormal Pap:  Yes; Endometrial Cancer; Hysterectomy MMG:  10/14/15 BIRADS 1 negative  Colonoscopy:  2008 normal  BMD:   07/28/13 normal  TDaP:  08/03/12  Pneumonia vaccine(s):  03/30/13  Zostavax:   PCP Hep C testing: 10/16  Screening Labs: PCP, Hb today: PCP, Urine today: PCP   reports that she has never smoked. She has never used smokeless tobacco. She reports that she drinks about 0.5 - 1.0 oz of alcohol per week . She reports that she does not use drugs.  Past Medical History:  Diagnosis Date  . Anxiety   . Arthritis   . Complex endometrial hyperplasia 2005  . 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 Diet-controlled type 2 diabetes mellitus ([place])   . Endometrial cancer ([place]) 10/2011 dx   s/p vag hyst 11/2011  . History of chicken pox   . Hyperlipidemia   . Hypothyroidism   . 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 Rotator cuff injury     Past Surgical History:  Procedure Laterality Date  . ABDOMINAL HYSTERECTOMY    . CESAREAN SECTION     x's 4  . DILATION AND CURETTAGE OF UTERUS     hysteroscopy  . FRACTURE SURGERY     nose as a child  . OPEN REDUCTION INTERNAL FIXATION (ORIF) DISTAL PHALANX Left 08/05/2012   Procedure: OPEN REDUCTION INTERNAL FIXATION (ORIF) DISTAL PHALANX VERSUS EXTERNAL FIXATION LEFT RING FINGER;  Surgeon: Tennis Must, MD;  Location: [place];  Service: Orthopedics;  Laterality: Left;  . ROBOTIC ASSISTED LAP VAGINAL HYSTERECTOMY  12/04/2011   Procedure: ROBOTIC ASSISTED LAPAROSCOPIC VAGINAL HYSTERECTOMY;  Surgeon: Imagene Gurney A. Alycia Rossetti, MD;  Location: [facility];  Service: Gynecology;  Laterality: N/A;   . SHOULDER ARTHROSCOPY WITH ROTATOR CUFF REPAIR AND SUBACROMIAL DECOMPRESSION Left 11/27/2013   Procedure: LEFT SHOULDER ARTHROSCOPY, SUBACROMIAL DECOMPRESSION, PARTIAL ACROMIOPLASTY WITH  CORACOACROMIAL RELEASE, DISTAL CLAVICULECTOMY WITH ROTATOR CUFF REPAIR AND EXTENSIVE DEBRIDEMENT;  Surgeon: Lorn Junes, MD;  Location: [place];  Service: Orthopedics;  Laterality: Left;    Current Outpatient Prescriptions  Medication Sig Dispense Refill  . fluticasone (FLONASE) 50 MCG/ACT nasal spray Place 2 sprays into both nostrils daily. Must establish with NEW PCP for additional refills. 48 g 2  . sertraline (ZOLOFT) 100 MG tablet TAKE 1 TABLET (100 MG TOTAL) BY MOUTH DAILY. 90 tablet 1   No current facility-administered medications for this visit.     Family History  Problem Relation Age of Onset  . Arthritis Mother   . Cancer Father 77    prostrate  . Hypertension Father   . Stroke Father     in setting of sepsis f/ RMSF  . Heart disease Brother 45    cardiomyopathy s/p ICD, on transplant list    ROS:  Pertinent items are noted in HPI.  Otherwise, a comprehensive ROS was negative.  Exam:   BP 138/70 (BP Location: Right Arm, Patient Position: Sitting, Cuff Size: Normal)   Pulse 76   Resp 16   Ht 5\' 2"  (1.575 m)  Wt 168 lb (76.2 kg)   LMP 05/22/2003   BMI 30.73 kg/m    Height: 5\' 2"  (157.5 cm)  Ht Readings from Last 3 Encounters:  03/08/16 5\' 2"  (1.575 m)  08/31/15 5' 2.5" (1.588 m)  05/18/15 5' 2.5" (1.588 m)   General appearance: alert, cooperative and appears stated age Head: Normocephalic, without obvious abnormality, atraumatic Neck: no adenopathy, supple, symmetrical, trachea midline and thyroid normal to inspection and palpation Lungs: clear to auscultation bilaterally Breasts: normal appearance, no masses or tenderness Heart: regular rate and rhythm Abdomen: soft, non-tender; bowel sounds normal; no masses,  no organomegaly Extremities: extremities  normal, atraumatic, no cyanosis or edema Skin: Skin color, texture, turgor normal. No rashes or lesions Lymph nodes: Cervical, supraclavicular, and axillary nodes normal. No abnormal inguinal nodes palpated Neurologic: Grossly normal   Pelvic: External genitalia:  no lesions              Urethra:  normal appearing urethra with no masses, tenderness or lesions              Bartholins and Skenes: normal                 Vagina: normal appearing vagina with normal color and discharge, no lesions              Cervix: absent              Pap taken: Yes.   Bimanual Exam:  Uterus:  uterus absent              Adnexa: normal adnexa and no mass, fullness, tenderness               Rectovaginal: Confirms               Anus:  normal sphincter tone, no lesions  Chaperone was present for exam.  A:   Well Woman with normal exam Menopausal no HRT S/P TLH/BSO 9/13 for grade 1 A endometrial adeno cancer Hypothyroidism Type II Diabetes  P:         Mammogram guidelines reviewed Rx for Zoloft 100mg  daily.  #90/4RF. Pap smear as above with HPV reflex AEX 1 year or follow up prn

## 2016-03-12 LAB — IPS PAP TEST WITH REFLEX TO HPV

## 2016-03-23 ENCOUNTER — Encounter: Payer: Self-pay | Admitting: Internal Medicine

## 2016-03-23 ENCOUNTER — Ambulatory Visit (INDEPENDENT_AMBULATORY_CARE_PROVIDER_SITE_OTHER): Payer: 59 | Admitting: Internal Medicine

## 2016-03-23 VITALS — BP 120/70 | HR 90 | Temp 98.3°F | Ht 62.0 in | Wt 168.0 lb

## 2016-03-23 DIAGNOSIS — Z1211 Encounter for screening for malignant neoplasm of colon: Secondary | ICD-10-CM | POA: Diagnosis not present

## 2016-03-23 DIAGNOSIS — C55 Malignant neoplasm of uterus, part unspecified: Secondary | ICD-10-CM

## 2016-03-23 DIAGNOSIS — F329 Major depressive disorder, single episode, unspecified: Secondary | ICD-10-CM | POA: Diagnosis not present

## 2016-03-23 DIAGNOSIS — E663 Overweight: Secondary | ICD-10-CM

## 2016-03-23 DIAGNOSIS — E7800 Pure hypercholesterolemia, unspecified: Secondary | ICD-10-CM

## 2016-03-23 DIAGNOSIS — Z23 Encounter for immunization: Secondary | ICD-10-CM

## 2016-03-23 DIAGNOSIS — Z0000 Encounter for general adult medical examination without abnormal findings: Secondary | ICD-10-CM | POA: Diagnosis not present

## 2016-03-23 DIAGNOSIS — E119 Type 2 diabetes mellitus without complications: Secondary | ICD-10-CM

## 2016-03-23 DIAGNOSIS — E039 Hypothyroidism, unspecified: Secondary | ICD-10-CM

## 2016-03-23 DIAGNOSIS — F32A Depression, unspecified: Secondary | ICD-10-CM

## 2016-03-23 NOTE — Assessment & Plan Note (Signed)
Stressed weight loss - decreased portions, healthy diet, and exercise

## 2016-03-23 NOTE — Assessment & Plan Note (Signed)
Stopped medication Check tsh - restart medication if needed

## 2016-03-23 NOTE — Progress Notes (Signed)
Subjective:    Patient ID: Cheryl Chan, female    DOB: 1955-08-12, 60 y.o.   MRN: [id number]  HPI She is here for a physical exam.    She is concerned about her weight.    She has gotten of medication for her diabetes because she has wanted to make lifstyle changes, but she has not been able to do that.  She thinks her sugars will be higher.   Medications and allergies reviewed with patient and updated if appropriate.  Patient Active Problem List   Diagnosis Date Noted  . Rash and nonspecific skin eruption 08/22/2015  . Overweight 01/25/2015  . Rotator cuff injury   . Uterine cancer ([place]) 11/12/2011  . Diet-controlled type 2 diabetes mellitus ([place])   . Hypothyroidism   . Hyperlipidemia   . Depression     Current Outpatient Prescriptions on File Prior to Visit  Medication Sig Dispense Refill  . fluticasone (FLONASE) 50 MCG/ACT nasal spray Place 2 sprays into both nostrils daily. Must establish with NEW PCP for additional refills. 48 g 2  . sertraline (ZOLOFT) 100 MG tablet Take 1 tablet (100 mg total) by mouth daily. 90 tablet 4   No current facility-administered medications on file prior to visit.     Past Medical History:  Diagnosis Date  . Anxiety   . Arthritis   . Complex endometrial hyperplasia 2005  . 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 Diet-controlled type 2 diabetes mellitus ([place])   . Endometrial cancer ([place]) 10/2011 dx   s/p vag hyst 11/2011  . History of chicken pox   . Hyperlipidemia   . Hypothyroidism   . 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 Rotator cuff injury     Past Surgical History:  Procedure Laterality Date  . ABDOMINAL HYSTERECTOMY    . CESAREAN SECTION     x's 4  . DILATION AND CURETTAGE OF UTERUS     hysteroscopy  . FRACTURE SURGERY     nose as a child  . OPEN REDUCTION INTERNAL FIXATION (ORIF) DISTAL PHALANX Left 08/05/2012   Procedure: OPEN REDUCTION INTERNAL FIXATION (ORIF) DISTAL PHALANX VERSUS EXTERNAL FIXATION  LEFT RING FINGER;  Surgeon: Tennis Must, MD;  Location: [place];  Service: Orthopedics;  Laterality: Left;  . ROBOTIC ASSISTED LAP VAGINAL HYSTERECTOMY  12/04/2011   Procedure: ROBOTIC ASSISTED LAPAROSCOPIC VAGINAL HYSTERECTOMY;  Surgeon: Imagene Gurney A. Alycia Rossetti, MD;  Location: [facility];  Service: Gynecology;  Laterality: N/A;  . SHOULDER ARTHROSCOPY WITH ROTATOR CUFF REPAIR AND SUBACROMIAL DECOMPRESSION Left 11/27/2013   Procedure: LEFT SHOULDER ARTHROSCOPY, SUBACROMIAL DECOMPRESSION, PARTIAL ACROMIOPLASTY WITH  CORACOACROMIAL RELEASE, DISTAL CLAVICULECTOMY WITH ROTATOR CUFF REPAIR AND EXTENSIVE DEBRIDEMENT;  Surgeon: Lorn Junes, MD;  Location: [place];  Service: Orthopedics;  Laterality: Left;    Social History   Social History  . Marital status: Married    Spouse name: N/A  . Number of children: N/A  . Years of education: N/A   Social History Main Topics  . Smoking status: Never Smoker  . Smokeless tobacco: Never Used  . Alcohol use 0.5 - 1.0 oz/week    1 - 2 drink(s) per week     Comment: social  . Drug use: No  . Sexual activity: No     Comment: hysterectomy   Other Topics Concern  . 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 Arthritis Mother   . Cancer Father 60    prostrate  . Hypertension Father   . Stroke Father     in setting of sepsis f/ RMSF  . Heart disease Brother 68    cardiomyopathy s/p ICD, on transplant list    Review of Systems  Constitutional: Negative for appetite change, chills, fatigue and fever.  Eyes: Negative for visual disturbance.  Respiratory: Negative for cough, shortness of breath and wheezing.   Cardiovascular: Negative for chest pain, palpitations and leg swelling.    Gastrointestinal: Negative for abdominal pain, blood in stool, constipation, diarrhea (soft stools) and nausea.       No gerd  Genitourinary: Negative for dysuria and hematuria.  Musculoskeletal: Positive for arthralgias (hands, knees, hips).  Skin: Negative for color change and rash.  Neurological: Negative for light-headedness and headaches.  Psychiatric/Behavioral: Positive for dysphoric mood (controlled). The patient is not nervous/anxious.        Objective:   Vitals:   03/23/16 1406  BP: 120/70  Pulse: 90  Temp: 98.3 F (36.8 C)   Filed Weights   03/23/16 1406  Weight: 168 lb (76.2 kg)   Body mass index is 30.73 kg/m.   Physical Exam Constitutional: She appears well-developed and well-nourished. No distress.  HENT:  Head: Normocephalic and atraumatic.  Right Ear: External ear normal. Normal ear canal and TM Left Ear: External ear normal.  Normal ear canal and TM Mouth/Throat: Oropharynx is clear and moist.  Eyes: Conjunctivae and EOM are normal.  Neck: Neck supple. No tracheal deviation present. No thyromegaly present.  No carotid bruit  Cardiovascular: Normal rate, regular rhythm and normal heart sounds.   No murmur heard.  No edema. Pulmonary/Chest: Effort normal and breath sounds normal. No respiratory distress. She has no wheezes. She has no rales.  Breast: deferred to Gyn Abdominal: Soft. She exhibits no distension. There is no tenderness.  Lymphadenopathy: She has no cervical adenopathy.  Skin: Skin is warm and dry. She is not diaphoretic.  Psychiatric: She has a normal mood and affect. Her behavior is normal.       Assessment & Plan:   Physical exam: Screening blood work ordered Immunizations flu vaccine today Colonoscopy - due - will refer -- Up to date  Mammogram  Up to date  Gyn Up to date  Dexa  Up to date  Eye exams - due - will schedule Exercise - walking dog - advised her to increase exercise Weight - work on weight loss Skin  - no  concerns Substance abuse - none  See Problem List for Assessment and Plan of chronic medical problems.

## 2016-03-23 NOTE — Assessment & Plan Note (Signed)
Can consider pravastatin or zetia Will check lipids, cmp

## 2016-03-23 NOTE — Assessment & Plan Note (Addendum)
Not compliant with low sugar/carb diet Walking dog for exercise Check a1c, urine micro Has not been successful with diet and lifestyle changes Will check a1c - will need to start medication - metformin and she agrees to start it - will wait for results Stressed regular exercise, low sugar/carb diet and weight loss Follow up in 3 months

## 2016-03-23 NOTE — Assessment & Plan Note (Signed)
Controlled, stable Continue current dose of medication  

## 2016-03-23 NOTE — Assessment & Plan Note (Signed)
Following with gyn

## 2016-03-23 NOTE — Patient Instructions (Addendum)
Test(s) ordered today. Your results will be released to [place] (or called to you) after review, usually within 72hours after test completion. If any changes need to be made, you will be notified at that same time.  All other Health Maintenance issues reviewed.   All recommended immunizations and age-appropriate screenings are up-to-date or discussed.  flu immunization administered today.   Medications reviewed and updated.  No changes recommended at this time.   A referral ws ordered for GI  Please followup in 3 months  Health Maintenance, Female Adopting a healthy lifestyle and getting preventive care can go a long way to promote health and wellness. Talk with your health care provider about what schedule of regular examinations is right for you. This is a good chance for you to check in with your provider about disease prevention and staying healthy. In between checkups, there are plenty of things you can do on your own. Experts have done a lot of research about which lifestyle changes and preventive measures are most likely to keep you healthy. Ask your health care provider for more information. WEIGHT AND DIET  Eat a healthy diet  Be sure to include plenty of vegetables, fruits, low-fat dairy products, and lean protein.  Do not eat a lot of foods high in solid fats, added sugars, or salt.  Get regular exercise. This is one of the most important things you can do for your health.  Most adults should exercise for at least 150 minutes each week. The exercise should increase your heart rate and make you sweat (moderate-intensity exercise).  Most adults should also do strengthening exercises at least twice a week. This is in addition to the moderate-intensity exercise.  Maintain a healthy weight  Body mass index (BMI) is a measurement that can be used to identify possible weight problems. It estimates body fat based on height and weight. Your health care provider can help determine  your BMI and help you achieve or maintain a healthy weight.  For females 27 years of age and older:   A BMI below 18.5 is considered underweight.  A BMI of 18.5 to 24.9 is normal.  A BMI of 25 to 29.9 is considered overweight.  A BMI of 30 and above is considered obese.  Watch levels of cholesterol and blood lipids  You should start having your blood tested for lipids and cholesterol at 60 years of age, then have this test every 5 years.  You may need to have your cholesterol levels checked more often if:  Your lipid or cholesterol levels are high.  You are older than 61 years of age.  You are at high risk for heart disease.  CANCER SCREENING   Lung Cancer  Lung cancer screening is recommended for adults 42-31 years old who are at high risk for lung cancer because of a history of smoking.  A yearly low-dose CT scan of the lungs is recommended for people who:  Currently smoke.  Have quit within the past 15 years.  Have at least a 30-pack-year history of smoking. A pack year is smoking an average of one pack of cigarettes a day for 1 year.  Yearly screening should continue until it has been 15 years since you quit.  Yearly screening should stop if you develop a health problem that would prevent you from having lung cancer treatment.  Breast Cancer  Practice breast self-awareness. This means understanding how your breasts normally appear and feel.  It also means doing regular breast  self-exams. Let your health care provider know about any changes, no matter how small.  If you are in your 20s or 30s, you should have a clinical breast exam (CBE) by a health care provider every 1-3 years as part of a regular health exam.  If you are 90 or older, have a CBE every year. Also consider having a breast X-ray (mammogram) every year.  If you have a family history of breast cancer, talk to your health care provider about genetic screening.  If you are at high risk for breast  cancer, talk to your health care provider about having an MRI and a mammogram every year.  Breast cancer gene (BRCA) assessment is recommended for women who have family members with BRCA-related cancers. BRCA-related cancers include:  Breast.  Ovarian.  Tubal.  Peritoneal cancers.  Results of the assessment will determine the need for genetic counseling and BRCA1 and BRCA2 testing. Cervical Cancer Your health care provider may recommend that you be screened regularly for cancer of the pelvic organs (ovaries, uterus, and vagina). This screening involves a pelvic examination, including checking for microscopic changes to the surface of your cervix (Pap test). You may be encouraged to have this screening done every 3 years, beginning at age 28.  For women ages 48-65, health care providers may recommend pelvic exams and Pap testing every 3 years, or they may recommend the Pap and pelvic exam, combined with testing for human papilloma virus (HPV), every 5 years. Some types of HPV increase your risk of cervical cancer. Testing for HPV may also be done on women of any age with unclear Pap test results.  Other health care providers may not recommend any screening for nonpregnant women who are considered low risk for pelvic cancer and who do not have symptoms. Ask your health care provider if a screening pelvic exam is right for you.  If you have had past treatment for cervical cancer or a condition that could lead to cancer, you need Pap tests and screening for cancer for at least 20 years after your treatment. If Pap tests have been discontinued, your risk factors (such as having a new sexual partner) need to be reassessed to determine if screening should resume. Some women have medical problems that increase the chance of getting cervical cancer. In these cases, your health care provider may recommend more frequent screening and Pap tests. Colorectal Cancer  This type of cancer can be detected and  often prevented.  Routine colorectal cancer screening usually begins at 60 years of age and continues through 60 years of age.  Your health care provider may recommend screening at an earlier age if you have risk factors for colon cancer.  Your health care provider may also recommend using home test kits to check for hidden blood in the stool.  A small camera at the end of a tube can be used to examine your colon directly (sigmoidoscopy or colonoscopy). This is done to check for the earliest forms of colorectal cancer.  Routine screening usually begins at age 39.  Direct examination of the colon should be repeated every 5-10 years through 60 years of age. However, you may need to be screened more often if early forms of precancerous polyps or small growths are found. Skin Cancer  Check your skin from head to toe regularly.  Tell your health care provider about any new moles or changes in moles, especially if there is a change in a mole's shape or color.  Also  tell your health care provider if you have a mole that is larger than the size of a pencil eraser.  Always use sunscreen. Apply sunscreen liberally and repeatedly throughout the day.  Protect yourself by wearing long sleeves, pants, a wide-brimmed hat, and sunglasses whenever you are outside. HEART DISEASE, DIABETES, AND HIGH BLOOD PRESSURE   High blood pressure causes heart disease and increases the risk of stroke. High blood pressure is more likely to develop in:  People who have blood pressure in the high end of the normal range (130-139/85-89 mm Hg).  People who are overweight or obese.  People who are African American.  If you are 60-67 years of age, have your blood pressure checked every 3-5 years. If you are 57 years of age or older, have your blood pressure checked every year. You should have your blood pressure measured twice--once when you are at a hospital or clinic, and once when you are not at a hospital or clinic.  Record the average of the two measurements. To check your blood pressure when you are not at a hospital or clinic, you can use:  An automated blood pressure machine at a pharmacy.  A home blood pressure monitor.  If you are between 33 years and 50 years old, ask your health care provider if you should take aspirin to prevent strokes.  Have regular diabetes screenings. This involves taking a blood sample to check your fasting blood sugar level.  If you are at a normal weight and have a low risk for diabetes, have this test once every three years after 60 years of age.  If you are overweight and have a high risk for diabetes, consider being tested at a younger age or more often. PREVENTING INFECTION  Hepatitis B  If you have a higher risk for hepatitis B, you should be screened for this virus. You are considered at high risk for hepatitis B if:  You were born in a country where hepatitis B is common. Ask your health care provider which countries are considered high risk.  Your parents were born in a high-risk country, and you have not been immunized against hepatitis B (hepatitis B vaccine).  You have HIV or AIDS.  You use needles to inject street drugs.  You live with someone who has hepatitis B.  You have had sex with someone who has hepatitis B.  You get hemodialysis treatment.  You take certain medicines for conditions, including cancer, organ transplantation, and autoimmune conditions. Hepatitis C  Blood testing is recommended for:  Everyone born from [birth year] through 1965.  Anyone with known risk factors for hepatitis C. Sexually transmitted infections (STIs)  You should be screened for sexually transmitted infections (STIs) including gonorrhea and chlamydia if:  You are sexually active and are younger than 60 years of age.  You are older than 60 years of age and your health care provider tells you that you are at risk for this type of infection.  Your sexual activity  has changed since you were last screened and you are at an increased risk for chlamydia or gonorrhea. Ask your health care provider if you are at risk.  If you do not have HIV, but are at risk, it may be recommended that you take a prescription medicine daily to prevent HIV infection. This is called pre-exposure prophylaxis (PrEP). You are considered at risk if:  You are sexually active and do not regularly use condoms or know the HIV status of your partner(s).  You take drugs by injection.  You are sexually active with a partner who has HIV. Talk with your health care provider about whether you are at high risk of being infected with HIV. If you choose to begin PrEP, you should first be tested for HIV. You should then be tested every 3 months for as long as you are taking PrEP.  PREGNANCY   If you are premenopausal and you may become pregnant, ask your health care provider about preconception counseling.  If you may become pregnant, take 400 to 800 micrograms (mcg) of folic acid every day.  If you want to prevent pregnancy, talk to your health care provider about birth control (contraception). OSTEOPOROSIS AND MENOPAUSE   Osteoporosis is a disease in which the bones lose minerals and strength with aging. This can result in serious bone fractures. Your risk for osteoporosis can be identified using a bone density scan.  If you are 65 years of age or older, or if you are at risk for osteoporosis and fractures, ask your health care provider if you should be screened.  Ask your health care provider whether you should take a calcium or vitamin D supplement to lower your risk for osteoporosis.  Menopause may have certain physical symptoms and risks.  Hormone replacement therapy may reduce some of these symptoms and risks. Talk to your health care provider about whether hormone replacement therapy is right for you.  HOME CARE INSTRUCTIONS   Schedule regular health, dental, and eye  exams.  Stay current with your immunizations.   Do not use any tobacco products including cigarettes, chewing tobacco, or electronic cigarettes.  If you are pregnant, do not drink alcohol.  If you are breastfeeding, limit how much and how often you drink alcohol.  Limit alcohol intake to no more than 1 drink per day for nonpregnant women. One drink equals 12 ounces of beer, 5 ounces of wine, or 1 ounces of hard liquor.  Do not use street drugs.  Do not share needles.  Ask your health care provider for help if you need support or information about quitting drugs.  Tell your health care provider if you often feel depressed.  Tell your health care provider if you have ever been abused or do not feel safe at home.   This information is not intended to replace advice given to you by your health care provider. Make sure you discuss any questions you have with your health care provider.   Document Released: 11/20/2010 Document Revised: 05/28/2014 Document Reviewed: 04/08/2013 Elsevier Interactive Patient Education 2016 Elsevier Inc.  

## 2016-03-28 ENCOUNTER — Other Ambulatory Visit (INDEPENDENT_AMBULATORY_CARE_PROVIDER_SITE_OTHER): Payer: 59

## 2016-03-28 DIAGNOSIS — Z0000 Encounter for general adult medical examination without abnormal findings: Secondary | ICD-10-CM

## 2016-03-28 DIAGNOSIS — E119 Type 2 diabetes mellitus without complications: Secondary | ICD-10-CM

## 2016-03-28 LAB — CBC WITH DIFFERENTIAL/PLATELET
BASOS PCT: 0.6 % (ref 0.0–3.0)
Basophils Absolute: 0 10*3/uL (ref 0.0–0.1)
EOS PCT: 1.5 % (ref 0.0–5.0)
Eosinophils Absolute: 0.1 10*3/uL (ref 0.0–0.7)
HEMATOCRIT: 39.2 % (ref 36.0–46.0)
HEMOGLOBIN: 13.5 g/dL (ref 12.0–15.0)
LYMPHS PCT: 35.5 % (ref 12.0–46.0)
Lymphs Abs: 1.8 10*3/uL (ref 0.7–4.0)
MCHC: 34.5 g/dL (ref 30.0–36.0)
MCV: 86.6 fl (ref 78.0–100.0)
MONO ABS: 0.5 10*3/uL (ref 0.1–1.0)
Monocytes Relative: 10.8 % (ref 3.0–12.0)
Neutro Abs: 2.5 10*3/uL (ref 1.4–7.7)
Neutrophils Relative %: 51.6 % (ref 43.0–77.0)
Platelets: 308 10*3/uL (ref 150.0–400.0)
RBC: 4.53 Mil/uL (ref 3.87–5.11)
RDW: 14.1 % (ref 11.5–15.5)
WBC: 4.9 10*3/uL (ref 4.0–10.5)

## 2016-03-28 LAB — COMPREHENSIVE METABOLIC PANEL
ALT: 12 U/L (ref 0–35)
AST: 15 U/L (ref 0–37)
Albumin: 4.2 g/dL (ref 3.5–5.2)
Alkaline Phosphatase: 69 U/L (ref 39–117)
BUN: 23 mg/dL (ref 6–23)
CO2: 30 mEq/L (ref 19–32)
Calcium: 9.6 mg/dL (ref 8.4–10.5)
Chloride: 102 mEq/L (ref 96–112)
Creatinine, Ser: 0.64 mg/dL (ref 0.40–1.20)
GFR: 100.65 mL/min (ref >60.00)
Glucose, Bld: 151 mg/dL — ABNORMAL HIGH (ref 70–99)
Potassium: 4.3 mEq/L (ref 3.5–5.1)
Sodium: 138 mEq/L (ref 135–145)
Total Bilirubin: 0.6 mg/dL (ref 0.2–1.2)
Total Protein: 7.4 g/dL (ref 6.0–8.3)

## 2016-03-28 LAB — LIPID PANEL
CHOLESTEROL: 313 mg/dL — AB (ref 0–200)
HDL: 57.9 mg/dL (ref >39.00)
LDL Cholesterol: 235 mg/dL — ABNORMAL HIGH (ref 0–99)
NonHDL: 255.3
Total CHOL/HDL Ratio: 5
Triglycerides: 104 mg/dL (ref 0.0–149.0)
VLDL: 20.8 mg/dL (ref 0.0–40.0)

## 2016-03-28 LAB — MICROALBUMIN / CREATININE URINE RATIO
Creatinine,U: 111.7 mg/dL
Microalb Creat Ratio: 0.6 mg/g (ref 0.0–30.0)
Microalb, Ur: <0.7 mg/dL (ref 0.0–1.9)

## 2016-03-28 LAB — HEMOGLOBIN A1C: Hgb A1c MFr Bld: 8.1 % — ABNORMAL HIGH (ref 4.6–6.5)

## 2016-03-28 LAB — TSH: TSH: 9.89 u[IU]/mL — ABNORMAL HIGH (ref 0.35–4.50)

## 2016-03-28 LAB — VITAMIN D 25 HYDROXY (VIT D DEFICIENCY, FRACTURES): VITD: 26.84 ng/mL — ABNORMAL LOW (ref 30.00–100.00)

## 2016-04-06 ENCOUNTER — Encounter: Payer: Self-pay | Admitting: Emergency Medicine

## 2016-04-09 ENCOUNTER — Telehealth: Payer: Self-pay | Admitting: Emergency Medicine

## 2016-04-09 MED ORDER — METFORMIN HCL 500 MG PO TABS: 500.0000 mg | ORAL_TABLET | Freq: Two times a day (BID) | ORAL | 1 refills | Status: DC

## 2016-04-09 MED ORDER — LEVOTHYROXINE SODIUM 25 MCG PO CAPS
25.0000 ug | ORAL_CAPSULE | Freq: Every day | ORAL | 2 refills | Status: DC
Start: 2016-04-09 — End: 2016-07-04

## 2016-04-09 MED FILL — LEVOTHYROXINE 25 MCG TABLET: 25 | 30 days supply | Qty: 30 | Fill #0

## 2016-04-09 MED FILL — metFORMIN HCL 500 MG TABS: 500 | 90 days supply | Qty: 180 | Fill #0

## 2016-04-09 NOTE — Telephone Encounter (Signed)
RX sent to [facility] 

## 2016-04-09 NOTE — Telephone Encounter (Signed)
Metformin has been sent in, what dose of levothyroxine do you want sent in?

## 2016-04-09 NOTE — Telephone Encounter (Signed)
Pt wants to know if you can give her a call back about her medications. She has a few questions about them. Please advise thanks.

## 2016-04-09 NOTE — Telephone Encounter (Signed)
Levothyroxine 25 mcg daily  We check her blood work at her next visit

## 2016-04-18 DIAGNOSIS — M19041 Primary osteoarthritis, right hand: Secondary | ICD-10-CM | POA: Diagnosis not present

## 2016-04-18 DIAGNOSIS — M542 Cervicalgia: Secondary | ICD-10-CM | POA: Diagnosis not present

## 2016-04-18 DIAGNOSIS — M5412 Radiculopathy, cervical region: Secondary | ICD-10-CM | POA: Diagnosis not present

## 2016-04-20 ENCOUNTER — Other Ambulatory Visit: Payer: Self-pay | Admitting: Emergency Medicine

## 2016-04-20 MED ORDER — GLUCOSE BLOOD VI STRP: ORAL_STRIP | 12 refills | Status: DC

## 2016-04-20 MED ORDER — FREESTYLE LANCETS MISC: 12 refills | Status: DC

## 2016-04-20 MED ORDER — FLUTICASONE PROPIONATE 50 MCG/ACT NA SUSP: 2.0000 | Freq: Every day | NASAL | 0 refills | Status: DC

## 2016-04-20 MED FILL — TRUEplus LANCETS 30G MISC: 50 days supply | Qty: 100 | Fill #0

## 2016-04-20 MED FILL — FLUTICASONE PROP 50 MCG SPR: 50 | 90 days supply | Qty: 48 | Fill #0

## 2016-04-20 MED FILL — TRUE METRIX GLUCOSE TEST ST: 50 days supply | Qty: 100 | Fill #0

## 2016-04-30 DIAGNOSIS — M5412 Radiculopathy, cervical region: Secondary | ICD-10-CM | POA: Diagnosis not present

## 2016-05-02 ENCOUNTER — Other Ambulatory Visit (HOSPITAL_COMMUNITY): Payer: Self-pay | Admitting: Orthopedic Surgery

## 2016-05-02 DIAGNOSIS — M5412 Radiculopathy, cervical region: Secondary | ICD-10-CM

## 2016-05-09 ENCOUNTER — Ambulatory Visit (HOSPITAL_COMMUNITY): Admission: RE | Admit: 2016-05-09 | Payer: 59 | Source: Ambulatory Visit

## 2016-05-09 ENCOUNTER — Ambulatory Visit (HOSPITAL_COMMUNITY)
Admission: RE | Admit: 2016-05-09 | Discharge: 2016-05-09 | Disposition: A | Discharge status: Home / Self Care | Payer: 59 | Source: Ambulatory Visit | Attending: Orthopedic Surgery | Admitting: Orthopedic Surgery

## 2016-05-09 DIAGNOSIS — M4802 Spinal stenosis, cervical region: Secondary | ICD-10-CM | POA: Insufficient documentation

## 2016-05-09 DIAGNOSIS — M2578 Osteophyte, vertebrae: Secondary | ICD-10-CM | POA: Insufficient documentation

## 2016-05-09 DIAGNOSIS — M50222 Other cervical disc displacement at C5-C6 level: Secondary | ICD-10-CM | POA: Diagnosis not present

## 2016-05-09 DIAGNOSIS — M5412 Radiculopathy, cervical region: Secondary | ICD-10-CM | POA: Insufficient documentation

## 2016-05-09 DIAGNOSIS — M50223 Other cervical disc displacement at C6-C7 level: Secondary | ICD-10-CM | POA: Insufficient documentation

## 2016-05-11 MED FILL — LEVOTHYROXINE 25 MCG TABLET: 25 | 60 days supply | Qty: 60 | Fill #1

## 2016-05-17 DIAGNOSIS — E119 Type 2 diabetes mellitus without complications: Secondary | ICD-10-CM | POA: Diagnosis not present

## 2016-05-17 DIAGNOSIS — H524 Presbyopia: Secondary | ICD-10-CM | POA: Diagnosis not present

## 2016-05-17 DIAGNOSIS — H25813 Combined forms of age-related cataract, bilateral: Secondary | ICD-10-CM | POA: Diagnosis not present

## 2016-05-17 DIAGNOSIS — H5213 Myopia, bilateral: Secondary | ICD-10-CM | POA: Diagnosis not present

## 2016-05-17 DIAGNOSIS — H52223 Regular astigmatism, bilateral: Secondary | ICD-10-CM | POA: Diagnosis not present

## 2016-05-17 DIAGNOSIS — Z7984 Long term (current) use of oral hypoglycemic drugs: Secondary | ICD-10-CM | POA: Diagnosis not present

## 2016-05-17 LAB — HM DIABETES EYE EXAM

## 2016-05-18 DIAGNOSIS — M542 Cervicalgia: Secondary | ICD-10-CM | POA: Diagnosis not present

## 2016-05-25 MED FILL — SERTRALINE HCL 100 MG TAB: 100 | 90 days supply | Qty: 90 | Fill #0

## 2016-06-05 ENCOUNTER — Encounter: Payer: Self-pay | Admitting: Geriatric Medicine

## 2016-06-05 NOTE — Progress Notes (Signed)
Abstracted and sent to scan  

## 2016-06-27 ENCOUNTER — Ambulatory Visit (INDEPENDENT_AMBULATORY_CARE_PROVIDER_SITE_OTHER): Payer: 59 | Admitting: Internal Medicine

## 2016-06-27 ENCOUNTER — Encounter: Payer: Self-pay | Admitting: Internal Medicine

## 2016-06-27 ENCOUNTER — Other Ambulatory Visit (INDEPENDENT_AMBULATORY_CARE_PROVIDER_SITE_OTHER): Payer: 59

## 2016-06-27 VITALS — BP 134/86 | HR 74 | Temp 98.4°F | Resp 16 | Wt 166.0 lb

## 2016-06-27 DIAGNOSIS — F329 Major depressive disorder, single episode, unspecified: Secondary | ICD-10-CM

## 2016-06-27 DIAGNOSIS — E039 Hypothyroidism, unspecified: Secondary | ICD-10-CM

## 2016-06-27 DIAGNOSIS — E7800 Pure hypercholesterolemia, unspecified: Secondary | ICD-10-CM | POA: Diagnosis not present

## 2016-06-27 DIAGNOSIS — E119 Type 2 diabetes mellitus without complications: Secondary | ICD-10-CM

## 2016-06-27 DIAGNOSIS — F32A Depression, unspecified: Secondary | ICD-10-CM

## 2016-06-27 LAB — LIPID PANEL
CHOLESTEROL: 276 mg/dL — AB (ref 0–200)
HDL: 57 mg/dL (ref >39.00)
LDL Cholesterol: 197 mg/dL — ABNORMAL HIGH (ref 0–99)
NONHDL: 218.61
Total CHOL/HDL Ratio: 5
Triglycerides: 107 mg/dL (ref 0.0–149.0)
VLDL: 21.4 mg/dL (ref 0.0–40.0)

## 2016-06-27 LAB — COMPREHENSIVE METABOLIC PANEL
ALBUMIN: 4.3 g/dL (ref 3.5–5.2)
ALK PHOS: 62 U/L (ref 39–117)
ALT: 15 U/L (ref 0–35)
AST: 17 U/L (ref 0–37)
BILIRUBIN TOTAL: 0.5 mg/dL (ref 0.2–1.2)
BUN: 13 mg/dL (ref 6–23)
CALCIUM: 9.3 mg/dL (ref 8.4–10.5)
CO2: 31 mEq/L (ref 19–32)
CREATININE: 0.57 mg/dL (ref 0.40–1.20)
Chloride: 104 mEq/L (ref 96–112)
GFR: 114.95 mL/min (ref >60.00)
Glucose, Bld: 146 mg/dL — ABNORMAL HIGH (ref 70–99)
Potassium: 4.1 mEq/L (ref 3.5–5.1)
SODIUM: 141 meq/L (ref 135–145)
TOTAL PROTEIN: 7.3 g/dL (ref 6.0–8.3)

## 2016-06-27 LAB — HEMOGLOBIN A1C: Hgb A1c MFr Bld: 7.7 % — ABNORMAL HIGH (ref 4.6–6.5)

## 2016-06-27 LAB — TSH: TSH: 6.39 u[IU]/mL — AB (ref 0.35–4.50)

## 2016-06-27 MED ORDER — INSULIN PEN NEEDLE 32G X 6 MM MISC: 3 refills | Status: DC

## 2016-06-27 MED ORDER — LIRAGLUTIDE 18 MG/3ML ~~LOC~~ SOPN: PEN_INJECTOR | SUBCUTANEOUS | 3 refills | Status: DC

## 2016-06-27 MED FILL — VICTOZA 18 MG/3 ML INJECT P: 18 | 30 days supply | Qty: 9 | Fill #0

## 2016-06-27 NOTE — Progress Notes (Signed)
Pre visit review using our clinic review tool, if applicable. No additional management support is needed unless otherwise documented below in the visit note. 

## 2016-06-27 NOTE — Assessment & Plan Note (Signed)
Metformin causing diarrhea - will discontinue Start victoza Monitor sugars  Increase exercise Work on weight loss a1c today  Fu in 3 months

## 2016-06-27 NOTE — Patient Instructions (Addendum)
Consider Livalo (statin) or Zetia for your cholesterol.     Test(s) ordered today. Your results will be released to [facility] (or called to you) after review, usually within 72hours after test completion. If any changes need to be made, you will be notified at that same time.      Medications reviewed and updated.  Changes include stopping metformin and starting victoza.   Your prescription(s) have been submitted to your pharmacy. Please take as directed and contact our office if you believe you are having problem(s) with the medication(s).   Please followup in 3 months

## 2016-06-27 NOTE — Assessment & Plan Note (Signed)
Controlled, stable Continue current dose of medication  

## 2016-06-27 NOTE — Assessment & Plan Note (Signed)
Check lipid panel Increase exercise, work on weight loss Consider livalo or zetia

## 2016-06-27 NOTE — Assessment & Plan Note (Signed)
Check tsh  Titrate med dose if needed  

## 2016-06-27 NOTE — Progress Notes (Signed)
Subjective:    Patient ID: Cheryl Chan, female    DOB: 1955-07-24, 61 y.o.   MRN: [id number]  HPI She is here for follow up.  Diabetes: She is taking her medication daily as prescribed. She is having nausea and diarrhea from the metformin.  She is compliant with a diabetic diet. She is exercising regularly - walking regularly. She wants to try to do more exercise.  She does monitor her sugars - fasting sugars 140-160. She checks her feet daily and denies foot lesions. She is up-to-date with an ophthalmology examination.   Hypothyroidism:  She is taking her medication daily.  She denies any recent changes in energy or weight that are unexplained.   Depression: She is taking her medication daily as prescribed. She denies any side effects from the medication. She feels her depression is well controlled and she is happy with her current dose of medication.     Medications and allergies reviewed with patient and updated if appropriate.  Patient Active Problem List   Diagnosis Date Noted  . Rash and nonspecific skin eruption 08/22/2015  . Overweight 01/25/2015  . Rotator cuff injury   . Uterine cancer ([place]) 11/12/2011  . Diet-controlled type 2 diabetes mellitus ([place])   . Hypothyroidism   . Hyperlipidemia   . Depression     Current Outpatient Prescriptions on File Prior to Visit  Medication Sig Dispense Refill  . fluticasone (FLONASE) 50 MCG/ACT nasal spray Place 2 sprays into both nostrils daily. 48 g 0  . glucose blood (FREESTYLE LITE) test strip Use as instructed 100 each 12  . Lancets (FREESTYLE) lancets Use as instructed 100 each 12  . Levothyroxine Sodium 25 MCG CAPS Take 1 capsule (25 mcg total) by mouth daily before breakfast. 30 capsule 2  . metFORMIN (GLUCOPHAGE) 500 MG tablet Take 1 tablet (500 mg total) by mouth 2 (two) times daily with a meal. 180 tablet 1  . sertraline (ZOLOFT) 100 MG tablet Take 1 tablet (100 mg total) by mouth daily. 90 tablet 4   No current  facility-administered medications on file prior to visit.     Past Medical History:  Diagnosis Date  . Anxiety   . Arthritis   . Complex endometrial hyperplasia 2005  . 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 Diet-controlled type 2 diabetes mellitus ([place])   . Endometrial cancer ([place]) 10/2011 dx   s/p vag hyst 11/2011  . History of chicken pox   . Hyperlipidemia   . Hypothyroidism   . 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 Rotator cuff injury     Past Surgical History:  Procedure Laterality Date  . ABDOMINAL HYSTERECTOMY    . CESAREAN SECTION     x's 4  . DILATION AND CURETTAGE OF UTERUS     hysteroscopy  . FRACTURE SURGERY     nose as a child  . OPEN REDUCTION INTERNAL FIXATION (ORIF) DISTAL PHALANX Left 08/05/2012   Procedure: OPEN REDUCTION INTERNAL FIXATION (ORIF) DISTAL PHALANX VERSUS EXTERNAL FIXATION LEFT RING FINGER;  Surgeon: Tennis Must, MD;  Location: [facility];  Service: Orthopedics;  Laterality: Left;  . ROBOTIC ASSISTED LAP VAGINAL HYSTERECTOMY  12/04/2011   Procedure: ROBOTIC ASSISTED LAPAROSCOPIC VAGINAL HYSTERECTOMY;  Surgeon: Imagene Gurney A. Alycia Rossetti, MD;  Location: [facility];  Service: Gynecology;  Laterality: N/A;  . SHOULDER ARTHROSCOPY WITH ROTATOR CUFF REPAIR AND SUBACROMIAL DECOMPRESSION Left 11/27/2013   Procedure: LEFT SHOULDER ARTHROSCOPY, SUBACROMIAL DECOMPRESSION, PARTIAL  ACROMIOPLASTY WITH  CORACOACROMIAL RELEASE, DISTAL CLAVICULECTOMY WITH ROTATOR CUFF REPAIR AND EXTENSIVE DEBRIDEMENT;  Surgeon: Lorn Junes, MD;  Location: [facility];  Service: Orthopedics;  Laterality: Left;    Social History   Social History  . Marital status: Married    Spouse name: N/A  . Number of children: N/A  . Years of education: N/A   Social History Main Topics  . Smoking status: Never Smoker  . Smokeless tobacco: Never Used  . Alcohol use 0.5 - 1.0 oz/week    1 - 2 drink(s) per week     Comment: social  . Drug use: No  . Sexual  activity: No     Comment: hysterectomy   Other Topics Concern  . 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 Arthritis Mother   . Cancer Father 91    prostrate  . Hypertension Father   . Stroke Father     in setting of sepsis f/ RMSF  . Heart disease Brother 59    cardiomyopathy s/p ICD, on transplant list    Review of Systems  Constitutional: Negative for fever.  Eyes: Negative for visual disturbance.  Respiratory: Negative for cough, shortness of breath and wheezing.   Cardiovascular: Negative for chest pain, palpitations and leg swelling.  Neurological: Negative for light-headedness, numbness and headaches.       Objective:   Vitals:   06/27/16 0937  BP: 134/86  Pulse: 74  Resp: 16  Temp: 98.4 F (36.9 C)   Filed Weights   06/27/16 0937  Weight: 166 lb (75.3 kg)   Body mass index is 30.36 kg/m.  Wt Readings from Last 3 Encounters:  06/27/16 166 lb (75.3 kg)  03/23/16 168 lb (76.2 kg)  03/08/16 168 lb (76.2 kg)     Physical Exam Constitutional: Appears well-developed and well-nourished. No distress.  HENT:  Head: Normocephalic and atraumatic.  Neck: Neck supple. No tracheal deviation present. No thyromegaly present.  No cervical lymphadenopathy Cardiovascular: Normal rate, regular rhythm and normal heart sounds.   No murmur heard. No carotid bruit .  No edema Pulmonary/Chest: Effort normal and breath sounds normal. No respiratory distress. No has no wheezes. No rales.  Skin: Skin is warm and dry. Not diaphoretic.  Psychiatric: Normal mood and affect. Behavior is normal.         Assessment & Plan:   See Problem List for Assessment and Plan of chronic medical problems.

## 2016-07-04 ENCOUNTER — Other Ambulatory Visit: Payer: Self-pay | Admitting: Internal Medicine

## 2016-07-04 DIAGNOSIS — E039 Hypothyroidism, unspecified: Secondary | ICD-10-CM

## 2016-07-04 DIAGNOSIS — E119 Type 2 diabetes mellitus without complications: Secondary | ICD-10-CM

## 2016-07-04 MED ORDER — LEVOTHYROXINE SODIUM 50 MCG PO TABS: 50.0000 ug | ORAL_TABLET | Freq: Every day | ORAL | 3 refills | Status: DC

## 2016-07-05 MED FILL — GAVILYTE-N SOLUTION: 420 | 1 days supply | Qty: 4000 | Fill #0

## 2016-07-05 MED FILL — LEVOTHYROXINE 50 MCG TABLET: 50 | 90 days supply | Qty: 90 | Fill #0

## 2016-07-10 DIAGNOSIS — Z1211 Encounter for screening for malignant neoplasm of colon: Secondary | ICD-10-CM | POA: Diagnosis not present

## 2016-07-10 LAB — HM COLONOSCOPY

## 2016-07-17 ENCOUNTER — Other Ambulatory Visit: Payer: Self-pay | Admitting: Internal Medicine

## 2016-07-17 MED FILL — FLUTICASONE PROP 50 MCG SPR: 50 | 90 days supply | Qty: 48 | Fill #0

## 2016-07-26 ENCOUNTER — Encounter: Payer: Self-pay | Admitting: Internal Medicine

## 2016-08-14 MED FILL — VICTOZA 18 MG/3 ML INJECT P: 18 | 30 days supply | Qty: 9 | Fill #1

## 2016-08-21 MED FILL — SERTRALINE HCL 100 MG TAB: 100 | 90 days supply | Qty: 90 | Fill #1

## 2016-09-13 MED FILL — UNIFINE PENTIPS 31GX3/16: 31G X 5 MM | 90 days supply | Qty: 100 | Fill #0

## 2016-09-13 MED FILL — UNIFINE PENTIPS 31GX3/16": 31G X 5 MM | 90 days supply | Qty: 100 | Fill #0

## 2016-09-25 ENCOUNTER — Other Ambulatory Visit (INDEPENDENT_AMBULATORY_CARE_PROVIDER_SITE_OTHER): Payer: 59

## 2016-09-25 ENCOUNTER — Encounter: Payer: Self-pay | Admitting: Internal Medicine

## 2016-09-25 ENCOUNTER — Ambulatory Visit (INDEPENDENT_AMBULATORY_CARE_PROVIDER_SITE_OTHER): Payer: 59 | Admitting: Internal Medicine

## 2016-09-25 VITALS — BP 132/84 | HR 77 | Temp 97.8°F | Resp 16 | Wt 155.0 lb

## 2016-09-25 DIAGNOSIS — E119 Type 2 diabetes mellitus without complications: Secondary | ICD-10-CM

## 2016-09-25 DIAGNOSIS — F32A Depression, unspecified: Secondary | ICD-10-CM

## 2016-09-25 DIAGNOSIS — B351 Tinea unguium: Secondary | ICD-10-CM | POA: Insufficient documentation

## 2016-09-25 DIAGNOSIS — E7800 Pure hypercholesterolemia, unspecified: Secondary | ICD-10-CM

## 2016-09-25 DIAGNOSIS — E039 Hypothyroidism, unspecified: Secondary | ICD-10-CM

## 2016-09-25 DIAGNOSIS — F329 Major depressive disorder, single episode, unspecified: Secondary | ICD-10-CM

## 2016-09-25 LAB — COMPREHENSIVE METABOLIC PANEL
ALBUMIN: 4.3 g/dL (ref 3.5–5.2)
ALK PHOS: 64 U/L (ref 39–117)
ALT: 11 U/L (ref 0–35)
AST: 14 U/L (ref 0–37)
BILIRUBIN TOTAL: 0.7 mg/dL (ref 0.2–1.2)
BUN: 25 mg/dL — AB (ref 6–23)
CO2: 29 mEq/L (ref 19–32)
CREATININE: 0.71 mg/dL (ref 0.40–1.20)
Calcium: 9.3 mg/dL (ref 8.4–10.5)
Chloride: 103 mEq/L (ref 96–112)
GFR: 89.14 mL/min (ref >60.00)
GLUCOSE: 122 mg/dL — AB (ref 70–99)
Potassium: 4.3 mEq/L (ref 3.5–5.1)
Sodium: 138 mEq/L (ref 135–145)
TOTAL PROTEIN: 7.5 g/dL (ref 6.0–8.3)

## 2016-09-25 LAB — LIPID PANEL
CHOL/HDL RATIO: 5
Cholesterol: 296 mg/dL — ABNORMAL HIGH (ref 0–200)
HDL: 57.7 mg/dL (ref >39.00)
LDL CALC: 218 mg/dL — AB (ref 0–99)
NONHDL: 238.27
Triglycerides: 99 mg/dL (ref 0.0–149.0)
VLDL: 19.8 mg/dL (ref 0.0–40.0)

## 2016-09-25 LAB — TSH: TSH: 4.15 u[IU]/mL (ref 0.35–4.50)

## 2016-09-25 LAB — HEMOGLOBIN A1C: HEMOGLOBIN A1C: 6.9 % — AB (ref 4.6–6.5)

## 2016-09-25 MED ORDER — LIRAGLUTIDE -WEIGHT MANAGEMENT 18 MG/3ML ~~LOC~~ SOPN: 3.0000 mg | PEN_INJECTOR | Freq: Every day | SUBCUTANEOUS | 3 refills | Status: DC

## 2016-09-25 NOTE — Assessment & Plan Note (Signed)
Controlled, stable ? Still need medication - she will likely try to taper off the medication - discussed how to do this slowly and re-evaluate on lower doses

## 2016-09-25 NOTE — Patient Instructions (Signed)
  Test(s) ordered today. Your results will be released to [place] (or called to you) after review, usually within 72hours after test completion. If any changes need to be made, you will be notified at that same time.    Medications reviewed and updated.  Changes include changing victoza saxenda.   Your prescription(s) have been submitted to your pharmacy. Please take as directed and contact our office if you believe you are having problem(s) with the medication(s).    Please followup in 6 months.

## 2016-09-25 NOTE — Progress Notes (Signed)
Subjective:    Patient ID: Cheryl Chan, female    DOB: [birth year], 61 y.o.   MRN: [id number]  HPI The patient is here for follow up.  Diabetes: She is taking her victoza daily as prescribed. She is compliant with a diabetic diet. She is exercising regularly - walking dog, builds furniture, yard work. She monitors her sugars and they have been running 120-140's. She checks her feet daily and denies foot lesions. She is up-to-date with an ophthalmology examination.   Depression: She is taking her medication daily as prescribed. She denies any side effects from the medication. She feels her depression is well controlled and wonders if she needs the medication.  She is thinking about tapering off the medication.   Hypothyroidism:  She is taking her medication daily.  She denies any recent changes in energy or weight that are unexplained.   Hyperlipidemia: She is intolerant to statins. She is compliant with a low fat/cholesterol diet. She is exercising.     Medications and allergies reviewed with patient and updated if appropriate.  Patient Active Problem List   Diagnosis Date Noted  . Overweight 01/25/2015  . Rotator cuff injury   . Uterine cancer ([place]) 11/12/2011  . Diabetes ([place])   . Hypothyroidism   . Hyperlipidemia   . Depression     Current Outpatient Prescriptions on File Prior to Visit  Medication Sig Dispense Refill  . fluticasone (FLONASE) 50 MCG/ACT nasal spray PLACE 2 SPRAYS INTO BOTH NOSTRILS DAILY. 48 g 2  . glucose blood (FREESTYLE LITE) test strip Use as instructed 100 each 12  . Insulin Pen Needle (BD ULTRA-FINE MICRO PEN NEEDLE) 32G X 6 MM MISC UAD daily with victoza 90 each 3  . Lancets (FREESTYLE) lancets Use as instructed 100 each 12  . levothyroxine (SYNTHROID, LEVOTHROID) 50 MCG tablet Take 1 tablet (50 mcg total) by mouth daily. 90 tablet 3  . liraglutide 18 MG/3ML SOPN 0.6 mg [place] qd x1wk, then 1.2 mg [place] qd; Max: 1.8 mg/day 5 pen 3  . sertraline (ZOLOFT)  100 MG tablet Take 1 tablet (100 mg total) by mouth daily. 90 tablet 4   No current facility-administered medications on file prior to visit.     Past Medical History:  Diagnosis Date  . Anxiety   . Arthritis   . Complex endometrial hyperplasia 2005  . 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 Diet-controlled type 2 diabetes mellitus ([place])   . Endometrial cancer ([place]) 10/2011 dx   s/p vag hyst 11/2011  . History of chicken pox   . Hyperlipidemia   . Hypothyroidism   . 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 Rotator cuff injury     Past Surgical History:  Procedure Laterality Date  . ABDOMINAL HYSTERECTOMY    . CESAREAN SECTION     x's 4  . DILATION AND CURETTAGE OF UTERUS     hysteroscopy  . FRACTURE SURGERY     nose as a child  . OPEN REDUCTION INTERNAL FIXATION (ORIF) DISTAL PHALANX Left 08/05/2012   Procedure: OPEN REDUCTION INTERNAL FIXATION (ORIF) DISTAL PHALANX VERSUS EXTERNAL FIXATION LEFT RING FINGER;  Surgeon: Tennis Must, MD;  Location: [facility];  Service: Orthopedics;  Laterality: Left;  . ROBOTIC ASSISTED LAP VAGINAL HYSTERECTOMY  12/04/2011   Procedure: ROBOTIC ASSISTED LAPAROSCOPIC VAGINAL HYSTERECTOMY;  Surgeon: Imagene Gurney A. Alycia Rossetti, MD;  Location: [facility];  Service: Gynecology;  Laterality: N/A;  . SHOULDER ARTHROSCOPY  WITH ROTATOR CUFF REPAIR AND SUBACROMIAL DECOMPRESSION Left 11/27/2013   Procedure: LEFT SHOULDER ARTHROSCOPY, SUBACROMIAL DECOMPRESSION, PARTIAL ACROMIOPLASTY WITH  CORACOACROMIAL RELEASE, DISTAL CLAVICULECTOMY WITH ROTATOR CUFF REPAIR AND EXTENSIVE DEBRIDEMENT;  Surgeon: Lorn Junes, MD;  Location: [facility];  Service: Orthopedics;  Laterality: Left;    Social History   Social History  . Marital status: Married    Spouse name: N/A  . Number of children: N/A  . Years of education: N/A   Social History Main Topics  . Smoking status: Never Smoker  . Smokeless tobacco: Never Used  . Alcohol use 0.5 - 1.0  oz/week    1 - 2 drink(s) per week     Comment: social  . Drug use: No  . Sexual activity: No     Comment: hysterectomy   Other Topics Concern  . 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 Arthritis Mother   . Cancer Father 44    prostrate  . Hypertension Father   . Stroke Father     in setting of sepsis f/ RMSF  . Heart disease Brother 19    cardiomyopathy s/p ICD, on transplant list    Review of Systems  Constitutional: Negative for chills and fever.  Respiratory: Negative for cough, shortness of breath and wheezing.   Cardiovascular: Negative for chest pain, palpitations and leg swelling.  Neurological: Negative for dizziness, light-headedness and headaches.       Objective:   Vitals:   09/25/16 0835  BP: 132/84  Pulse: 77  Resp: 16  Temp: 97.8 F (36.6 C)   Wt Readings from Last 3 Encounters:  09/25/16 155 lb (70.3 kg)  06/27/16 166 lb (75.3 kg)  03/23/16 168 lb (76.2 kg)   Body mass index is 28.35 kg/m.   Physical Exam    Constitutional: Appears well-developed and well-nourished. No distress.  HENT:  Head: Normocephalic and atraumatic.  Neck: Neck supple. No tracheal deviation present. No thyromegaly present.  No cervical lymphadenopathy Cardiovascular: Normal rate, regular rhythm and normal heart sounds.   No murmur heard. No carotid bruit .  No edema Pulmonary/Chest: Effort normal and breath sounds normal. No respiratory distress. No has no wheezes. No rales.  Skin: Skin is warm and dry. Not diaphoretic. onychomycosis of b/l great toenails Psychiatric: Normal mood and affect. Behavior is normal.      Assessment & Plan:    See Problem List for Assessment and Plan of chronic medical problems.

## 2016-09-25 NOTE — Assessment & Plan Note (Signed)
Intolerant of statins Check cholesterol Consider zetia - she will think about it

## 2016-09-25 NOTE — Assessment & Plan Note (Signed)
Check tsh  Titrate med dose if needed  

## 2016-09-25 NOTE — Assessment & Plan Note (Signed)
Discussed options for treatment - would recommend topical - she will think about it

## 2016-09-25 NOTE — Progress Notes (Signed)
Pre visit review using our clinic review tool, if applicable. No additional management support is needed unless otherwise documented below in the visit note. 

## 2016-09-25 NOTE — Assessment & Plan Note (Addendum)
Check a1c Low sugar / carb diet Stressed regular exercise,  weight loss Will try changing from victoza to saxenda for continued DM controlled and increased weight loss

## 2016-09-26 ENCOUNTER — Other Ambulatory Visit: Payer: Self-pay | Admitting: Emergency Medicine

## 2016-09-26 MED ORDER — ZOSTER VAC RECOMB ADJUVANTED 50 MCG/0.5ML IM SUSR: 0.5000 mL | Freq: Once | INTRAMUSCULAR | 1 refills | Status: AC

## 2016-09-26 MED FILL — SHINGRIX 50 MCG SUS: 50 | 1 days supply | Qty: 1 | Fill #0

## 2016-09-27 ENCOUNTER — Encounter: Payer: Self-pay | Admitting: Internal Medicine

## 2016-10-02 MED FILL — LEVOTHYROXINE 50 MCG TABLET: 50 | 90 days supply | Qty: 90 | Fill #1

## 2016-10-04 ENCOUNTER — Telehealth: Payer: Self-pay | Admitting: Emergency Medicine

## 2016-10-04 NOTE — Telephone Encounter (Signed)
Appeal has been completed for Saxenda. Case no. [id number]. Awaiting approval.

## 2016-10-10 ENCOUNTER — Encounter: Payer: Self-pay | Admitting: Internal Medicine

## 2016-10-10 ENCOUNTER — Ambulatory Visit (INDEPENDENT_AMBULATORY_CARE_PROVIDER_SITE_OTHER): Payer: 59 | Admitting: Internal Medicine

## 2016-10-10 VITALS — BP 116/68 | HR 86 | Temp 98.5°F | Resp 16 | Wt 156.0 lb

## 2016-10-10 DIAGNOSIS — H9203 Otalgia, bilateral: Secondary | ICD-10-CM | POA: Diagnosis not present

## 2016-10-10 MED ORDER — LIRAGLUTIDE 18 MG/3ML ~~LOC~~ SOPN: PEN_INJECTOR | SUBCUTANEOUS | 3 refills | Status: DC

## 2016-10-10 NOTE — Progress Notes (Signed)
Subjective:    Patient ID: Cheryl Chan, female    DOB: 1956-01-23, 61 y.o.   MRN: [id number]  HPI She is here for an acute visit.   For the past week both of her ears have hurt and she has had decreased hearing.  She has a history of ear wax impaction and tried to clean her ears out at home and it made her symptoms worse.  She denies fever, sore throat, nasal congestion or other cold symptoms.  She will be flying soon and wanted to make sure they were cleaned out if needed.   Medications and allergies reviewed with patient and updated if appropriate.  Patient Active Problem List   Diagnosis Date Noted  . Onychomycosis of toenail 09/25/2016  . Overweight 01/25/2015  . Rotator cuff injury   . Uterine cancer ([place]) 11/12/2011  . Diabetes ([place])   . Hypothyroidism   . Hyperlipidemia   . Depression     Current Outpatient Prescriptions on File Prior to Visit  Medication Sig Dispense Refill  . fluticasone (FLONASE) 50 MCG/ACT nasal spray PLACE 2 SPRAYS INTO BOTH NOSTRILS DAILY. 48 g 2  . glucose blood (FREESTYLE LITE) test strip Use as instructed 100 each 12  . Insulin Pen Needle (BD ULTRA-FINE MICRO PEN NEEDLE) 32G X 6 MM MISC UAD daily with victoza 90 each 3  . Lancets (FREESTYLE) lancets Use as instructed 100 each 12  . levothyroxine (SYNTHROID, LEVOTHROID) 50 MCG tablet Take 1 tablet (50 mcg total) by mouth daily. 90 tablet 3  . Liraglutide -Weight Management (SAXENDA) 18 MG/3ML SOPN Inject 3 mg into the skin daily. 0.6 mg [place] qd x1wk, then may incr. dose by 0.6 mg/day qwk; Max: 3 mg/day 5 pen 3  . liraglutide 18 MG/3ML SOPN 0.6 mg [place] qd x1wk, then 1.2 mg  qd; Max: 1.8 mg/day 5 pen 3  . sertraline (ZOLOFT) 100 MG tablet Take 1 tablet (100 mg total) by mouth daily. 90 tablet 4   No current facility-administered medications on file prior to visit.     Past Medical History:  Diagnosis Date  . Anxiety   . Arthritis   . Complex endometrial hyperplasia 2005  . Depression fall  2012   manifest as "psuedodementia"  . Diet-controlled type 2 diabetes mellitus ([place])   . Endometrial cancer ([place]) 10/2011 dx   s/p vag hyst 11/2011  . History of chicken pox   . Hyperlipidemia   . Hypothyroidism   . 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 Rotator cuff injury     Past Surgical History:  Procedure Laterality Date  . ABDOMINAL HYSTERECTOMY    . CESAREAN SECTION     x's 4  . DILATION AND CURETTAGE OF UTERUS     hysteroscopy  . FRACTURE SURGERY     nose as a child  . OPEN REDUCTION INTERNAL FIXATION (ORIF) DISTAL PHALANX Left 08/05/2012   Procedure: OPEN REDUCTION INTERNAL FIXATION (ORIF) DISTAL PHALANX VERSUS EXTERNAL FIXATION LEFT RING FINGER;  Surgeon: Tennis Must, MD;  Location: [facility];  Service: Orthopedics;  Laterality: Left;  . ROBOTIC ASSISTED LAP VAGINAL HYSTERECTOMY  12/04/2011   Procedure: ROBOTIC ASSISTED LAPAROSCOPIC VAGINAL HYSTERECTOMY;  Surgeon: Imagene Gurney A. Alycia Rossetti, MD;  [facility];  Service: Gynecology;  Laterality: N/A;  . SHOULDER ARTHROSCOPY WITH ROTATOR CUFF REPAIR AND SUBACROMIAL DECOMPRESSION Left 11/27/2013   Procedure: LEFT SHOULDER ARTHROSCOPY, SUBACROMIAL DECOMPRESSION, PARTIAL ACROMIOPLASTY WITH  CORACOACROMIAL RELEASE, DISTAL CLAVICULECTOMY WITH ROTATOR CUFF  REPAIR AND EXTENSIVE DEBRIDEMENT;  Surgeon: Lorn Junes, MD;  Location: [facility];  Service: Orthopedics;  Laterality: Left;    Social History   Social History  . Marital status: Married    Spouse name: N/A  . Number of children: N/A  . Years of education: N/A   Social History Main Topics  . Smoking status: Never Smoker  . Smokeless tobacco: Never Used  . Alcohol use 0.5 - 1.0 oz/week    1 - 2 drink(s) per week     Comment: social  . Drug use: No  . Sexual activity: No     Comment: hysterectomy   Other Topics Concern  . 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 Arthritis Mother   . Cancer Father 43       prostrate  . Hypertension Father   . Stroke Father        in setting of sepsis f/ RMSF  . Heart disease Brother 37       cardiomyopathy s/p ICD, on transplant list    Review of Systems  Constitutional: Negative for chills and fever.  HENT: Positive for ear pain and hearing loss. Negative for congestion, sinus pain, sinus pressure and sore throat.   Respiratory: Negative for cough, shortness of breath and wheezing.   Neurological: Negative for light-headedness and headaches.       Objective:   Vitals:   10/10/16 1448  BP: 116/68  Pulse: 86  Resp: 16  Temp: 98.5 F (36.9 C)   Filed Weights   10/10/16 1448  Weight: 156 lb (70.8 kg)   Body mass index is 28.53 kg/m.  Wt Readings from Last 3 Encounters:  10/10/16 156 lb (70.8 kg)  09/25/16 155 lb (70.3 kg)  06/27/16 166 lb (75.3 kg)     Physical Exam PRE-PROCEDURE EXAM: B/l TMs cannot be visualized due to total occlusion/impaction of both ear canal.  PROCEDURE INDICATION: remove wax to visualize ear drum & improve hearing & relieve discomfort CONSENT:  Verbal  PROCEDURE NOTE:  LEFT EAR:  The ear was irrigated with warm water to remove the wax. The CMA used a metal wax curette under direct vision with an otoscope to free the wax bolus from the ear wall and then successfully removed a large amount of wax. RIGHT EAR: The ear was irrigated with warm water to remove the wax. The CMA used a metal wax curette under direct vision with an otoscope to free the wax bolus from the ear wall and then successfully removed a large amount of wax.   POST- PROCEDURE EXAM: TMs successfully visualized and found intact.  B/l TM's normal appearing.  No remained cerumen in canals.  B/l canals with minimal erythema from procedure.  She  tolerated the procedure well and hearing was restored.   Discomfort in ears resolved.        Assessment & Plan:   See Problem List for Assessment and Plan of chronic medical problems.

## 2016-10-10 NOTE — Assessment & Plan Note (Signed)
associated with hearing loss - secondary to excessive cerumen b/l Both ears successfully cleaned of wax

## 2016-10-10 NOTE — Patient Instructions (Addendum)
Your ears were cleaned out successfully.  If your ears bother you please call.

## 2016-10-10 NOTE — Telephone Encounter (Signed)
Cheryl Chan will not be covered by pts insurance. Pt is coming in today for OV and Dr Quay Burow will discuss alternatives.

## 2016-10-11 MED FILL — VICTOZA 18 MG/3 ML INJECT P: 18 | 30 days supply | Qty: 9 | Fill #2

## 2016-11-12 MED FILL — VICTOZA 18 MG/3 ML INJECT P: 18 | 30 days supply | Qty: 9 | Fill #3

## 2016-11-12 MED FILL — FLUTICASONE PROP 50 MCG SPR: 50 | 90 days supply | Qty: 48 | Fill #1

## 2016-11-22 MED FILL — SERTRALINE HCL 100 MG TAB: 100 | 90 days supply | Qty: 90 | Fill #2

## 2016-11-26 MED FILL — SHINGRIX 50 MCG SUS: 50 | 1 days supply | Qty: 1 | Fill #1

## 2016-12-06 ENCOUNTER — Other Ambulatory Visit: Payer: Self-pay | Admitting: Internal Medicine

## 2016-12-07 NOTE — Telephone Encounter (Signed)
Please advise, I do not see on current med list.

## 2016-12-07 NOTE — Telephone Encounter (Signed)
On list under generic - sent

## 2016-12-12 MED FILL — VICTOZA 18 MG/3 ML INJECT P: 18 | 30 days supply | Qty: 9 | Fill #0

## 2016-12-27 MED FILL — LEVOTHYROXINE 50 MCG TABLET: 50 | 90 days supply | Qty: 90 | Fill #2

## 2016-12-27 MED FILL — UNIFINE PENTIPS 31GX3/16": 31G X 5 MM | 90 days supply | Qty: 100 | Fill #1

## 2016-12-27 MED FILL — UNIFINE PENTIPS 31GX3/16: 31G X 5 MM | 90 days supply | Qty: 100 | Fill #1

## 2017-01-14 MED FILL — VICTOZA 18 MG/3 ML INJECT P: 18 | 30 days supply | Qty: 9 | Fill #1

## 2017-02-13 MED FILL — VICTOZA 18 MG/3 ML INJECT P: 18 | 30 days supply | Qty: 9 | Fill #2

## 2017-02-25 MED FILL — SERTRALINE HCL 100 MG TAB: 100 | 90 days supply | Qty: 90 | Fill #3

## 2017-03-04 MED FILL — FLUTICASONE PROP 50 MCG SPR: 50 | 90 days supply | Qty: 48 | Fill #2

## 2017-03-12 ENCOUNTER — Encounter: Payer: Self-pay | Admitting: Certified Nurse Midwife

## 2017-03-12 ENCOUNTER — Ambulatory Visit (INDEPENDENT_AMBULATORY_CARE_PROVIDER_SITE_OTHER): Payer: 59 | Admitting: Certified Nurse Midwife

## 2017-03-12 VITALS — BP 110/64 | HR 68 | Resp 16 | Ht 62.25 in | Wt 157.0 lb

## 2017-03-12 DIAGNOSIS — Z01419 Encounter for gynecological examination (general) (routine) without abnormal findings: Secondary | ICD-10-CM

## 2017-03-12 DIAGNOSIS — Z8542 Personal history of malignant neoplasm of other parts of uterus: Secondary | ICD-10-CM | POA: Diagnosis not present

## 2017-03-12 DIAGNOSIS — N6310 Unspecified lump in the right breast, unspecified quadrant: Secondary | ICD-10-CM | POA: Diagnosis not present

## 2017-03-12 DIAGNOSIS — Z780 Asymptomatic menopausal state: Secondary | ICD-10-CM

## 2017-03-12 DIAGNOSIS — Z124 Encounter for screening for malignant neoplasm of cervix: Secondary | ICD-10-CM | POA: Diagnosis not present

## 2017-03-12 NOTE — Progress Notes (Signed)
Patient scheduled while in office for bilateral diagnostic MMG and right breast US. Spoke with Kenney Houseman at [facility]. Scheduled for 03/14/17 arriving at 11:30am for 11:50am appointment. Patient verbalizes understanding and is agreeable.

## 2017-03-12 NOTE — Patient Instructions (Signed)

## 2017-03-12 NOTE — Progress Notes (Signed)
61 y.o. G67P4 Married  Caucasian Fe here for annual exam. Menopausal no HRT. Denies vaginal bleeding or vaginal dryness. Sees PCP Celso Amy every 3 months for diabetes/hypothyroid/anxiety  management/labs and aex. Not stable at present.. Aware of mammogram due. She did not get reminder, will schedule prior to leaving today.. Missed last cancer exam and will reschedule with oncology. Feels good no issue today.Retired now! No other health issues today..  Patient's last menstrual period was 05/22/2003.          Sexually active: No.  The current method of family planning is status post hysterectomy.    Exercising: Yes.    walking Smoker:  no  Health Maintenance: Pap:  02-15-15 neg, 03-08-16 neg History of Abnormal Pap: endometrial cancer, hysterectomy MMG:  10-13-15 category b density birads 1:neg Self Breast exams: no Colonoscopy:  2018 normal 10 years BMD:   07-28-13 normal TDaP:  2014 Shingles: 2018 Pneumonia: 2014 Hep C and HIV: both neg 2016 Labs: no   reports that she has never smoked. She has never used smokeless tobacco. She reports that she drinks about 0.5 - 1.0 oz of alcohol per week . She reports that she does not use drugs.  Past Medical History:  Diagnosis Date  . Anxiety   . Arthritis   . Complex endometrial hyperplasia 2005  . 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 Diet-controlled type 2 diabetes mellitus ([place])   . Endometrial cancer ([place]) 10/2011 dx   s/p vag hyst 11/2011  . History of chicken pox   . Hyperlipidemia   . Hypothyroidism   . 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 Rotator cuff injury     Past Surgical History:  Procedure Laterality Date  . ABDOMINAL HYSTERECTOMY    . CESAREAN SECTION     x's 4  . DILATION AND CURETTAGE OF UTERUS     hysteroscopy  . FRACTURE SURGERY     nose as a child  . OPEN REDUCTION INTERNAL FIXATION (ORIF) DISTAL PHALANX Left 08/05/2012   Procedure: OPEN REDUCTION INTERNAL FIXATION (ORIF) DISTAL PHALANX VERSUS  EXTERNAL FIXATION LEFT RING FINGER;  Surgeon: Tennis Must, MD;  Location: [facility];  Service: Orthopedics;  Laterality: Left;  . ROBOTIC ASSISTED LAP VAGINAL HYSTERECTOMY  12/04/2011   Procedure: ROBOTIC ASSISTED LAPAROSCOPIC VAGINAL HYSTERECTOMY;  Surgeon: Imagene Gurney A. Alycia Rossetti, MD;  [facility];  Service: Gynecology;  Laterality: N/A;  . SHOULDER ARTHROSCOPY WITH ROTATOR CUFF REPAIR AND SUBACROMIAL DECOMPRESSION Left 11/27/2013   Procedure: LEFT SHOULDER ARTHROSCOPY, SUBACROMIAL DECOMPRESSION, PARTIAL ACROMIOPLASTY WITH  CORACOACROMIAL RELEASE, DISTAL CLAVICULECTOMY WITH ROTATOR CUFF REPAIR AND EXTENSIVE DEBRIDEMENT;  Surgeon: Lorn Junes, MD;  Location: [facility];  Service: Orthopedics;  Laterality: Left;    Current Outpatient Prescriptions  Medication Sig Dispense Refill  . fluticasone (FLONASE) 50 MCG/ACT nasal spray PLACE 2 SPRAYS INTO BOTH NOSTRILS DAILY. 48 g 2  . glucose blood (FREESTYLE LITE) test strip Use as instructed 100 each 12  . Insulin Pen Needle (BD ULTRA-FINE MICRO PEN NEEDLE) 32G X 6 MM MISC UAD daily with victoza 90 each 3  . Lancets (FREESTYLE) lancets Use as instructed 100 each 12  . levothyroxine (SYNTHROID, LEVOTHROID) 50 MCG tablet Take 1 tablet (50 mcg total) by mouth daily. 90 tablet 3  . liraglutide (VICTOZA) 18 MG/3ML SOPN Inject 0.3 mLs (1.8 mg total) into the skin daily. 9 mL 3  . liraglutide 18 MG/3ML SOPN 0.6 mg [place] qd x1wk,  then 1.2 mg [place] qd; Max: 1.8 mg/day 5 pen 3  . sertraline (ZOLOFT) 100 MG tablet Take 1 tablet (100 mg total) by mouth daily. 90 tablet 4   No current facility-administered medications for this visit.     Family History  Problem Relation Age of Onset  . Arthritis Mother   . Cancer Father 87       prostrate  . Hypertension Father   . Stroke Father        in setting of sepsis f/ RMSF  . Heart disease Brother 3       cardiomyopathy s/p ICD, on transplant list    ROS:  Pertinent items are noted in  HPI.  Otherwise, a comprehensive ROS was negative.  Exam:   LMP 05/22/2003    Ht Readings from Last 3 Encounters:  03/23/16 5\' 2"  (1.575 m)  03/08/16 5\' 2"  (1.575 m)  08/31/15 5' 2.5" (1.588 m)    General appearance: alert, cooperative and appears stated age Head: Normocephalic, without obvious abnormality, atraumatic Neck: no adenopathy, supple, symmetrical, trachea midline and thyroid normal to inspection and palpation Lungs: clear to auscultation bilaterally Breasts: normal appearance, no masses or tenderness, No nipple retraction or dimpling, No nipple discharge or bleeding, No axillary or supraclavicular adenopathy, mass noted ? lymph node noted at 9 o'clock at very outer edge of breast, non tedner, pea size  Heart: regular rate and rhythm Abdomen: soft, non-tender; no masses,  no organomegaly Extremities: extremities normal, atraumatic, no cyanosis or edema Skin: Skin color, texture, turgor normal. No rashes or lesions Lymph nodes: Cervical, supraclavicular, and axillary nodes normal. No abnormal inguinal nodes palpated Neurologic: Grossly normal   Pelvic: External genitalia:  no lesions              Urethra:  normal appearing urethra with no masses, tenderness or lesions              Bartholin's and Skene's: normal                 Vagina: normal appearing vagina with normal color and discharge, no lesions              Cervix: absent              Pap taken: No. Bimanual Exam:  Uterus:  uterus absent              Adnexa: no mass, fullness, tenderness               Rectovaginal: Confirms               Anus:  normal sphincter tone, no lesions  Chaperone present: yes  A:  Well Woman with normal exam  Menopausal s/p TAH with BSO  For endometrial cancer in 2013. 5 years cancer free!! Oncology follow up due  Right breast mass ? Lymph node.  Diabetes/hypothyroid/anxiety with PCP management  BMD due   P:   Reviewed health and wellness pertinent to exam  Aware of need to  evaluate if vaginal bleeding  Patient has one more follow up with endometrial cancer and will schedule. All normal in past.  Discussed finding of ? Breast mass vs lymph node and need to evaluate with diagnostic mammogram and [place]. Patient agreeable. Will be scheduled prior to leaving today.  Will also schedule BMD prior to leaving today.  Continue to follow up with MD as indicated.  Pap smear: no   counseled on breast self exam, mammography screening, osteoporosis,  adequate intake of calcium and vitamin D, diet and exercise  return annually or prn  An After Visit Summary was printed and given to the patient.

## 2017-03-13 ENCOUNTER — Ambulatory Visit
Admission: RE | Admit: 2017-03-13 | Discharge: 2017-03-13 | Disposition: A | Discharge status: Home / Self Care | Payer: 59 | Source: Ambulatory Visit | Attending: Certified Nurse Midwife | Admitting: Certified Nurse Midwife

## 2017-03-13 ENCOUNTER — Other Ambulatory Visit (HOSPITAL_COMMUNITY)
Admission: RE | Admit: 2017-03-13 | Discharge: 2017-03-13 | Disposition: A | Discharge status: Home / Self Care | Payer: 59 | Source: Ambulatory Visit | Attending: Certified Nurse Midwife | Admitting: Certified Nurse Midwife

## 2017-03-13 DIAGNOSIS — N6310 Unspecified lump in the right breast, unspecified quadrant: Secondary | ICD-10-CM

## 2017-03-13 DIAGNOSIS — Z124 Encounter for screening for malignant neoplasm of cervix: Secondary | ICD-10-CM | POA: Diagnosis not present

## 2017-03-13 DIAGNOSIS — R928 Other abnormal and inconclusive findings on diagnostic imaging of breast: Secondary | ICD-10-CM | POA: Diagnosis not present

## 2017-03-13 MED FILL — VICTOZA 18 MG/3 ML INJECT P: 18 | 30 days supply | Qty: 9 | Fill #3

## 2017-03-13 NOTE — Addendum Note (Signed)
Addended by: Regina Eck on: 03/13/2017 11:43 AM   Modules accepted: Orders

## 2017-03-14 ENCOUNTER — Other Ambulatory Visit: Payer: 59

## 2017-03-14 ENCOUNTER — Telehealth: Payer: Self-pay

## 2017-03-14 NOTE — Telephone Encounter (Signed)
Spoke with patient. Advised of message as seen below from [place]. Patient is agreeable. 6 week follow up scheduled for 04/23/2017 at 10 am with Melvia Heaps CNM. Patient is agreeable to date and time. Encounter closed.

## 2017-03-14 NOTE — Telephone Encounter (Signed)
-----   Message from Regina Eck, CNM sent at 03/14/2017  8:09 AM EDT ----- Reviewed Mammogram and [place]. Scattered areas of fibroglandular density noted, but no masses. Felt this could be a reactive lymph node enlargement and would like to recheck, with benign finding.

## 2017-03-18 LAB — CYTOLOGY - PAP: DIAGNOSIS: NEGATIVE

## 2017-03-21 ENCOUNTER — Telehealth: Payer: Self-pay | Admitting: Internal Medicine

## 2017-03-21 MED ORDER — INFLUENZA VAC SPLIT QUAD 0.5 ML IM SUSP: 0.5000 mL | Freq: Once | INTRAMUSCULAR | 0 refills | Status: AC

## 2017-03-21 NOTE — Telephone Encounter (Signed)
States has sent fax over to get script for flu vac for patient.  Is requesting to use the quad.

## 2017-03-22 MED FILL — FLUARIX QUADRIVALENT 0.5 ML: 0.5 | 1 days supply | Qty: 1 | Fill #0

## 2017-03-25 DIAGNOSIS — D225 Melanocytic nevi of trunk: Secondary | ICD-10-CM | POA: Diagnosis not present

## 2017-03-25 DIAGNOSIS — L814 Other melanin hyperpigmentation: Secondary | ICD-10-CM | POA: Diagnosis not present

## 2017-03-25 DIAGNOSIS — B360 Pityriasis versicolor: Secondary | ICD-10-CM | POA: Diagnosis not present

## 2017-03-25 DIAGNOSIS — D2261 Melanocytic nevi of right upper limb, including shoulder: Secondary | ICD-10-CM | POA: Diagnosis not present

## 2017-03-25 DIAGNOSIS — B351 Tinea unguium: Secondary | ICD-10-CM | POA: Diagnosis not present

## 2017-03-25 DIAGNOSIS — L218 Other seborrheic dermatitis: Secondary | ICD-10-CM | POA: Diagnosis not present

## 2017-03-25 DIAGNOSIS — L570 Actinic keratosis: Secondary | ICD-10-CM | POA: Diagnosis not present

## 2017-03-25 DIAGNOSIS — D692 Other nonthrombocytopenic purpura: Secondary | ICD-10-CM | POA: Diagnosis not present

## 2017-03-25 DIAGNOSIS — L821 Other seborrheic keratosis: Secondary | ICD-10-CM | POA: Diagnosis not present

## 2017-03-25 DIAGNOSIS — D2371 Other benign neoplasm of skin of right lower limb, including hip: Secondary | ICD-10-CM | POA: Diagnosis not present

## 2017-03-25 MED FILL — TERBINAFINE HCL 250 MG TABS: 250 | 90 days supply | Qty: 90 | Fill #0

## 2017-03-31 NOTE — Progress Notes (Signed)
Subjective:    Patient ID: Cheryl Chan, female    DOB: 1955-10-02, 61 y.o.   MRN: [id number]  HPI     Medications and allergies reviewed with patient and updated if appropriate.  Patient Active Problem List   Diagnosis Date Noted  . Ear pain, bilateral 10/10/2016  . Onychomycosis of toenail 09/25/2016  . Overweight 01/25/2015  . Rotator cuff injury   . Uterine cancer ([place]) 11/12/2011  . Diabetes ([place])   . Hypothyroidism   . Hyperlipidemia   . Depression     Current Outpatient Medications on File Prior to Visit  Medication Sig Dispense Refill  . fluticasone (FLONASE) 50 MCG/ACT nasal spray PLACE 2 SPRAYS INTO BOTH NOSTRILS DAILY. 48 g 2  . glucose blood (FREESTYLE LITE) test strip Use as instructed 100 each 12  . Insulin Pen Needle (BD ULTRA-FINE MICRO PEN NEEDLE) 32G X 6 MM MISC UAD daily with victoza 90 each 3  . Lancets (FREESTYLE) lancets Use as instructed 100 each 12  . levothyroxine (SYNTHROID, LEVOTHROID) 50 MCG tablet Take 1 tablet (50 mcg total) by mouth daily. 90 tablet 3  . liraglutide (VICTOZA) 18 MG/3ML SOPN Inject 0.3 mLs (1.8 mg total) into the skin daily. 9 mL 3  . sertraline (ZOLOFT) 100 MG tablet Take 1 tablet (100 mg total) by mouth daily. 90 tablet 4   No current facility-administered medications on file prior to visit.     Past Medical History:  Diagnosis Date  . Anxiety   . Arthritis   . Complex endometrial hyperplasia 2005  . 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 Diet-controlled type 2 diabetes mellitus ([place])   . Endometrial cancer ([place]) 10/2011 dx   s/p vag hyst 11/2011  . History of chicken pox   . Hyperlipidemia   . Hypothyroidism   . 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 Rotator cuff injury     Past Surgical History:  Procedure Laterality Date  . ABDOMINAL HYSTERECTOMY    . CESAREAN SECTION     x's 4  . DILATION AND CURETTAGE OF UTERUS     hysteroscopy  . FRACTURE SURGERY     nose as a child    Social History    Socioeconomic History  . Marital status: Married    Spouse name: Not on file  . Number of children: Not on file  . Years of education: Not on file  . Highest education level: Not on file  Social Needs  . Financial resource strain: Not on file  . Food insecurity - worry: Not on file  . Food insecurity - inability: Not on file  . Transportation needs - medical: Not on file  . Transportation needs - non-medical: Not on file  Occupational History  . Not on file  Tobacco Use  . Smoking status: Never Smoker  . Smokeless tobacco: Never Used  Substance and Sexual Activity  . Alcohol use: Yes    Alcohol/week: 0.5 - 1.0 oz    Types: 1 - 2 drink(s) per week    Comment: social  . Drug use: No  . Sexual activity: No    Partners: Male    Birth control/protection: Surgical    Comment: hysterectomy  Other Topics Concern  . 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 Arthritis Mother   . Cancer Father 10       prostrate  . Hypertension Father   . Stroke Father        in setting of sepsis f/ RMSF  . Heart disease Brother 60       cardiomyopathy s/p ICD, on transplant list    Review of Systems     Objective:  There were no vitals filed for this visit. Wt Readings from Last 3 Encounters:  03/12/17 157 lb (71.2 kg)  10/10/16 156 lb (70.8 kg)  09/25/16 155 lb (70.3 kg)   There is no height or weight on file to calculate BMI.   Physical Exam        Assessment & Plan:    See Problem List for Assessment and Plan of chronic medical problems.   This encounter was created in error - please disregard.

## 2017-04-01 MED FILL — LEVOTHYROXINE 50 MCG TABLET: 50 | 90 days supply | Qty: 90 | Fill #3

## 2017-04-01 MED FILL — UNIFINE PENTIPS 31GX3/16": 31G X 5 MM | 90 days supply | Qty: 100 | Fill #2

## 2017-04-01 MED FILL — UNIFINE PENTIPS 31GX3/16: 31G X 5 MM | 90 days supply | Qty: 100 | Fill #2

## 2017-04-02 ENCOUNTER — Other Ambulatory Visit (INDEPENDENT_AMBULATORY_CARE_PROVIDER_SITE_OTHER): Payer: 59

## 2017-04-02 ENCOUNTER — Encounter: Payer: 59 | Admitting: Internal Medicine

## 2017-04-02 DIAGNOSIS — E119 Type 2 diabetes mellitus without complications: Secondary | ICD-10-CM

## 2017-04-02 DIAGNOSIS — E039 Hypothyroidism, unspecified: Secondary | ICD-10-CM

## 2017-04-02 LAB — COMPREHENSIVE METABOLIC PANEL
ALBUMIN: 4.2 g/dL (ref 3.5–5.2)
ALK PHOS: 69 U/L (ref 39–117)
ALT: 12 U/L (ref 0–35)
AST: 13 U/L (ref 0–37)
BILIRUBIN TOTAL: 0.6 mg/dL (ref 0.2–1.2)
BUN: 17 mg/dL (ref 6–23)
CO2: 31 mEq/L (ref 19–32)
CREATININE: 0.63 mg/dL (ref 0.40–1.20)
Calcium: 9.2 mg/dL (ref 8.4–10.5)
Chloride: 101 mEq/L (ref 96–112)
GFR: 102.15 mL/min (ref >60.00)
Glucose, Bld: 132 mg/dL — ABNORMAL HIGH (ref 70–99)
Potassium: 4 mEq/L (ref 3.5–5.1)
SODIUM: 138 meq/L (ref 135–145)
TOTAL PROTEIN: 7.6 g/dL (ref 6.0–8.3)

## 2017-04-02 LAB — HEMOGLOBIN A1C: HEMOGLOBIN A1C: 6.8 % — AB (ref 4.6–6.5)

## 2017-04-02 LAB — MICROALBUMIN / CREATININE URINE RATIO
Creatinine,U: 90.3 mg/dL
MICROALB/CREAT RATIO: 0.8 mg/g (ref 0.0–30.0)
Microalb, Ur: <0.7 mg/dL (ref 0.0–1.9)

## 2017-04-02 LAB — TSH: TSH: 5.33 u[IU]/mL — ABNORMAL HIGH (ref 0.35–4.50)

## 2017-04-09 ENCOUNTER — Other Ambulatory Visit: Payer: Self-pay | Admitting: Emergency Medicine

## 2017-04-09 MED ORDER — LEVOTHYROXINE SODIUM 75 MCG PO TABS: 75.0000 ug | ORAL_TABLET | Freq: Every day | ORAL | 1 refills | Status: DC

## 2017-04-09 MED FILL — LEVOTHYROXINE 75 MCG TABLET: 75 | 90 days supply | Qty: 90 | Fill #0

## 2017-04-15 ENCOUNTER — Other Ambulatory Visit: Payer: Self-pay | Admitting: Internal Medicine

## 2017-04-16 MED FILL — VICTOZA 18 MG/3 ML INJECT P: 18 | 30 days supply | Qty: 9 | Fill #0

## 2017-04-21 NOTE — Progress Notes (Signed)
Subjective:    Patient ID: Cheryl Chan, female    DOB: 1956-03-12, 61 y.o.   MRN: [id number]  HPI The patient is here for follow up.  Hypothyroidism:  She is taking her medication daily.  She felt her energy level was a little lower.    Diabetes: She is taking her medication daily as prescribed. She is not compliant with a diabetic diet. She is exercising regularly - walks the dog a lot, refinishes furniture. She does not monitor her sugars.  She checks her feet daily and denies foot lesions. She is up-to-date with an ophthalmology examination.   Depression: She is taking her medication daily as prescribed. She denies any side effects from the medication. She feels her depression is well controlled and she is happy with her current dose of medication.   Hyperlipidemia: She is not on any medication - she is statin intolerant, but willing to try it again. She is compliant with a low fat/cholesterol diet. She is exercising regularly.      Medications and allergies reviewed with patient and updated if appropriate.  Patient Active Problem List   Diagnosis Date Noted  . Onychomycosis of toenail 09/25/2016  . Overweight 01/25/2015  . Rotator cuff injury   . Uterine cancer ([place]) 11/12/2011  . Diabetes ([place])   . Hypothyroidism   . Hyperlipidemia   . Depression     Current Outpatient Medications on File Prior to Visit  Medication Sig Dispense Refill  . fluticasone (FLONASE) 50 MCG/ACT nasal spray PLACE 2 SPRAYS INTO BOTH NOSTRILS DAILY. 48 g 2  . glucose blood (FREESTYLE LITE) test strip Use as instructed 100 each 12  . Insulin Pen Needle (BD ULTRA-FINE MICRO PEN NEEDLE) 32G X 6 MM MISC UAD daily with victoza 90 each 3  . Lancets (FREESTYLE) lancets Use as instructed 100 each 12  . levothyroxine (SYNTHROID, LEVOTHROID) 75 MCG tablet Take 1 tablet (75 mcg total) by mouth daily. 90 tablet 1  . sertraline (ZOLOFT) 100 MG tablet Take 1 tablet (100 mg total) by mouth daily. 90 tablet 4     No current facility-administered medications on file prior to visit.     Past Medical History:  Diagnosis Date  . Anxiety   . Arthritis   . Complex endometrial hyperplasia 2005  . 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 Diet-controlled type 2 diabetes mellitus ([place])   . Endometrial cancer ([place]) 10/2011 dx   s/p vag hyst 11/2011  . History of chicken pox   . Hyperlipidemia   . Hypothyroidism   . 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 Rotator cuff injury     Past Surgical History:  Procedure Laterality Date  . ABDOMINAL HYSTERECTOMY    . CESAREAN SECTION     x's 4  . DILATION AND CURETTAGE OF UTERUS     hysteroscopy  . FRACTURE SURGERY     nose as a child  . OPEN REDUCTION INTERNAL FIXATION (ORIF) DISTAL PHALANX Left 08/05/2012   Procedure: OPEN REDUCTION INTERNAL FIXATION (ORIF) DISTAL PHALANX VERSUS EXTERNAL FIXATION LEFT RING FINGER;  Surgeon: Tennis Must, MD;  Location: [facility];  Service: Orthopedics;  Laterality: Left;  . ROBOTIC ASSISTED LAP VAGINAL HYSTERECTOMY  12/04/2011   Procedure: ROBOTIC ASSISTED LAPAROSCOPIC VAGINAL HYSTERECTOMY;  Surgeon: Imagene Gurney A. Alycia Rossetti, MD;  Location: [facility];  Service: Gynecology;  Laterality: N/A;  . SHOULDER ARTHROSCOPY WITH ROTATOR CUFF REPAIR AND SUBACROMIAL DECOMPRESSION Left 11/27/2013  Procedure: LEFT SHOULDER ARTHROSCOPY, SUBACROMIAL DECOMPRESSION, PARTIAL ACROMIOPLASTY WITH  CORACOACROMIAL RELEASE, DISTAL CLAVICULECTOMY WITH ROTATOR CUFF REPAIR AND EXTENSIVE DEBRIDEMENT;  Surgeon: Lorn Junes, MD;  Location: [facility];  Service: Orthopedics;  Laterality: Left;    Social History   Socioeconomic History  . Marital status: Married    Spouse name: None  . Number of children: None  . Years of education: None  . Highest education level: None  Social Needs  . Financial resource strain: None  . Food insecurity - worry: None  . Food insecurity - inability: None  . Transportation needs -  medical: None  . Transportation needs - non-medical: None  Occupational History  . None  Tobacco Use  . Smoking status: Never Smoker  . Smokeless tobacco: Never Used  Substance and Sexual Activity  . Alcohol use: Yes    Alcohol/week: 0.5 - 1.0 oz    Types: 1 - 2 drink(s) per week    Comment: social  . Drug use: No  . Sexual activity: No    Partners: Male    Birth control/protection: Surgical    Comment: hysterectomy  Other Topics Concern  . 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 Arthritis Mother   . Cancer Father 86       prostrate  . Hypertension Father   . Stroke Father        in setting of sepsis f/ RMSF  . Heart disease Brother 69       cardiomyopathy s/p ICD, on transplant list    Review of Systems  Constitutional: Negative for chills and fever.  Respiratory: Negative for cough, shortness of breath and wheezing.   Cardiovascular: Negative for chest pain, palpitations and leg swelling.  Gastrointestinal:       Jerrye Bushy recently  Neurological: Negative for light-headedness, numbness and headaches.       Objective:   Vitals:   04/22/17 0857  BP: 124/78  Pulse: 79  Resp: 16  Temp: 98.1 F (36.7 C)  SpO2: 97%   Wt Readings from Last 3 Encounters:  04/22/17 158 lb (71.7 kg)  03/12/17 157 lb (71.2 kg)  10/10/16 156 lb (70.8 kg)   Body mass index is 28.67 kg/m.   Physical Exam    Constitutional: Appears well-developed and well-nourished. No distress.  HENT:  Head: Normocephalic and atraumatic.  Neck: Neck supple. No tracheal deviation present. No thyromegaly present.  No cervical lymphadenopathy Cardiovascular: Normal rate, regular rhythm and normal heart sounds.   No murmur heard. No carotid bruit .  No edema Pulmonary/Chest:  Effort normal and breath sounds normal. No respiratory distress. No has no wheezes. No rales.  Skin: Skin is warm and dry. Not diaphoretic.  Psychiatric: Normal mood and affect. Behavior is normal.      Assessment & Plan:    See Problem List for Assessment and Plan of chronic medical problems.

## 2017-04-21 NOTE — Patient Instructions (Addendum)
  All other Health Maintenance issues reviewed.   All recommended immunizations and age-appropriate screenings are up-to-date or discussed.  No immunizations administered today.   Medications reviewed and updated.  Changes include starting lipitor 5 mg 2-3 times a week.  We will also switch to trulicity.   Your prescription(s) have been submitted to your pharmacy. Please take as directed and contact our office if you believe you are having problem(s) with the medication(s).   Please followup in 6 months

## 2017-04-22 ENCOUNTER — Encounter: Payer: Self-pay | Admitting: Internal Medicine

## 2017-04-22 ENCOUNTER — Ambulatory Visit (INDEPENDENT_AMBULATORY_CARE_PROVIDER_SITE_OTHER): Payer: 59 | Admitting: Internal Medicine

## 2017-04-22 VITALS — BP 124/78 | HR 79 | Temp 98.1°F | Resp 16 | Wt 158.0 lb

## 2017-04-22 DIAGNOSIS — M1990 Unspecified osteoarthritis, unspecified site: Secondary | ICD-10-CM | POA: Insufficient documentation

## 2017-04-22 DIAGNOSIS — M8949 Other hypertrophic osteoarthropathy, multiple sites: Secondary | ICD-10-CM

## 2017-04-22 DIAGNOSIS — E119 Type 2 diabetes mellitus without complications: Secondary | ICD-10-CM

## 2017-04-22 DIAGNOSIS — F32A Depression, unspecified: Secondary | ICD-10-CM

## 2017-04-22 DIAGNOSIS — E7849 Other hyperlipidemia: Secondary | ICD-10-CM | POA: Diagnosis not present

## 2017-04-22 DIAGNOSIS — M150 Primary generalized (osteo)arthritis: Secondary | ICD-10-CM

## 2017-04-22 DIAGNOSIS — F329 Major depressive disorder, single episode, unspecified: Secondary | ICD-10-CM | POA: Diagnosis not present

## 2017-04-22 DIAGNOSIS — E039 Hypothyroidism, unspecified: Secondary | ICD-10-CM | POA: Diagnosis not present

## 2017-04-22 DIAGNOSIS — M159 Polyosteoarthritis, unspecified: Secondary | ICD-10-CM

## 2017-04-22 MED ORDER — ATORVASTATIN CALCIUM 10 MG PO TABS: 5.0000 mg | ORAL_TABLET | Freq: Every day | ORAL | 5 refills | Status: DC

## 2017-04-22 MED ORDER — DULAGLUTIDE 0.75 MG/0.5ML ~~LOC~~ SOAJ: 0.7500 mg | SUBCUTANEOUS | 3 refills | Status: DC

## 2017-04-22 MED FILL — ATORVASTATIN 10 MG TABLET: 10 | 60 days supply | Qty: 30 | Fill #0

## 2017-04-22 NOTE — Assessment & Plan Note (Signed)
Topical medication, tylenol  Try tumeric or tart cherry juice

## 2017-04-22 NOTE — Assessment & Plan Note (Signed)
Lipids not checked, but likely still high Will start lipitor 5 mg 2-3 times a week to see if she tolerates it Can try zetia as well

## 2017-04-22 NOTE — Assessment & Plan Note (Signed)
Lab Results  Component Value Date   HGBA1C 6.8 (H) 04/02/2017   Controlled Continue victoza - will switch to trulicity next year

## 2017-04-22 NOTE — Assessment & Plan Note (Signed)
tsh elevated - dose adjusted already

## 2017-04-22 NOTE — Assessment & Plan Note (Signed)
Controlled, stable Continue current dose of medication  

## 2017-04-23 ENCOUNTER — Ambulatory Visit (INDEPENDENT_AMBULATORY_CARE_PROVIDER_SITE_OTHER): Payer: 59 | Admitting: Certified Nurse Midwife

## 2017-04-23 ENCOUNTER — Encounter: Payer: Self-pay | Admitting: Certified Nurse Midwife

## 2017-04-23 VITALS — BP 110/64 | HR 68 | Resp 16 | Ht 62.25 in | Wt 160.0 lb

## 2017-04-23 DIAGNOSIS — Z1231 Encounter for screening mammogram for malignant neoplasm of breast: Secondary | ICD-10-CM

## 2017-04-23 DIAGNOSIS — Z1239 Encounter for other screening for malignant neoplasm of breast: Secondary | ICD-10-CM

## 2017-04-23 NOTE — Patient Instructions (Signed)
Breast Self-Awareness Breast self-awareness means:  Knowing how your breasts look.  Knowing how your breasts feel.  Checking your breasts every month for changes.  Telling your doctor if you notice a change in your breasts.  Breast self-awareness allows you to notice a breast problem early while it is still small. How to do a breast self-exam One way to learn what is normal for your breasts and to check for changes is to do a breast self-exam. To do a breast self-exam: Look for Changes  1. Take off all the clothes above your waist. 2. Stand in front of a mirror in a room with good lighting. 3. Put your hands on your hips. 4. Push your hands down. 5. Look at your breasts and nipples in the mirror to see if one breast or nipple looks different than the other. Check to see if: ? The shape of one breast is different. ? The size of one breast is different. ? There are wrinkles, dips, and bumps in one breast and not the other. 6. Look at each breast for changes in your skin, such as: ? Redness. ? Scaly areas. 7. Look for changes in your nipples, such as: ? Liquid around the nipples. ? Bleeding. ? Dimpling. ? Redness. ? A change in where the nipples are. Feel for Changes 1. Lie on your back on the floor. 2. Feel each breast. To do this, follow these steps: ? Pick a breast to feel. ? Put the arm closest to that breast above your head. ? Use your other arm to feel the nipple area of your breast. Feel the area with the pads of your three middle fingers by making small circles with your fingers. For the first circle, press lightly. For the second circle, press harder. For the third circle, press even harder. ? Keep making circles with your fingers at the light, harder, and even harder pressures as you move down your breast. Stop when you feel your ribs. ? Move your fingers a little toward the center of your body. ? Start making circles with your fingers again, this time going up until  you reach your collarbone. ? Keep making up and down circles until you reach your armpit. Remember to keep using the three pressures. ? Feel the other breast in the same way. 3. Sit or stand in the shower or tub. 4. With soapy water on your skin, feel each breast the same way you did in step 2, when you were lying on the floor. Write Down What You Find  After doing the self-exam, write down:  What is normal for each breast.  Any changes you find in each breast.  When you last had your period.  How often should I check my breasts? Check your breasts every month. If you are breastfeeding, the best time to check them is after you feed your baby or after you use a breast pump. If you get periods, the best time to check your breasts is 5-7 days after your period is over. When should I see my doctor? See your doctor if you notice:  A change in shape or size of your breasts or nipples.  A change in the skin of your breast or nipples, such as red or scaly skin.  Unusual fluid coming from your nipples.  A lump or thick area that was not there before.  Pain in your breasts.  Anything that concerns you.  This information is not intended to replace advice given to   you by your health care provider. Make sure you discuss any questions you have with your health care provider. Document Released: 10/24/2007 Document Revised: 10/13/2015 Document Reviewed: 03/27/2015 Elsevier Interactive Patient Education  2018 [employer]vier Inc[employer]  

## 2017-04-23 NOTE — Progress Notes (Signed)
   Subjective:   61 y.o. Married Caucasian female presents for follow up of right breast mass vs lymph node noted on aex on 03/12/17. Patient had diagnostic mammogram with [place] and no abnormal findings. Patient also had felt area, but has not felt it in a while now. Does SBE and denies nipple discharge, skin change, tenderness or masses bilateral. No soap change or recent shaving today. All feels normal now. No other health concerns today.  Review of Systems Pertinent items are noted in HPI.   Objective:   General appearance: alert, cooperative and appears stated age Breasts: normal appearance, no masses or tenderness, No nipple retraction or dimpling, No nipple discharge or bleeding, No axillary or supraclavicular adenopathy, no palpable masses or lymph nodes noted in either breast. Area of concern was 9 o'clock in right breast, no abnormal areas palpated today.  Assessment:   ASSESSMENT:Patient is diagnosed with normal breast exam and probably reactive lymph node on exam previously. Mammogram and [place]  finding of fibroglandular density and benign findings, no masses or lymph nodes noted.   Plan:   PLAN: Discussed negative findings today and normal mammogram and [place] noted in chart. Continue SBE monthly and report any changes. Suspect reactive lymph node. Questions addressed. Yearly mammogram recommended   Rv prn

## 2017-05-08 DIAGNOSIS — L03031 Cellulitis of right toe: Secondary | ICD-10-CM | POA: Diagnosis not present

## 2017-05-08 DIAGNOSIS — Z79899 Other long term (current) drug therapy: Secondary | ICD-10-CM | POA: Diagnosis not present

## 2017-05-20 MED FILL — VICTOZA 18 MG/3 ML INJECT P: 18 | 90 days supply | Qty: 27 | Fill #1

## 2017-05-30 ENCOUNTER — Other Ambulatory Visit: Payer: Self-pay | Admitting: Obstetrics & Gynecology

## 2017-05-30 NOTE — Telephone Encounter (Signed)
Medication refill request: zoloft Last AEX:  03/12/17 DL  Next AEX: 03/19/18  Last MMG (if hormonal medication request): 03/13/17 Right breast US BIRADS 2 benign  Refill authorized: 03/08/16 #[id number], [id number]. Today, please advise.

## 2017-06-04 DIAGNOSIS — H10501 Unspecified blepharoconjunctivitis, right eye: Secondary | ICD-10-CM | POA: Diagnosis not present

## 2017-06-04 MED FILL — NEO/POLYMYXIN/DEXAMETH DROP: 3.5-10000-0 | 12 days supply | Qty: 5 | Fill #0

## 2017-06-04 NOTE — Telephone Encounter (Signed)
Returned call to patient. Patient states she is unsure who is filling the medication for her, but will call and check with Pharmacist to see and call back.

## 2017-06-04 NOTE — Telephone Encounter (Signed)
Patient was getting this from previous PCP, I think Vinnie Level filled last time because patient seeking new PCP, which is Dr.Burns ?

## 2017-06-04 NOTE — Telephone Encounter (Signed)
Returned call to [place].

## 2017-06-04 NOTE — Telephone Encounter (Signed)
Message left to return call to Cheryl Chan at [phone].    

## 2017-06-05 ENCOUNTER — Other Ambulatory Visit: Payer: Self-pay | Admitting: Emergency Medicine

## 2017-06-05 DIAGNOSIS — N951 Menopausal and female climacteric states: Secondary | ICD-10-CM | POA: Diagnosis not present

## 2017-06-05 DIAGNOSIS — R635 Abnormal weight gain: Secondary | ICD-10-CM | POA: Diagnosis not present

## 2017-06-05 MED ORDER — SERTRALINE HCL 100 MG PO TABS: 100.0000 mg | ORAL_TABLET | Freq: Every day | ORAL | 1 refills | Status: DC

## 2017-06-05 MED FILL — SERTRALINE HCL 100 MG TAB: 100 | 90 days supply | Qty: 90 | Fill #0

## 2017-06-11 DIAGNOSIS — E119 Type 2 diabetes mellitus without complications: Secondary | ICD-10-CM | POA: Diagnosis not present

## 2017-06-11 DIAGNOSIS — R6882 Decreased libido: Secondary | ICD-10-CM | POA: Diagnosis not present

## 2017-06-11 DIAGNOSIS — E559 Vitamin D deficiency, unspecified: Secondary | ICD-10-CM | POA: Diagnosis not present

## 2017-06-11 DIAGNOSIS — E663 Overweight: Secondary | ICD-10-CM | POA: Diagnosis not present

## 2017-06-11 DIAGNOSIS — R232 Flushing: Secondary | ICD-10-CM | POA: Diagnosis not present

## 2017-06-11 DIAGNOSIS — E039 Hypothyroidism, unspecified: Secondary | ICD-10-CM | POA: Diagnosis not present

## 2017-06-11 DIAGNOSIS — N958 Other specified menopausal and perimenopausal disorders: Secondary | ICD-10-CM | POA: Diagnosis not present

## 2017-06-11 DIAGNOSIS — E782 Mixed hyperlipidemia: Secondary | ICD-10-CM | POA: Diagnosis not present

## 2017-06-11 DIAGNOSIS — R5383 Other fatigue: Secondary | ICD-10-CM | POA: Diagnosis not present

## 2017-06-12 DIAGNOSIS — E663 Overweight: Secondary | ICD-10-CM | POA: Diagnosis not present

## 2017-06-12 DIAGNOSIS — E039 Hypothyroidism, unspecified: Secondary | ICD-10-CM | POA: Diagnosis not present

## 2017-06-17 NOTE — Telephone Encounter (Signed)
Call to patient. Patient states that it is her PCP that refills zoloft for her. Advised would update Cheryl Chan, CNM.   Routing to provider for final review. Patient agreeable to disposition. Will close encounter.

## 2017-06-18 DIAGNOSIS — E663 Overweight: Secondary | ICD-10-CM | POA: Diagnosis not present

## 2017-06-18 DIAGNOSIS — E039 Hypothyroidism, unspecified: Secondary | ICD-10-CM | POA: Diagnosis not present

## 2017-06-18 DIAGNOSIS — E119 Type 2 diabetes mellitus without complications: Secondary | ICD-10-CM | POA: Diagnosis not present

## 2017-06-25 DIAGNOSIS — E663 Overweight: Secondary | ICD-10-CM | POA: Diagnosis not present

## 2017-06-25 DIAGNOSIS — E039 Hypothyroidism, unspecified: Secondary | ICD-10-CM | POA: Diagnosis not present

## 2017-07-02 DIAGNOSIS — E663 Overweight: Secondary | ICD-10-CM | POA: Diagnosis not present

## 2017-07-02 DIAGNOSIS — E039 Hypothyroidism, unspecified: Secondary | ICD-10-CM | POA: Diagnosis not present

## 2017-07-02 DIAGNOSIS — E119 Type 2 diabetes mellitus without complications: Secondary | ICD-10-CM | POA: Diagnosis not present

## 2017-07-12 MED FILL — LEVOTHYROXINE 75 MCG TABLET: 75 | 90 days supply | Qty: 90 | Fill #1

## 2017-07-13 ENCOUNTER — Telehealth: Payer: Self-pay | Admitting: Emergency Medicine

## 2017-07-13 NOTE — Telephone Encounter (Signed)
PA completed for Trulicity- KEY [id number]. Awaiting response.

## 2017-07-16 DIAGNOSIS — Z713 Dietary counseling and surveillance: Secondary | ICD-10-CM | POA: Diagnosis not present

## 2017-07-16 DIAGNOSIS — E039 Hypothyroidism, unspecified: Secondary | ICD-10-CM | POA: Diagnosis not present

## 2017-07-16 DIAGNOSIS — E782 Mixed hyperlipidemia: Secondary | ICD-10-CM | POA: Diagnosis not present

## 2017-07-16 DIAGNOSIS — E663 Overweight: Secondary | ICD-10-CM | POA: Diagnosis not present

## 2017-07-16 DIAGNOSIS — E119 Type 2 diabetes mellitus without complications: Secondary | ICD-10-CM | POA: Diagnosis not present

## 2017-07-17 MED FILL — TRULICITY 0.75 MG/0.5 ML PE: 0.75 | 84 days supply | Qty: 6 | Fill #0

## 2017-07-17 NOTE — Telephone Encounter (Signed)
PA approved. Informed pharmacy via fax.

## 2017-07-22 DIAGNOSIS — Z713 Dietary counseling and surveillance: Secondary | ICD-10-CM | POA: Diagnosis not present

## 2017-07-22 DIAGNOSIS — E663 Overweight: Secondary | ICD-10-CM | POA: Diagnosis not present

## 2017-07-22 DIAGNOSIS — E782 Mixed hyperlipidemia: Secondary | ICD-10-CM | POA: Diagnosis not present

## 2017-08-13 DIAGNOSIS — E663 Overweight: Secondary | ICD-10-CM | POA: Diagnosis not present

## 2017-08-13 DIAGNOSIS — Z713 Dietary counseling and surveillance: Secondary | ICD-10-CM | POA: Diagnosis not present

## 2017-08-13 DIAGNOSIS — E119 Type 2 diabetes mellitus without complications: Secondary | ICD-10-CM | POA: Diagnosis not present

## 2017-08-13 DIAGNOSIS — E782 Mixed hyperlipidemia: Secondary | ICD-10-CM | POA: Diagnosis not present

## 2017-09-10 ENCOUNTER — Ambulatory Visit: Payer: 59 | Admitting: Sports Medicine

## 2017-09-13 MED FILL — SERTRALINE HCL 100 MG TAB: 100 | 90 days supply | Qty: 90 | Fill #1

## 2017-10-20 NOTE — Patient Instructions (Signed)
Test(s) ordered today. Your results will be released to [place] (or called to you) after review, usually within 72hours after test completion. If any changes need to be made, you will be notified at that same time.  All other Health Maintenance issues reviewed.   All recommended immunizations and age-appropriate screenings are up-to-date or discussed.  No immunizations administered today.   Medications reviewed and updated.  Changes include  /  No changes recommended at this time.  Your prescription(s) have been submitted to your pharmacy. Please take as directed and contact our office if you believe you are having problem(s) with the medication(s).  A referral was ordered for   Please followup in 6 months   Health Maintenance, Female Adopting a healthy lifestyle and getting preventive care can go a long way to promote health and wellness. Talk with your health care provider about what schedule of regular examinations is right for you. This is a good chance for you to check in with your provider about disease prevention and staying healthy. In between checkups, there are plenty of things you can do on your own. Experts have done a lot of research about which lifestyle changes and preventive measures are most likely to keep you healthy. Ask your health care provider for more information. Weight and diet Eat a healthy diet  Be sure to include plenty of vegetables, fruits, low-fat dairy products, and lean protein.  Do not eat a lot of foods high in solid fats, added sugars, or salt.  Get regular exercise. This is one of the most important things you can do for your health. ? Most adults should exercise for at least 150 minutes each week. The exercise should increase your heart rate and make you sweat (moderate-intensity exercise). ? Most adults should also do strengthening exercises at least twice a week. This is in addition to the moderate-intensity exercise.  Maintain a healthy  weight  Body mass index (BMI) is a measurement that can be used to identify possible weight problems. It estimates body fat based on height and weight. Your health care provider can help determine your BMI and help you achieve or maintain a healthy weight.  For females 62 years of age and older: ? A BMI below 18.5 is considered underweight. ? A BMI of 18.5 to 24.9 is normal. ? A BMI of 25 to 29.9 is considered overweight. ? A BMI of 30 and above is considered obese.  Watch levels of cholesterol and blood lipids  You should start having your blood tested for lipids and cholesterol at 62 years of age, then have this test every 5 years.  You may need to have your cholesterol levels checked more often if: ? Your lipid or cholesterol levels are high. ? You are older than 62 years of age. ? You are at high risk for heart disease.  Cancer screening Lung Cancer  Lung cancer screening is recommended for adults 62-68 years old who are at high risk for lung cancer because of a history of smoking.  A yearly low-dose CT scan of the lungs is recommended for people who: ? Currently smoke. ? Have quit within the past 15 years. ? Have at least a 30-pack-year history of smoking. A pack year is smoking an average of one pack of cigarettes a day for 1 year.  Yearly screening should continue until it has been 15 years since you quit.  Yearly screening should stop if you develop a health problem that would prevent you from  having lung cancer treatment.  Breast Cancer  Practice breast self-awareness. This means understanding how your breasts normally appear and feel.  It also means doing regular breast self-exams. Let your health care provider know about any changes, no matter how small.  If you are in your 62s or 30s, you should have a clinical breast exam (CBE) by a health care provider every 1-3 years as part of a regular health exam.  If you are 62 or older, have a CBE every year. Also consider  having a breast X-ray (mammogram) every year.  If you have a family history of breast cancer, talk to your health care provider about genetic screening.  If you are at high risk for breast cancer, talk to your health care provider about having an MRI and a mammogram every year.  Breast cancer gene (BRCA) assessment is recommended for women who have family members with BRCA-related cancers. BRCA-related cancers include: ? Breast. ? Ovarian. ? Tubal. ? Peritoneal cancers.  Results of the assessment will determine the need for genetic counseling and BRCA1 and BRCA2 testing.  Cervical Cancer Your health care provider may recommend that you be screened regularly for cancer of the pelvic organs (ovaries, uterus, and vagina). This screening involves a pelvic examination, including checking for microscopic changes to the surface of your cervix (Pap test). You may be encouraged to have this screening done every 3 years, beginning at age 62.  For women ages 79-62, health care providers may recommend pelvic exams and Pap testing every 3 years, or they may recommend the Pap and pelvic exam, combined with testing for human papilloma virus (HPV), every 5 years. Some types of HPV increase your risk of cervical cancer. Testing for HPV may also be done on women of any age with unclear Pap test results.  Other health care providers may not recommend any screening for nonpregnant women who are considered low risk for pelvic cancer and who do not have symptoms. Ask your health care provider if a screening pelvic exam is right for you.  If you have had past treatment for cervical cancer or a condition that could lead to cancer, you need Pap tests and screening for cancer for at least 20 years after your treatment. If Pap tests have been discontinued, your risk factors (such as having a new sexual partner) need to be reassessed to determine if screening should resume. Some women have medical problems that increase  the chance of getting cervical cancer. In these cases, your health care provider may recommend more frequent screening and Pap tests.  Colorectal Cancer  This type of cancer can be detected and often prevented.  Routine colorectal cancer screening usually begins at 62 years of age and continues through 62 years of age.  Your health care provider may recommend screening at an earlier age if you have risk factors for colon cancer.  Your health care provider may also recommend using home test kits to check for hidden blood in the stool.  A small camera at the end of a tube can be used to examine your colon directly (sigmoidoscopy or colonoscopy). This is done to check for the earliest forms of colorectal cancer.  Routine screening usually begins at age 58.  Direct examination of the colon should be repeated every 5-10 years through 62 years of age. However, you may need to be screened more often if early forms of precancerous polyps or small growths are found.  Skin Cancer  Check your skin from head  to toe regularly.  Tell your health care provider about any new moles or changes in moles, especially if there is a change in a mole's shape or color.  Also tell your health care provider if you have a mole that is larger than the size of a pencil eraser.  Always use sunscreen. Apply sunscreen liberally and repeatedly throughout the day.  Protect yourself by wearing long sleeves, pants, a wide-brimmed hat, and sunglasses whenever you are outside.  Heart disease, diabetes, and high blood pressure  High blood pressure causes heart disease and increases the risk of stroke. High blood pressure is more likely to develop in: ? People who have blood pressure in the high end of the normal range (130-139/85-89 mm Hg). ? People who are overweight or obese. ? People who are African American.  If you are 12-80 years of age, have your blood pressure checked every 3-5 years. If you are 74 years of age  or older, have your blood pressure checked every year. You should have your blood pressure measured twice-once when you are at a hospital or clinic, and once when you are not at a hospital or clinic. Record the average of the two measurements. To check your blood pressure when you are not at a hospital or clinic, you can use: ? An automated blood pressure machine at a pharmacy. ? A home blood pressure monitor.  If you are between 6 years and 39 years old, ask your health care provider if you should take aspirin to prevent strokes.  Have regular diabetes screenings. This involves taking a blood sample to check your fasting blood sugar level. ? If you are at a normal weight and have a low risk for diabetes, have this test once every three years after 62 years of age. ? If you are overweight and have a high risk for diabetes, consider being tested at a younger age or more often. Preventing infection Hepatitis B  If you have a higher risk for hepatitis B, you should be screened for this virus. You are considered at high risk for hepatitis B if: ? You were born in a country where hepatitis B is common. Ask your health care provider which countries are considered high risk. ? Your parents were born in a high-risk country, and you have not been immunized against hepatitis B (hepatitis B vaccine). ? You have HIV or AIDS. ? You use needles to inject street drugs. ? You live with someone who has hepatitis B. ? You have had sex with someone who has hepatitis B. ? You get hemodialysis treatment. ? You take certain medicines for conditions, including cancer, organ transplantation, and autoimmune conditions.  Hepatitis C  Blood testing is recommended for: ? Everyone born from [birth year] through 1965. ? Anyone with known risk factors for hepatitis C.  Sexually transmitted infections (STIs)  You should be screened for sexually transmitted infections (STIs) including gonorrhea and chlamydia if: ? You are  sexually active and are younger than 62 years of age. ? You are older than 62 years of age and your health care provider tells you that you are at risk for this type of infection. ? Your sexual activity has changed since you were last screened and you are at an increased risk for chlamydia or gonorrhea. Ask your health care provider if you are at risk.  If you do not have HIV, but are at risk, it may be recommended that you take a prescription medicine daily to prevent HIV  infection. This is called pre-exposure prophylaxis (PrEP). You are considered at risk if: ? You are sexually active and do not regularly use condoms or know the HIV status of your partner(s). ? You take drugs by injection. ? You are sexually active with a partner who has HIV.  Talk with your health care provider about whether you are at high risk of being infected with HIV. If you choose to begin PrEP, you should first be tested for HIV. You should then be tested every 3 months for as long as you are taking PrEP. Pregnancy  If you are premenopausal and you may become pregnant, ask your health care provider about preconception counseling.  If you may become pregnant, take 400 to 800 micrograms (mcg) of folic acid every day.  If you want to prevent pregnancy, talk to your health care provider about birth control (contraception). Osteoporosis and menopause  Osteoporosis is a disease in which the bones lose minerals and strength with aging. This can result in serious bone fractures. Your risk for osteoporosis can be identified using a bone density scan.  If you are 26 years of age or older, or if you are at risk for osteoporosis and fractures, ask your health care provider if you should be screened.  Ask your health care provider whether you should take a calcium or vitamin D supplement to lower your risk for osteoporosis.  Menopause may have certain physical symptoms and risks.  Hormone replacement therapy may reduce some  of these symptoms and risks. Talk to your health care provider about whether hormone replacement therapy is right for you. Follow these instructions at home:  Schedule regular health, dental, and eye exams.  Stay current with your immunizations.  Do not use any tobacco products including cigarettes, chewing tobacco, or electronic cigarettes.  If you are pregnant, do not drink alcohol.  If you are breastfeeding, limit how much and how often you drink alcohol.  Limit alcohol intake to no more than 1 drink per day for nonpregnant women. One drink equals 12 ounces of beer, 5 ounces of wine, or 1 ounces of hard liquor.  Do not use street drugs.  Do not share needles.  Ask your health care provider for help if you need support or information about quitting drugs.  Tell your health care provider if you often feel depressed.  Tell your health care provider if you have ever been abused or do not feel safe at home. This information is not intended to replace advice given to you by your health care provider. Make sure you discuss any questions you have with your health care provider. Document Released: 11/20/2010 Document Revised: 10/13/2015 Document Reviewed: 02/08/2015 Elsevier Interactive Patient Education  [employer].

## 2017-10-20 NOTE — Progress Notes (Signed)
Subjective:    Patient ID: Cheryl Chan, female    DOB: 1956/03/13, 62 y.o.   MRN: [id number]  HPI She is here for a physical exam.     Medications and allergies reviewed with patient and updated if appropriate.  Patient Active Problem List   Diagnosis Date Noted  . Osteoarthritis 04/22/2017  . Onychomycosis of toenail 09/25/2016  . Overweight 01/25/2015  . Rotator cuff injury   . Uterine cancer ([place]) 11/12/2011  . Diabetes ([place])   . Hypothyroidism   . Hyperlipidemia   . Depression     Current Outpatient Medications on File Prior to Visit  Medication Sig Dispense Refill  . atorvastatin (LIPITOR) 10 MG tablet Take 0.5 tablets (5 mg total) by mouth daily. (Patient not taking: Reported on 04/23/2017) 30 tablet 5  . Dulaglutide (TRULICITY) 1.95 KD/3.2IZ SOPN Inject 0.75 mg into the skin once a week. (Patient not taking: Reported on 04/23/2017) 12 pen 3  . fluticasone (FLONASE) 50 MCG/ACT nasal spray PLACE 2 SPRAYS INTO BOTH NOSTRILS DAILY. 48 g 2  . glucose blood (FREESTYLE LITE) test strip Use as instructed 100 each 12  . Insulin Pen Needle (BD ULTRA-FINE MICRO PEN NEEDLE) 32G X 6 MM MISC UAD daily with victoza 90 each 3  . Lancets (FREESTYLE) lancets Use as instructed 100 each 12  . levothyroxine (SYNTHROID, LEVOTHROID) 75 MCG tablet Take 1 tablet (75 mcg total) by mouth daily. 90 tablet 1  . sertraline (ZOLOFT) 100 MG tablet Take 1 tablet (100 mg total) by mouth daily. 90 tablet 1  . VICTOZA 18 MG/3ML SOPN INJECT 1 8 MG TOTAL INTO THE SKIN DAILY  3   No current facility-administered medications on file prior to visit.     Past Medical History:  Diagnosis Date  . Anxiety   . Arthritis   . Complex endometrial hyperplasia 2005  . 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 Diet-controlled type 2 diabetes mellitus ([place])   . Endometrial cancer ([place]) 10/2011 dx   s/p vag hyst 11/2011  . History of chicken pox   . Hyperlipidemia   . Hypothyroidism   . Positive  TB test    CXRs ok  . Rotator cuff injury     Past Surgical History:  Procedure Laterality Date  . ABDOMINAL HYSTERECTOMY    . CESAREAN SECTION     x's 4  . DILATION AND CURETTAGE OF UTERUS     hysteroscopy  . FRACTURE SURGERY     nose as a child  . OPEN REDUCTION INTERNAL FIXATION (ORIF) DISTAL PHALANX Left 08/05/2012   Procedure: OPEN REDUCTION INTERNAL FIXATION (ORIF) DISTAL PHALANX VERSUS EXTERNAL FIXATION LEFT RING FINGER;  Surgeon: Tennis Must, MD;  Location: [facility];  Service: Orthopedics;  Laterality: Left;  . ROBOTIC ASSISTED LAP VAGINAL HYSTERECTOMY  12/04/2011   Procedure: ROBOTIC ASSISTED LAPAROSCOPIC VAGINAL HYSTERECTOMY;  Surgeon: Imagene Gurney A. Alycia Rossetti, MD;  Location: [facility];  Service: Gynecology;  Laterality: N/A;  . SHOULDER ARTHROSCOPY WITH ROTATOR CUFF REPAIR AND SUBACROMIAL DECOMPRESSION Left 11/27/2013   Procedure: LEFT SHOULDER ARTHROSCOPY, SUBACROMIAL DECOMPRESSION, PARTIAL ACROMIOPLASTY WITH  CORACOACROMIAL RELEASE, DISTAL CLAVICULECTOMY WITH ROTATOR CUFF REPAIR AND EXTENSIVE DEBRIDEMENT;  Surgeon: Lorn Junes, MD;  Location: [facility];  Service: Orthopedics;  Laterality: Left;    Social History   Socioeconomic History  . Marital status: Married    Spouse name: Not on file  . Number of children: Not on file  . Years  of education: Not on file  . Highest education level: Not on file  Occupational History  . Not on file  Social Needs  . Financial resource strain: Not on file  . Food insecurity:    Worry: Not on file    Inability: Not on file  . Transportation needs:    Medical: Not on file    Non-medical: Not on file  Tobacco Use  . Smoking status: Never Smoker  . Smokeless tobacco: Never Used  Substance and Sexual Activity  . Alcohol use: Yes    Alcohol/week: 0.0 - 0.6 oz    Comment: social  . Drug use: No  . Sexual activity: Never    Partners: Male    Birth control/protection: Surgical    Comment: hysterectomy    Lifestyle  . Physical activity:    Days per week: Not on file    Minutes per session: Not on file  . Stress: Not on file  Relationships  . Social connections:    Talks on phone: Not on file    Gets together: Not on file    Attends religious service: Not on file    Active member of club or organization: Not on file    Attends meetings of clubs or organizations: Not on file    Relationship status: Not on file  Other Topics Concern  . 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 Arthritis Mother   . Cancer Father 47       prostrate  . Hypertension Father   . Stroke Father        in setting of sepsis f/ RMSF  . Heart disease Brother 65       cardiomyopathy s/p ICD, on transplant list    Review of Systems     Objective:  There were no vitals filed for this visit. There were no vitals filed for this visit. There is no height or weight on file to calculate BMI.  Wt Readings from Last 3 Encounters:  04/23/17 160 lb (72.6 kg)  04/22/17 158 lb (71.7 kg)  03/12/17 157 lb (71.2 kg)     Physical Exam Constitutional: She appears well-developed and well-nourished. No distress.  HENT:  Head: Normocephalic and atraumatic.  Right Ear: External ear normal. Normal ear canal and TM Left Ear: External ear normal.  Normal ear canal and TM Mouth/Throat: Oropharynx is clear and moist.  Eyes: Conjunctivae and EOM are normal.  Neck: Neck supple. No tracheal deviation present. No thyromegaly present.  No carotid bruit  Cardiovascular: Normal rate, regular rhythm and normal heart sounds.   No murmur heard.  No edema. Pulmonary/Chest: Effort normal and breath sounds normal. No respiratory distress. She has no wheezes. She has no rales.  Breast: deferred to  Gyn Abdominal: Soft. She exhibits no distension. There is no tenderness.  Lymphadenopathy: She has no cervical adenopathy.  Skin: Skin is warm and dry. She is not diaphoretic.  Psychiatric: She has a normal mood and affect. Her behavior is normal.        Assessment & Plan:   Physical exam: Screening blood work  ordered Immunizations  Discussed shingrix, others up to date Colonoscopy   Up to  date  Mammogram   Up to date  Gyn Dexa Eye exams EKG   Last done 09/2012 Exercise Weight Skin  Substance abuse   none  See Problem List for Assessment and Plan of chronic medical problems.       This encounter was created in error - please disregard.

## 2017-10-22 ENCOUNTER — Other Ambulatory Visit: Payer: Self-pay | Admitting: Internal Medicine

## 2017-10-22 ENCOUNTER — Encounter: Payer: 59 | Admitting: Internal Medicine

## 2017-10-22 MED FILL — VICTOZA 18 MG/3 ML INJECT P: 18 | 30 days supply | Qty: 9 | Fill #0

## 2017-11-21 ENCOUNTER — Other Ambulatory Visit: Payer: Self-pay | Admitting: Internal Medicine

## 2017-11-25 MED FILL — LEVOTHYROXINE 75 MCG TABLET: 75 | 90 days supply | Qty: 90 | Fill #0

## 2017-12-15 ENCOUNTER — Other Ambulatory Visit: Payer: Self-pay | Admitting: Internal Medicine

## 2017-12-16 MED FILL — SERTRALINE HCL 100 MG TAB: 100 | 90 days supply | Qty: 90 | Fill #0

## 2017-12-19 ENCOUNTER — Encounter: Payer: Self-pay | Admitting: Sports Medicine

## 2017-12-19 ENCOUNTER — Ambulatory Visit
Admission: RE | Admit: 2017-12-19 | Discharge: 2017-12-19 | Disposition: A | Discharge status: Home / Self Care | Payer: 59 | Source: Ambulatory Visit | Attending: Sports Medicine | Admitting: Sports Medicine

## 2017-12-19 ENCOUNTER — Ambulatory Visit (INDEPENDENT_AMBULATORY_CARE_PROVIDER_SITE_OTHER): Payer: 59 | Admitting: Sports Medicine

## 2017-12-19 VITALS — BP 126/78 | Ht 62.0 in | Wt 153.0 lb

## 2017-12-19 DIAGNOSIS — G8929 Other chronic pain: Secondary | ICD-10-CM | POA: Diagnosis not present

## 2017-12-19 DIAGNOSIS — M25511 Pain in right shoulder: Secondary | ICD-10-CM

## 2017-12-19 DIAGNOSIS — M19041 Primary osteoarthritis, right hand: Secondary | ICD-10-CM | POA: Diagnosis not present

## 2017-12-19 DIAGNOSIS — M19042 Primary osteoarthritis, left hand: Secondary | ICD-10-CM | POA: Diagnosis not present

## 2017-12-19 MED ORDER — METHYLPREDNISOLONE ACETATE 40 MG/ML IJ SUSP
40.0000 mg | Freq: Once | INTRAMUSCULAR | Status: AC
Start: 2017-12-19 — End: 2017-12-19
  Administered 2017-12-19: 40 mg via INTRA_ARTICULAR

## 2017-12-19 NOTE — Progress Notes (Signed)
Cheryl Chan - 62 y.o. female MRN [id number]  Date of birth: 1955-08-31    SUBJECTIVE:      Chief Complaint:/ HPI:  62 year old female presents with right shoulder pain worsening over the last 6 months.  Patient is a history of issues with her right shoulder and about 4 years ago underwent right subacromial decompression and rotator cuff repair.  Patient notes recently increased pain with any overhead movement.  No known mechanism of injury at the onset of her pain at this time.  She localizes her pain to the anterior lateral shoulder describes it is more of a constant ache that radiates down into the arm.  In addition to pain with overhead movement she also feels some associated weakness in the hand.  This is affected her hobby of fracture repair.  She denies any crepitus in the shoulder.  She gets relief with Tylenol. Denies numbness or tingling in the distal extremity.  She denies any skin changes. No swelling, erythema, or bruising she does have arthritis in the hands bilaterally.   ROS:     See HPI  PERTINENT  PMH / PSH FH / / SH:  Past Medical, Surgical, Social, and Family History Reviewed & Updated in the EMR.  Pertinent findings include:  Diabetes  OBJECTIVE: BP 126/78   Ht 5\' 2"  (1.575 m)   Wt 153 lb (69.4 kg)   LMP 05/22/2003   BMI 27.98 kg/m   Physical Exam:  Vital signs are reviewed.  GEN: Alert and oriented, NAD Pulm: Breathing unlabored PSY: normal mood, congruent affect  MSK: Right Shoulder: No obvious deformity or asymmetry. No bruising. No swelling Very mild tenderness over the anterior shoulder.  No tenderness over the [facility] joint Full ROM in flexion, abduction, internal/external rotation.  Pain reproduced with at endrange motion with abduction and forward flexion NV intact distally Special Tests:  - Impingement: Neg Hawkins, mild discomfort with Neers.  - Supraspinatous: Negative empty can. Strength normal/symmetric - Infraspinatous/Teres: Negative external  rotation lag. Strength normal/symmetric - Subscapularis: negative belly press and bear hug. Strength normal/symmetric - Biceps tendon: Negative Speeds.  - Labrum: Negative Obriens.  - AC Joint: No TTP. Negative cross arm   Left Shoulder: No obvious deformity or asymmetry. No bruising. No swelling No TTP. Full ROM in flexion, abduction, internal/external rotation NV intact distally.  Equal strength bilaterally   Bilateral hands demonstrate degenerative arthritic changes in the DIP and PIP joints.   ASSESSMENT & PLAN:  1. Right shoulder pain -I suspect her right shoulder pain is due to rotator cuff tendinopathy +/- some mild impingement versus glenohumeral osteoarthritis.  As of right now favor the diagnosis of rotator cuff tendinopathy. - Subacromial steroid injection performed today as described below.  Patient tolerated well.  This will be both diagnostic and therapeutic for her. - We will obtain x-rays of the shoulder to evaluate for glenohumeral arthritis. - Patient was instructed on rotator cuff strengthening exercises today and provided with Thera-Band. - She will follow-up in about 4 weeks   2. Bilateral hand arthritis -patient nonspecific weakness hands likely due to arthritis.  She has obvious degenerative changes at the PIP and DIP bilaterally.  Will order x-rays of the right hand to evaluate the extent of her arthritis.    Procedure performed: subacromial corticosteroid injection; palpation guided Complications:  none Performed by: Coralyn Helling, DO Procedure note:  Consent obtained and verified. Time-out conducted. Noted no overlying erythema, induration, or other signs of local infection. The  right posterior  subacromial space was palpated and marked. The over overlying skin was prepped prepped in a sterile fashion. Topical analgesic spray: Ethyl chloride. Joint: Right subacromial Needle: 25-gauge 1 1/2 inch Completed without difficulty. Meds: Depo-Medrol 40 mg  (67m), Marcaine 3 mL  Advised to call if fevers/chills, erythema, induration, drainage, or persistent bleeding.

## 2018-01-16 ENCOUNTER — Encounter: Payer: Self-pay | Admitting: Sports Medicine

## 2018-01-16 ENCOUNTER — Ambulatory Visit (INDEPENDENT_AMBULATORY_CARE_PROVIDER_SITE_OTHER): Payer: 59 | Admitting: Sports Medicine

## 2018-01-16 VITALS — BP 112/62 | Ht 62.0 in | Wt 155.0 lb

## 2018-01-16 DIAGNOSIS — M67911 Unspecified disorder of synovium and tendon, right shoulder: Secondary | ICD-10-CM

## 2018-01-16 NOTE — Progress Notes (Signed)
  Cheryl Chan - 62 y.o. female MRN [id number]  Date of birth: [birth year]    SUBJECTIVE:      Chief Complaint:/ HPI:  62 year old female returns for follow-up for right shoulder rotator cuff tendinopathy.  She reports she is doing much better today following a subacromial injection 4 weeks ago.  She notes only very intermittent discomfort with range of motion and some mild subjective weakness.  She has been doing home exercise strengthening.  Her son is a physical therapist and she plans to work with him for more strengthening.  She also notes that her hand pain due to arthritis has improved since the subacromial injection as well   ROS:     She denies any swelling, erythema, bruising, or skin changes  PERTINENT  PMH / PSH FH / / SH:  Past Medical, Surgical, Social, and Family History Reviewed & Updated in the EMR.    OBJECTIVE: LMP 05/22/2003   Physical Exam:  Vital signs are reviewed.  GEN: Alert and oriented, NAD Pulm: Breathing unlabored PSY: normal mood, congruent affect  MSK: Right shoulder: No obvious deformity or asymmetry. No bruising. No swelling No TTP. Full ROM in flexion, abduction, internal/external rotation NV intact distally Special Tests:  - Impingement: Neg [place] and [place].  - Supraspinatous: Negative empty can. Strength normal/symmetric - Infraspinatous/Teres: Strength normal/symmetric - Subscapularis: negative belly pressStrength normal/symmetric    ASSESSMENT & PLAN:  1.  Right rotator cuff tendinopathy- symptoms significantly improved following subacromial steroid injection.  At last visit x-rays were obtained of the right shoulder and hand.  These x-rays were independently reviewed today.  Overall her shoulder shows no significant bony abnormalities or glenohumeral arthritis.  Her hand x-rays demonstrate some DIP and PIP arthritis. - She will continue to do home exercises as well as exercises provided by her son who is a physical therapist -I discussed  with her the possibility of formal physical therapy referral if she feels as though she needs additional help with some strengthening -Follow-up as needed

## 2018-01-20 NOTE — Progress Notes (Signed)
Subjective:    Patient ID: Cheryl Chan, female    DOB: March 18, 1956, 62 y.o.   MRN: [id number]  HPI She is here for a physical exam.     Medications and allergies reviewed with patient and updated if appropriate.  Patient Active Problem List   Diagnosis Date Noted  . Osteoarthritis 04/22/2017  . Onychomycosis of toenail 09/25/2016  . Overweight 01/25/2015  . Rotator cuff injury   . Uterine cancer ([place]) 11/12/2011  . Diabetes ([place])   . Hypothyroidism   . Hyperlipidemia   . Depression     Current Outpatient Medications on File Prior to Visit  Medication Sig Dispense Refill  . atorvastatin (LIPITOR) 10 MG tablet Take 0.5 tablets (5 mg total) by mouth daily. 30 tablet 5  . fluticasone (FLONASE) 50 MCG/ACT nasal spray PLACE 2 SPRAYS INTO BOTH NOSTRILS DAILY. 48 g 2  . glucose blood (FREESTYLE LITE) test strip Use as instructed 100 each 12  . Insulin Pen Needle (BD ULTRA-FINE MICRO PEN NEEDLE) 32G X 6 MM MISC UAD daily with victoza 90 each 3  . Lancets (FREESTYLE) lancets Use as instructed 100 each 12  . levothyroxine (SYNTHROID, LEVOTHROID) 75 MCG tablet TAKE 1 TABLET BY MOUTH DAILY 90 tablet 0  . sertraline (ZOLOFT) 100 MG tablet TAKE 1 TABLET (100 MG TOTAL) BY MOUTH DAILY. 90 tablet 1   No current facility-administered medications on file prior to visit.     Past Medical History:  Diagnosis Date  . Anxiety   . Arthritis   . Complex endometrial hyperplasia 2005  . 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 Diet-controlled type 2 diabetes mellitus ([place])   . Endometrial cancer ([place]) 10/2011 dx   s/p vag hyst 11/2011  . History of chicken pox   . Hyperlipidemia   . Hypothyroidism   . 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 Rotator cuff injury     Past Surgical History:  Procedure Laterality Date  . ABDOMINAL HYSTERECTOMY    . CESAREAN SECTION     x's 4  . DILATION AND CURETTAGE OF UTERUS     hysteroscopy  . FRACTURE SURGERY     nose as a child  . OPEN  REDUCTION INTERNAL FIXATION (ORIF) DISTAL PHALANX Left 08/05/2012   Procedure: OPEN REDUCTION INTERNAL FIXATION (ORIF) DISTAL PHALANX VERSUS EXTERNAL FIXATION LEFT RING FINGER;  Surgeon: Tennis Must, MD;  Location: [facility];  Service: Orthopedics;  Laterality: Left;  . ROBOTIC ASSISTED LAP VAGINAL HYSTERECTOMY  12/04/2011   Procedure: ROBOTIC ASSISTED LAPAROSCOPIC VAGINAL HYSTERECTOMY;  Surgeon: Imagene Gurney A. Alycia Rossetti, MD;  Location: [facility];  Service: Gynecology;  Laterality: N/A;  . SHOULDER ARTHROSCOPY WITH ROTATOR CUFF REPAIR AND SUBACROMIAL DECOMPRESSION Left 11/27/2013   Procedure: LEFT SHOULDER ARTHROSCOPY, SUBACROMIAL DECOMPRESSION, PARTIAL ACROMIOPLASTY WITH  CORACOACROMIAL RELEASE, DISTAL CLAVICULECTOMY WITH ROTATOR CUFF REPAIR AND EXTENSIVE DEBRIDEMENT;  Surgeon: Lorn Junes, MD;  Location: [facility];  Service: Orthopedics;  Laterality: Left;    Social History   Socioeconomic History  . Marital status: Married    Spouse name: Not on file  . Number of children: Not on file  . Years of education: Not on file  . Highest education level: Not on file  Occupational History  . Not on file  Social Needs  . Financial resource strain: Not on file  . Food insecurity:    Worry: Not on file    Inability: Not on file  .  Transportation needs:    Medical: Not on file    Non-medical: Not on file  Tobacco Use  . Smoking status: Never Smoker  . Smokeless tobacco: Never Used  Substance and Sexual Activity  . Alcohol use: Yes    Alcohol/week: 0.0 - 1.0 standard drinks    Comment: social  . Drug use: No  . Sexual activity: Never    Partners: Male    Birth control/protection: Surgical    Comment: hysterectomy  Lifestyle  . Physical activity:    Days per week: Not on file    Minutes per session: Not on file  . Stress: Not on file  Relationships  . Social connections:    Talks on phone: Not on file    Gets together: Not on file    Attends religious  service: Not on file    Active member of club or organization: Not on file    Attends meetings of clubs or organizations: Not on file    Relationship status: Not on file  Other Topics Concern  . 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 Arthritis Mother   . Cancer Father 93       prostrate  . Hypertension Father   . Stroke Father        in setting of sepsis f/ RMSF  . Heart disease Brother 66       cardiomyopathy s/p ICD, on transplant list    Review of Systems     Objective:  There were no vitals filed for this visit. There were no vitals filed for this visit. There is no height or weight on file to calculate BMI.  Wt Readings from Last 3 Encounters:  01/16/18 155 lb (70.3 kg)  12/19/17 153 lb (69.4 kg)  04/23/17 160 lb (72.6 kg)     Physical Exam      Assessment & Plan:        This encounter was created in error - please disregard.

## 2018-01-22 ENCOUNTER — Encounter: Payer: 59 | Admitting: Internal Medicine

## 2018-01-22 DIAGNOSIS — Z0289 Encounter for other administrative examinations: Secondary | ICD-10-CM

## 2018-01-23 DIAGNOSIS — S60051A Contusion of right little finger without damage to nail, initial encounter: Secondary | ICD-10-CM | POA: Diagnosis not present

## 2018-01-23 DIAGNOSIS — M79644 Pain in right finger(s): Secondary | ICD-10-CM | POA: Diagnosis not present

## 2018-02-07 MED FILL — FLUARIX QUADRIVALENT 0.5 ML: 0.5 | 1 days supply | Qty: 1 | Fill #0

## 2018-02-12 ENCOUNTER — Encounter: Payer: 59 | Admitting: Internal Medicine

## 2018-02-28 ENCOUNTER — Other Ambulatory Visit: Payer: Self-pay | Admitting: Internal Medicine

## 2018-03-19 ENCOUNTER — Ambulatory Visit: Payer: 59 | Admitting: Certified Nurse Midwife

## 2018-03-19 ENCOUNTER — Encounter

## 2018-03-21 MED FILL — SERTRALINE HCL 100 MG TAB: 100 | 90 days supply | Qty: 90 | Fill #1

## 2018-03-25 NOTE — Patient Instructions (Addendum)
Other medication options - farxiga / jardiance,  januvia/onglyza   Tests ordered today. Your results will be released to [place] (or called to you) after review, usually within 72hours after test completion. If any changes need to be made, you will be notified at that same time.  All other Health Maintenance issues reviewed.   All recommended immunizations and age-appropriate screenings are up-to-date or discussed.  No immunizations administered today.   Medications reviewed and updated.  Changes include :   none  Your prescription(s) have been submitted to your pharmacy. Please take as directed and contact our office if you believe you are having problem(s) with the medication(s).   Please followup in 6 months   Health Maintenance, Female Adopting a healthy lifestyle and getting preventive care can go a long way to promote health and wellness. Talk with your health care provider about what schedule of regular examinations is right for you. This is a good chance for you to check in with your provider about disease prevention and staying healthy. In between checkups, there are plenty of things you can do on your own. Experts have done a lot of research about which lifestyle changes and preventive measures are most likely to keep you healthy. Ask your health care provider for more information. Weight and diet Eat a healthy diet  Be sure to include plenty of vegetables, fruits, low-fat dairy products, and lean protein.  Do not eat a lot of foods high in solid fats, added sugars, or salt.  Get regular exercise. This is one of the most important things you can do for your health. ? Most adults should exercise for at least 150 minutes each week. The exercise should increase your heart rate and make you sweat (moderate-intensity exercise). ? Most adults should also do strengthening exercises at least twice a week. This is in addition to the moderate-intensity exercise.  Maintain a healthy  weight  Body mass index (BMI) is a measurement that can be used to identify possible weight problems. It estimates body fat based on height and weight. Your health care provider can help determine your BMI and help you achieve or maintain a healthy weight.  For females 28 years of age and older: ? A BMI below 18.5 is considered underweight. ? A BMI of 18.5 to 24.9 is normal. ? A BMI of 25 to 29.9 is considered overweight. ? A BMI of 30 and above is considered obese.  Watch levels of cholesterol and blood lipids  You should start having your blood tested for lipids and cholesterol at 62 years of age, then have this test every 5 years.  You may need to have your cholesterol levels checked more often if: ? Your lipid or cholesterol levels are high. ? You are older than 62 years of age. ? You are at high risk for heart disease.  Cancer screening Lung Cancer  Lung cancer screening is recommended for adults 79-19 years old who are at high risk for lung cancer because of a history of smoking.  A yearly low-dose CT scan of the lungs is recommended for people who: ? Currently smoke. ? Have quit within the past 15 years. ? Have at least a 30-pack-year history of smoking. A pack year is smoking an average of one pack of cigarettes a day for 1 year.  Yearly screening should continue until it has been 15 years since you quit.  Yearly screening should stop if you develop a health problem that would prevent you from  having lung cancer treatment.  Breast Cancer  Practice breast self-awareness. This means understanding how your breasts normally appear and feel.  It also means doing regular breast self-exams. Let your health care provider know about any changes, no matter how small.  If you are in your 20s or 30s, you should have a clinical breast exam (CBE) by a health care provider every 1-3 years as part of a regular health exam.  If you are 12 or older, have a CBE every year. Also consider  having a breast X-ray (mammogram) every year.  If you have a family history of breast cancer, talk to your health care provider about genetic screening.  If you are at high risk for breast cancer, talk to your health care provider about having an MRI and a mammogram every year.  Breast cancer gene (BRCA) assessment is recommended for women who have family members with BRCA-related cancers. BRCA-related cancers include: ? Breast. ? Ovarian. ? Tubal. ? Peritoneal cancers.  Results of the assessment will determine the need for genetic counseling and BRCA1 and BRCA2 testing.  Cervical Cancer Your health care provider may recommend that you be screened regularly for cancer of the pelvic organs (ovaries, uterus, and vagina). This screening involves a pelvic examination, including checking for microscopic changes to the surface of your cervix (Pap test). You may be encouraged to have this screening done every 3 years, beginning at age 42.  For women ages 79-65, health care providers may recommend pelvic exams and Pap testing every 3 years, or they may recommend the Pap and pelvic exam, combined with testing for human papilloma virus (HPV), every 5 years. Some types of HPV increase your risk of cervical cancer. Testing for HPV may also be done on women of any age with unclear Pap test results.  Other health care providers may not recommend any screening for nonpregnant women who are considered low risk for pelvic cancer and who do not have symptoms. Ask your health care provider if a screening pelvic exam is right for you.  If you have had past treatment for cervical cancer or a condition that could lead to cancer, you need Pap tests and screening for cancer for at least 20 years after your treatment. If Pap tests have been discontinued, your risk factors (such as having a new sexual partner) need to be reassessed to determine if screening should resume. Some women have medical problems that increase  the chance of getting cervical cancer. In these cases, your health care provider may recommend more frequent screening and Pap tests.  Colorectal Cancer  This type of cancer can be detected and often prevented.  Routine colorectal cancer screening usually begins at 62 years of age and continues through 62 years of age.  Your health care provider may recommend screening at an earlier age if you have risk factors for colon cancer.  Your health care provider may also recommend using home test kits to check for hidden blood in the stool.  A small camera at the end of a tube can be used to examine your colon directly (sigmoidoscopy or colonoscopy). This is done to check for the earliest forms of colorectal cancer.  Routine screening usually begins at age 58.  Direct examination of the colon should be repeated every 5-10 years through 62 years of age. However, you may need to be screened more often if early forms of precancerous polyps or small growths are found.  Skin Cancer  Check your skin from head  to toe regularly.  Tell your health care provider about any new moles or changes in moles, especially if there is a change in a mole's shape or color.  Also tell your health care provider if you have a mole that is larger than the size of a pencil eraser.  Always use sunscreen. Apply sunscreen liberally and repeatedly throughout the day.  Protect yourself by wearing long sleeves, pants, a wide-brimmed hat, and sunglasses whenever you are outside.  Heart disease, diabetes, and high blood pressure  High blood pressure causes heart disease and increases the risk of stroke. High blood pressure is more likely to develop in: ? People who have blood pressure in the high end of the normal range (130-139/85-89 mm Hg). ? People who are overweight or obese. ? People who are African American.  If you are 12-80 years of age, have your blood pressure checked every 3-5 years. If you are 74 years of age  or older, have your blood pressure checked every year. You should have your blood pressure measured twice-once when you are at a hospital or clinic, and once when you are not at a hospital or clinic. Record the average of the two measurements. To check your blood pressure when you are not at a hospital or clinic, you can use: ? An automated blood pressure machine at a pharmacy. ? A home blood pressure monitor.  If you are between 6 years and 39 years old, ask your health care provider if you should take aspirin to prevent strokes.  Have regular diabetes screenings. This involves taking a blood sample to check your fasting blood sugar level. ? If you are at a normal weight and have a low risk for diabetes, have this test once every three years after 62 years of age. ? If you are overweight and have a high risk for diabetes, consider being tested at a younger age or more often. Preventing infection Hepatitis B  If you have a higher risk for hepatitis B, you should be screened for this virus. You are considered at high risk for hepatitis B if: ? You were born in a country where hepatitis B is common. Ask your health care provider which countries are considered high risk. ? Your parents were born in a high-risk country, and you have not been immunized against hepatitis B (hepatitis B vaccine). ? You have HIV or AIDS. ? You use needles to inject street drugs. ? You live with someone who has hepatitis B. ? You have had sex with someone who has hepatitis B. ? You get hemodialysis treatment. ? You take certain medicines for conditions, including cancer, organ transplantation, and autoimmune conditions.  Hepatitis C  Blood testing is recommended for: ? Everyone born from [birth year] through 1965. ? Anyone with known risk factors for hepatitis C.  Sexually transmitted infections (STIs)  You should be screened for sexually transmitted infections (STIs) including gonorrhea and chlamydia if: ? You are  sexually active and are younger than 62 years of age. ? You are older than 62 years of age and your health care provider tells you that you are at risk for this type of infection. ? Your sexual activity has changed since you were last screened and you are at an increased risk for chlamydia or gonorrhea. Ask your health care provider if you are at risk.  If you do not have HIV, but are at risk, it may be recommended that you take a prescription medicine daily to prevent HIV  infection. This is called pre-exposure prophylaxis (PrEP). You are considered at risk if: ? You are sexually active and do not regularly use condoms or know the HIV status of your partner(s). ? You take drugs by injection. ? You are sexually active with a partner who has HIV.  Talk with your health care provider about whether you are at high risk of being infected with HIV. If you choose to begin PrEP, you should first be tested for HIV. You should then be tested every 3 months for as long as you are taking PrEP. Pregnancy  If you are premenopausal and you may become pregnant, ask your health care provider about preconception counseling.  If you may become pregnant, take 400 to 800 micrograms (mcg) of folic acid every day.  If you want to prevent pregnancy, talk to your health care provider about birth control (contraception). Osteoporosis and menopause  Osteoporosis is a disease in which the bones lose minerals and strength with aging. This can result in serious bone fractures. Your risk for osteoporosis can be identified using a bone density scan.  If you are 26 years of age or older, or if you are at risk for osteoporosis and fractures, ask your health care provider if you should be screened.  Ask your health care provider whether you should take a calcium or vitamin D supplement to lower your risk for osteoporosis.  Menopause may have certain physical symptoms and risks.  Hormone replacement therapy may reduce some  of these symptoms and risks. Talk to your health care provider about whether hormone replacement therapy is right for you. Follow these instructions at home:  Schedule regular health, dental, and eye exams.  Stay current with your immunizations.  Do not use any tobacco products including cigarettes, chewing tobacco, or electronic cigarettes.  If you are pregnant, do not drink alcohol.  If you are breastfeeding, limit how much and how often you drink alcohol.  Limit alcohol intake to no more than 1 drink per day for nonpregnant women. One drink equals 12 ounces of beer, 5 ounces of wine, or 1 ounces of hard liquor.  Do not use street drugs.  Do not share needles.  Ask your health care provider for help if you need support or information about quitting drugs.  Tell your health care provider if you often feel depressed.  Tell your health care provider if you have ever been abused or do not feel safe at home. This information is not intended to replace advice given to you by your health care provider. Make sure you discuss any questions you have with your health care provider. Document Released: 11/20/2010 Document Revised: 10/13/2015 Document Reviewed: 02/08/2015 Elsevier Interactive Patient Education  [employer].

## 2018-03-25 NOTE — Progress Notes (Signed)
Subjective:    Patient ID: Cheryl Chan, female    DOB: 30-Oct-1955, 62 y.o.   MRN: [id number]  HPI She is here for a physical exam.   She did one round of the 56 day diet.  She had good weight loss with that.  She is thinking about trying that again or doing a fasting diet.  She has gained weight and knows she needs to lose weight.  She did not tolerate metformin or victoza/trulicity. She wants to avoid medication and work on her lifestyle.  She has not been compliant with a low sugar / carb diet recently.      Medications and allergies reviewed with patient and updated if appropriate.  Patient Active Problem List   Diagnosis Date Noted  . Osteoarthritis 04/22/2017  . Onychomycosis of toenail 09/25/2016  . Overweight 01/25/2015  . Rotator cuff injury   . Uterine cancer ([place]) 11/12/2011  . Diabetes ([place])   . Hypothyroidism   . Hyperlipidemia   . Depression     Current Outpatient Medications on File Prior to Visit  Medication Sig Dispense Refill  . glucose blood (FREESTYLE LITE) test strip Use as instructed 100 each 12  . Lancets (FREESTYLE) lancets Use as instructed 100 each 12  . levothyroxine (SYNTHROID, LEVOTHROID) 75 MCG tablet TAKE 1 TABLET BY MOUTH DAILY 90 tablet 0  . sertraline (ZOLOFT) 100 MG tablet TAKE 1 TABLET (100 MG TOTAL) BY MOUTH DAILY. 90 tablet 1   No current facility-administered medications on file prior to visit.     Past Medical History:  Diagnosis Date  . Anxiety   . Arthritis   . Complex endometrial hyperplasia 2005  . 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 Diet-controlled type 2 diabetes mellitus ([place])   . Endometrial cancer ([place]) 10/2011 dx   s/p vag hyst 11/2011  . History of chicken pox   . Hyperlipidemia   . Hypothyroidism   . 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 Rotator cuff injury     Past Surgical History:  Procedure Laterality Date  . ABDOMINAL HYSTERECTOMY    . CESAREAN SECTION     x's 4  . DILATION AND CURETTAGE  OF UTERUS     hysteroscopy  . FRACTURE SURGERY     nose as a child  . OPEN REDUCTION INTERNAL FIXATION (ORIF) DISTAL PHALANX Left 08/05/2012   Procedure: OPEN REDUCTION INTERNAL FIXATION (ORIF) DISTAL PHALANX VERSUS EXTERNAL FIXATION LEFT RING FINGER;  Surgeon: Tennis Must, MD;  Location: [facility];  Service: Orthopedics;  Laterality: Left;  . ROBOTIC ASSISTED LAP VAGINAL HYSTERECTOMY  12/04/2011   Procedure: ROBOTIC ASSISTED LAPAROSCOPIC VAGINAL HYSTERECTOMY;  Surgeon: Imagene Gurney A. Alycia Rossetti, MD;  Location: [facility];  Service: Gynecology;  Laterality: N/A;  . SHOULDER ARTHROSCOPY WITH ROTATOR CUFF REPAIR AND SUBACROMIAL DECOMPRESSION Left 11/27/2013   Procedure: LEFT SHOULDER ARTHROSCOPY, SUBACROMIAL DECOMPRESSION, PARTIAL ACROMIOPLASTY WITH  CORACOACROMIAL RELEASE, DISTAL CLAVICULECTOMY WITH ROTATOR CUFF REPAIR AND EXTENSIVE DEBRIDEMENT;  Surgeon: Lorn Junes, MD;  Location: [facility];  Service: Orthopedics;  Laterality: Left;    Social History   Socioeconomic History  . Marital status: Married    Spouse name: Not on file  . Number of children: Not on file  . Years of education: Not on file  . Highest education level: Not on file  Occupational History  . Not on file  Social Needs  . Financial resource strain: Not on file  .  Food insecurity:    Worry: Not on file    Inability: Not on file  . Transportation needs:    Medical: Not on file    Non-medical: Not on file  Tobacco Use  . Smoking status: Never Smoker  . Smokeless tobacco: Never Used  Substance and Sexual Activity  . Alcohol use: Yes    Alcohol/week: 0.0 - 1.0 standard drinks    Comment: social  . Drug use: No  . Sexual activity: Never    Partners: Male    Birth control/protection: Surgical    Comment: hysterectomy  Lifestyle  . Physical activity:    Days per week: Not on file    Minutes per session: Not on file  . Stress: Not on file  Relationships  . Social connections:    Talks on  phone: Not on file    Gets together: Not on file    Attends religious service: Not on file    Active member of club or organization: Not on file    Attends meetings of clubs or organizations: Not on file    Relationship status: Not on file  Other Topics Concern  . 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 Arthritis Mother   . Cancer Father 48       prostrate  . Hypertension Father   . Stroke Father        in setting of sepsis f/ RMSF  . Heart disease Brother 10       cardiomyopathy s/p ICD, on transplant list    Review of Systems  Constitutional: Negative for chills and fever.  Eyes: Negative for visual disturbance.  Respiratory: Negative for cough, shortness of breath and wheezing.   Cardiovascular: Negative for chest pain, palpitations and leg swelling.  Gastrointestinal: Negative for abdominal pain, blood in stool, constipation, diarrhea and nausea.       No gerd  Genitourinary: Negative for dysuria and hematuria.  Musculoskeletal: Positive for arthralgias.  Skin: Negative for color change and rash.  Neurological: Positive for headaches (rare). Negative for light-headedness and numbness.  Psychiatric/Behavioral: Positive for dysphoric mood. The patient is not nervous/anxious.        Objective:   Vitals:   03/26/18 1356  BP: 122/68  Pulse: 81  Resp: 16  Temp: 99 F (37.2 C)  SpO2: 96%   Filed Weights   03/26/18 1356  Weight: 162 lb 3.2 oz (73.6 kg)   Body mass index is 29.67 kg/m.  Wt Readings from Last 3 Encounters:  03/26/18 162 lb 3.2 oz (73.6 kg)  01/16/18 155 lb (70.3 kg)  12/19/17 153 lb (69.4 kg)     Physical Exam Constitutional: She appears well-developed and well-nourished. No distress.  HENT:  Head:  Normocephalic and atraumatic.  Right Ear: External ear normal. Normal ear canal and TM Left Ear: External ear normal.  Normal ear canal and TM Mouth/Throat: Oropharynx is clear and moist.  Eyes: Conjunctivae and EOM are normal.  Neck: Neck supple. No tracheal deviation present. No thyromegaly present.  No carotid bruit  Cardiovascular: Normal rate, regular rhythm and normal heart sounds.   No murmur heard.  No edema. Pulmonary/Chest: Effort normal and breath sounds normal.  No respiratory distress. She has no wheezes. She has no rales.  Breast: deferred to Gyn Abdominal: Soft. She exhibits no distension. There is no tenderness.  Lymphadenopathy: She has no cervical adenopathy.  Skin: Skin is warm and dry. She is not diaphoretic.  Psychiatric: She has a normal mood and affect. Her behavior is normal.        Assessment & Plan:   Physical exam: Screening blood work   ordered Immunizations  Flu vaccine done, had shingrix, others up to date  Colonoscopy    Up to date  Mammogram    Up to date  Gyn  Has appt Dexa  Done with Dr Sabra Heck   - Normal  Eye exams  Up to date  - Dr Marica Otter EKG   Done 2014 Exercise - walking with old dog   1.5 mi  daily Weight - will work on weight loss - she will likely try the fasting diet Skin   No concerns - derm today Substance abuse   none  See Problem List for Assessment and Plan of chronic medical problems.    FU in 6 months

## 2018-03-26 ENCOUNTER — Ambulatory Visit (INDEPENDENT_AMBULATORY_CARE_PROVIDER_SITE_OTHER): Payer: 59 | Admitting: Internal Medicine

## 2018-03-26 ENCOUNTER — Encounter: Payer: Self-pay | Admitting: Internal Medicine

## 2018-03-26 VITALS — BP 122/68 | HR 81 | Temp 99.0°F | Resp 16 | Ht 62.0 in | Wt 162.2 lb

## 2018-03-26 DIAGNOSIS — D2262 Melanocytic nevi of left upper limb, including shoulder: Secondary | ICD-10-CM | POA: Diagnosis not present

## 2018-03-26 DIAGNOSIS — L821 Other seborrheic keratosis: Secondary | ICD-10-CM | POA: Diagnosis not present

## 2018-03-26 DIAGNOSIS — E039 Hypothyroidism, unspecified: Secondary | ICD-10-CM

## 2018-03-26 DIAGNOSIS — F329 Major depressive disorder, single episode, unspecified: Secondary | ICD-10-CM

## 2018-03-26 DIAGNOSIS — E7849 Other hyperlipidemia: Secondary | ICD-10-CM

## 2018-03-26 DIAGNOSIS — D2261 Melanocytic nevi of right upper limb, including shoulder: Secondary | ICD-10-CM | POA: Diagnosis not present

## 2018-03-26 DIAGNOSIS — B360 Pityriasis versicolor: Secondary | ICD-10-CM | POA: Diagnosis not present

## 2018-03-26 DIAGNOSIS — C55 Malignant neoplasm of uterus, part unspecified: Secondary | ICD-10-CM | POA: Diagnosis not present

## 2018-03-26 DIAGNOSIS — Z0000 Encounter for general adult medical examination without abnormal findings: Secondary | ICD-10-CM | POA: Diagnosis not present

## 2018-03-26 DIAGNOSIS — L814 Other melanin hyperpigmentation: Secondary | ICD-10-CM | POA: Diagnosis not present

## 2018-03-26 DIAGNOSIS — D2371 Other benign neoplasm of skin of right lower limb, including hip: Secondary | ICD-10-CM | POA: Diagnosis not present

## 2018-03-26 DIAGNOSIS — F32A Depression, unspecified: Secondary | ICD-10-CM

## 2018-03-26 DIAGNOSIS — E119 Type 2 diabetes mellitus without complications: Secondary | ICD-10-CM

## 2018-03-26 DIAGNOSIS — D225 Melanocytic nevi of trunk: Secondary | ICD-10-CM | POA: Diagnosis not present

## 2018-03-26 NOTE — Assessment & Plan Note (Signed)
Controlled, stable Continue current dose of medication  

## 2018-03-26 NOTE — Assessment & Plan Note (Addendum)
No evidence of recurrence Up to date with gyn

## 2018-03-26 NOTE — Assessment & Plan Note (Signed)
Did not tolerate lipitor or crestor Not on any medication now Will check lipids, cmp Work on weight loss, diet changes

## 2018-03-26 NOTE — Assessment & Plan Note (Signed)
Currently not on medication Did not tolerate metformin, victoza/trulicity Will work on weight loss, diet changes Would like to avoid medication if possible Fu in 6 months

## 2018-03-26 NOTE — Assessment & Plan Note (Signed)
Clinically euthyroid Check tsh  Titrate med dose if needed  

## 2018-03-28 ENCOUNTER — Other Ambulatory Visit (INDEPENDENT_AMBULATORY_CARE_PROVIDER_SITE_OTHER): Payer: 59

## 2018-03-28 DIAGNOSIS — E119 Type 2 diabetes mellitus without complications: Secondary | ICD-10-CM | POA: Diagnosis not present

## 2018-03-28 DIAGNOSIS — Z0000 Encounter for general adult medical examination without abnormal findings: Secondary | ICD-10-CM

## 2018-03-28 DIAGNOSIS — E7849 Other hyperlipidemia: Secondary | ICD-10-CM | POA: Diagnosis not present

## 2018-03-28 DIAGNOSIS — E039 Hypothyroidism, unspecified: Secondary | ICD-10-CM | POA: Diagnosis not present

## 2018-03-28 LAB — COMPREHENSIVE METABOLIC PANEL
ALT: 12 U/L (ref 0–35)
AST: 13 U/L (ref 0–37)
Albumin: 4.3 g/dL (ref 3.5–5.2)
Alkaline Phosphatase: 59 U/L (ref 39–117)
BUN: 21 mg/dL (ref 6–23)
CHLORIDE: 101 meq/L (ref 96–112)
CO2: 30 meq/L (ref 19–32)
Calcium: 9.3 mg/dL (ref 8.4–10.5)
Creatinine, Ser: 0.58 mg/dL (ref 0.40–1.20)
GFR: 112.01 mL/min (ref >60.00)
Glucose, Bld: 147 mg/dL — ABNORMAL HIGH (ref 70–99)
Potassium: 3.9 mEq/L (ref 3.5–5.1)
Sodium: 138 mEq/L (ref 135–145)
Total Bilirubin: 0.6 mg/dL (ref 0.2–1.2)
Total Protein: 7.2 g/dL (ref 6.0–8.3)

## 2018-03-28 LAB — CBC WITH DIFFERENTIAL/PLATELET
BASOS ABS: 0 10*3/uL (ref 0.0–0.1)
BASOS PCT: 0.6 % (ref 0.0–3.0)
Eosinophils Absolute: 0.1 10*3/uL (ref 0.0–0.7)
Eosinophils Relative: 1.8 % (ref 0.0–5.0)
HEMATOCRIT: 37.9 % (ref 36.0–46.0)
HEMOGLOBIN: 12.7 g/dL (ref 12.0–15.0)
Lymphocytes Relative: 33.1 % (ref 12.0–46.0)
Lymphs Abs: 1.8 10*3/uL (ref 0.7–4.0)
MCHC: 33.5 g/dL (ref 30.0–36.0)
MCV: 90 fl (ref 78.0–100.0)
MONOS PCT: 10.3 % (ref 3.0–12.0)
Monocytes Absolute: 0.6 10*3/uL (ref 0.1–1.0)
NEUTROS ABS: 3 10*3/uL (ref 1.4–7.7)
Neutrophils Relative %: 54.2 % (ref 43.0–77.0)
PLATELETS: 269 10*3/uL (ref 150.0–400.0)
RBC: 4.21 Mil/uL (ref 3.87–5.11)
RDW: 14.1 % (ref 11.5–15.5)
WBC: 5.5 10*3/uL (ref 4.0–10.5)

## 2018-03-28 LAB — HEMOGLOBIN A1C: Hgb A1c MFr Bld: 7.6 % — ABNORMAL HIGH (ref 4.6–6.5)

## 2018-03-28 LAB — TSH: TSH: 3.48 u[IU]/mL (ref 0.35–4.50)

## 2018-03-28 LAB — LIPID PANEL
CHOL/HDL RATIO: 5
Cholesterol: 288 mg/dL — ABNORMAL HIGH (ref 0–200)
HDL: 62.2 mg/dL (ref >39.00)
LDL CALC: 203 mg/dL — AB (ref 0–99)
NonHDL: 225.85
TRIGLYCERIDES: 114 mg/dL (ref 0.0–149.0)
VLDL: 22.8 mg/dL (ref 0.0–40.0)

## 2018-03-30 ENCOUNTER — Encounter: Payer: Self-pay | Admitting: Internal Medicine

## 2018-03-31 ENCOUNTER — Other Ambulatory Visit: Payer: Self-pay | Admitting: Internal Medicine

## 2018-03-31 MED FILL — LEVOTHYROXINE 75 MCG TABLET: 75 | 90 days supply | Qty: 90 | Fill #0

## 2018-04-23 ENCOUNTER — Telehealth: Payer: Self-pay | Admitting: Certified Nurse Midwife

## 2018-04-23 ENCOUNTER — Ambulatory Visit: Payer: 59 | Admitting: Certified Nurse Midwife

## 2018-04-23 NOTE — Progress Notes (Deleted)
62 y.o. [id number] Married  {Race/ethnicity:17218} Fe here for annual exam.    Patient's last menstrual period was 05/22/2003.          Sexually active: {yes no:314532}  The current method of family planning is status post hysterectomy.    Exercising: {yes no:314532}  {types:19826} Smoker:  {YES NO:22349}  ROS  Health Maintenance: Pap:  03-13-17 neg (hx of endometrial cancer) History of Abnormal Pap: endometrial cancer, hysterectomy MMG:  10/18 bilateral & rt breast category b density birads 2:neg Self Breast exams: {YES NO:22349} Colonoscopy:  2018 normal f/u 78yrs BMD:   2015 TDaP:  2014 Shingles: 2018 Pneumonia: 2014 Hep C and HIV: both neg 2016 Labs: ***   reports that she has never smoked. She has never used smokeless tobacco. She reports that she drinks alcohol. She reports that she does not use drugs.  Past Medical History:  Diagnosis Date  . Anxiety   . Arthritis   . Complex endometrial hyperplasia 2005  . 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 Diet-controlled type 2 diabetes mellitus ([place])   . Endometrial cancer ([place]) 10/2011 dx   s/p vag hyst 11/2011  . History of chicken pox   . Hyperlipidemia   . Hypothyroidism   . 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 Rotator cuff injury     Past Surgical History:  Procedure Laterality Date  . ABDOMINAL HYSTERECTOMY    . CESAREAN SECTION     x's 4  . DILATION AND CURETTAGE OF UTERUS     hysteroscopy  . FRACTURE SURGERY     nose as a child  . OPEN REDUCTION INTERNAL FIXATION (ORIF) DISTAL PHALANX Left 08/05/2012   Procedure: OPEN REDUCTION INTERNAL FIXATION (ORIF) DISTAL PHALANX VERSUS EXTERNAL FIXATION LEFT RING FINGER;  Surgeon: Tennis Must, MD;  Location: [facility];  Service: Orthopedics;  Laterality: Left;  . ROBOTIC ASSISTED LAP VAGINAL HYSTERECTOMY  12/04/2011   Procedure: ROBOTIC ASSISTED LAPAROSCOPIC VAGINAL HYSTERECTOMY;  Surgeon: Imagene Gurney A. Alycia Rossetti, MD;  [facility];  Service:  Gynecology;  Laterality: N/A;  . SHOULDER ARTHROSCOPY WITH ROTATOR CUFF REPAIR AND SUBACROMIAL DECOMPRESSION Left 11/27/2013   Procedure: LEFT SHOULDER ARTHROSCOPY, SUBACROMIAL DECOMPRESSION, PARTIAL ACROMIOPLASTY WITH  CORACOACROMIAL RELEASE, DISTAL CLAVICULECTOMY WITH ROTATOR CUFF REPAIR AND EXTENSIVE DEBRIDEMENT;  Surgeon: Lorn Junes, MD;  Location: [facility];  Service: Orthopedics;  Laterality: Left;    Current Outpatient Medications  Medication Sig Dispense Refill  . glucose blood (FREESTYLE LITE) test strip Use as instructed 100 each 12  . Lancets (FREESTYLE) lancets Use as instructed 100 each 12  . levothyroxine (SYNTHROID, LEVOTHROID) 75 MCG tablet TAKE 1 TABLET BY MOUTH DAILY 90 tablet 1  . sertraline (ZOLOFT) 100 MG tablet TAKE 1 TABLET (100 MG TOTAL) BY MOUTH DAILY. 90 tablet 1   No current facility-administered medications for this visit.     Family History  Problem Relation Age of Onset  . Arthritis Mother   . Cancer Father 71       prostrate  . Hypertension Father   . Stroke Father        in setting of sepsis f/ RMSF  . Heart disease Brother 14       cardiomyopathy s/p ICD, on transplant list  . Heart failure Brother   . Heart failure Other     ROS:  Pertinent items are noted in HPI.  Otherwise, a comprehensive ROS was negative.  Exam:   LMP 05/22/2003  Ht Readings from Last 3 Encounters:  03/26/18 5\' 2"  (1.575 m)  01/16/18 5\' 2"  (1.575 m)  12/19/17 5\' 2"  (1.575 m)    General appearance: alert, cooperative and appears stated age Head: Normocephalic, without obvious abnormality, atraumatic Neck: no adenopathy, supple, symmetrical, trachea midline and thyroid {EXAM; THYROID:18604} Lungs: clear to auscultation bilaterally Breasts: {Exam; breast:13139::"normal appearance, no masses or tenderness"} Heart: regular rate and rhythm Abdomen: soft, non-tender; no masses,  no organomegaly Extremities: extremities normal, atraumatic, no cyanosis  or edema Skin: Skin color, texture, turgor normal. No rashes or lesions Lymph nodes: Cervical, supraclavicular, and axillary nodes normal. No abnormal inguinal nodes palpated Neurologic: Grossly normal   Pelvic: External genitalia:  no lesions              Urethra:  normal appearing urethra with no masses, tenderness or lesions              Bartholin's and Skene's: normal                 Vagina: normal appearing vagina with normal color and discharge, no lesions              Cervix: {exam; cervix:14595}              Pap taken: {yes no:314532} Bimanual Exam:  Uterus:  {exam; uterus:12215}              Adnexa: {exam; adnexa:12223}               Rectovaginal: Confirms               Anus:  normal sphincter tone, no lesions  Chaperone present: ***  A:  Well Woman with normal exam  P:   Reviewed health and wellness pertinent to exam  Pap smear: {YES NO:22349}  {plan; gyn:5269::"mammogram","pap smear","return annually or prn"}  An After Visit Summary was printed and given to the patient.

## 2018-04-23 NOTE — Telephone Encounter (Signed)
Patient left voicemail over lunch stating that she has come down with the stomach bug and to cancel her appointment. Patient in recall.  Routing to [employer] for [employer]

## 2018-04-24 ENCOUNTER — Ambulatory Visit: Payer: Self-pay

## 2018-04-24 NOTE — Telephone Encounter (Signed)
Call ret'd to pt.  Reported she had vomiting and diarrhea several times yesterday, that continued until about 3:00 AM.  Reported she had fever of 100.5 at 7:00 AM today.  Waited to take any tylenol until about 1:00 PM, due to the nausea/ vomiting.  Reported she ate soup about 30 min. Prior to this phone call, and vomited the soup up.  Reported she has been sipping on Gatorade and water in small amts.  Took Imodium for diarrhea. Stated she had abdominal pain, dizziness, and headache.  Reported the abdominal pain has improved, but has residual soreness of abd. From frequent vomiting.  Reported the headache and dizziness has improved.  Stated she is starting to feel better, but is asking for medication for nausea/ vomiting. Stated this illness has gone through several family members, since Thanksgiving.  Advised Dr. Quay Burow not in office this afternoon.  Offered to schedule an appt. In AM, for eval.  Stated she preferred to wait to see if she continues to improve.  Care advice given per protocol.  Verb. Understanding.  Will call in AM for appt. If sx's worsen.           Reason for Disposition . [1] MILD or MODERATE vomiting AND [2] present > 48 hours (2 days) (Exception: mild vomiting with associated diarrhea)  Answer Assessment - Initial Assessment Questions 1. VOMITING SEVERITY: "How many times have you vomited in the past 24 hours?"     - MILD:  1 - 2 times/day    - MODERATE: 3 - 5 times/day, decreased oral intake without significant weight loss or symptoms of dehydration    - SEVERE: 6 or more times/day, vomits everything or nearly everything, with significant weight loss, symptoms of dehydration      severe 2. ONSET: "When did the vomiting begin?"      Onset yesterday; last vomited about 30 min. ago, after eating soup  3. FLUIDS: "What fluids or food have you vomited up today?" "Have you been able to keep any fluids down?"     Sipping on fluids  4. ABDOMINAL PAIN: "Are your having any abdominal pain?"  If yes : "How bad is it and what does it feel like?" (e.g., crampy, dull, intermittent, constant)      Abdominal pain eased, but is sore from vomiting  5. DIARRHEA: "Is there any diarrhea?" If so, ask: "How many times today?"      Diarrhea was constant; last occurred about 3:00 AM  6. CONTACTS: "Is there anyone else in the family with the same symptoms?"      Several family members 7. CAUSE: "What do you think is causing your vomiting?"     GI virus 8. HYDRATION STATUS: "Any signs of dehydration?" (e.g., dry mouth [not only dry lips], too weak to stand) "When did you last urinate?"     Weakness is improved, is urinating in good amts., some dizziness - has improved    9. OTHER SYMPTOMS: "Do you have any other symptoms?" (e.g., fever, headache, vertigo, vomiting blood or coffee grounds, recent head injury)     Headache, fever of 100.5 at 7:00 AM  10. PREGNANCY: "Is there any chance you are pregnant?" "When was your last menstrual period?"      n/a  Protocols used: [facility] Message from Bea Graff, NT sent at 04/24/2018 4:06 PM EST   Summary: vomiting   Pt states that she has been throwing up for 2 days due to the stomach bug and she is  wanting to see if phenergan can be called in for her?

## 2018-04-28 ENCOUNTER — Other Ambulatory Visit: Payer: Self-pay | Admitting: Certified Nurse Midwife

## 2018-04-28 DIAGNOSIS — Z1231 Encounter for screening mammogram for malignant neoplasm of breast: Secondary | ICD-10-CM

## 2018-04-29 ENCOUNTER — Encounter: Payer: Self-pay | Admitting: Internal Medicine

## 2018-04-29 ENCOUNTER — Other Ambulatory Visit: Payer: Self-pay | Admitting: Internal Medicine

## 2018-04-29 MED FILL — FLUTICASONE PROP 50 MCG SPR: 50 | 90 days supply | Qty: 48 | Fill #0

## 2018-04-30 ENCOUNTER — Ambulatory Visit (INDEPENDENT_AMBULATORY_CARE_PROVIDER_SITE_OTHER): Payer: 59 | Admitting: Certified Nurse Midwife

## 2018-04-30 ENCOUNTER — Other Ambulatory Visit (HOSPITAL_COMMUNITY)
Admission: RE | Admit: 2018-04-30 | Discharge: 2018-04-30 | Disposition: A | Discharge status: Home / Self Care | Payer: 59 | Source: Ambulatory Visit | Attending: Certified Nurse Midwife | Admitting: Certified Nurse Midwife

## 2018-04-30 ENCOUNTER — Other Ambulatory Visit: Payer: Self-pay

## 2018-04-30 ENCOUNTER — Encounter: Payer: Self-pay | Admitting: Certified Nurse Midwife

## 2018-04-30 VITALS — BP 110/60 | HR 68 | Resp 16 | Ht 62.0 in | Wt 156.0 lb

## 2018-04-30 DIAGNOSIS — Z01419 Encounter for gynecological examination (general) (routine) without abnormal findings: Secondary | ICD-10-CM | POA: Diagnosis not present

## 2018-04-30 DIAGNOSIS — Z780 Asymptomatic menopausal state: Secondary | ICD-10-CM | POA: Diagnosis not present

## 2018-04-30 DIAGNOSIS — Z124 Encounter for screening for malignant neoplasm of cervix: Secondary | ICD-10-CM | POA: Insufficient documentation

## 2018-04-30 DIAGNOSIS — Z8742 Personal history of other diseases of the female genital tract: Secondary | ICD-10-CM | POA: Diagnosis not present

## 2018-04-30 NOTE — Progress Notes (Signed)
62 y.o. [id number] Married  Caucasian Fe here for annual exam. Post menopausal no HRT. Denies vaginal bleeding or vaginal dryness, that she is aware.. Sees Dr. Quay Burow for aex,labs, Hypothyroid, Zoloft management. Recent stomach virus, still having food issues, but resolving. Considering probiotic use.Will be traveling out of the country to visit with family soon. No other health issues today.   Patient's last menstrual period was 05/22/2003.          Sexually active: Yes.    The current method of family planning is status post hysterectomy.    Exercising: Yes.    walking Smoker:  no  Review of Systems  Constitutional: Negative.   HENT: Negative.   Eyes: Negative.   Respiratory: Negative.   Cardiovascular: Negative.   Gastrointestinal: Negative.   Genitourinary: Negative.   Musculoskeletal: Negative.   Skin: Negative.   Neurological: Negative.   Endo/Heme/Allergies: Negative.   Psychiatric/Behavioral: Negative.     Health Maintenance: Pap:  03-08-16 neg, 03-13-17 neg History of Abnormal Pap: endometrial cancer, hysterectomy MMG:  10/18 bilateral & rt breast u/s birads 2:neg, scheduled for 1/20 Self Breast exams: yes Colonoscopy:  2018 normal 48yrs BMD:   2015 TDaP:  2014 Shingles: 2018 Pneumonia: 2014 Hep C and HIV: both neg 2016 Labs: no   reports that she has never smoked. She has never used smokeless tobacco. She reports that she drinks alcohol. She reports that she does not use drugs.  Past Medical History:  Diagnosis Date  . Anxiety   . Arthritis   . Complex endometrial hyperplasia 2005  . 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 Diet-controlled type 2 diabetes mellitus ([place])   . Endometrial cancer ([place]) 10/2011 dx   s/p vag hyst 11/2011  . History of chicken pox   . Hyperlipidemia   . Hypothyroidism   . 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 Rotator cuff injury     Past Surgical History:  Procedure Laterality Date  . ABDOMINAL HYSTERECTOMY    . CESAREAN  SECTION     x's 4  . DILATION AND CURETTAGE OF UTERUS     hysteroscopy  . FRACTURE SURGERY     nose as a child  . OPEN REDUCTION INTERNAL FIXATION (ORIF) DISTAL PHALANX Left 08/05/2012   Procedure: OPEN REDUCTION INTERNAL FIXATION (ORIF) DISTAL PHALANX VERSUS EXTERNAL FIXATION LEFT RING FINGER;  Surgeon: Tennis Must, MD;  Location: [facility];  Service: Orthopedics;  Laterality: Left;  . ROBOTIC ASSISTED LAP VAGINAL HYSTERECTOMY  12/04/2011   Procedure: ROBOTIC ASSISTED LAPAROSCOPIC VAGINAL HYSTERECTOMY;  Surgeon: Imagene Gurney A. Alycia Rossetti, MD;  [facility];  Service: Gynecology;  Laterality: N/A;  . SHOULDER ARTHROSCOPY WITH ROTATOR CUFF REPAIR AND SUBACROMIAL DECOMPRESSION Left 11/27/2013   Procedure: LEFT SHOULDER ARTHROSCOPY, SUBACROMIAL DECOMPRESSION, PARTIAL ACROMIOPLASTY WITH  CORACOACROMIAL RELEASE, DISTAL CLAVICULECTOMY WITH ROTATOR CUFF REPAIR AND EXTENSIVE DEBRIDEMENT;  Surgeon: Lorn Junes, MD;  Location: [facility];  Service: Orthopedics;  Laterality: Left;    Current Outpatient Medications  Medication Sig Dispense Refill  . fluticasone (FLONASE) 50 MCG/ACT nasal spray PLACE 2 SPRAYS INTO BOTH NOSTRILS DAILY. 48 g 2  . glucose blood (FREESTYLE LITE) test strip Use as instructed 100 each 12  . Lancets (FREESTYLE) lancets Use as instructed 100 each 12  . levothyroxine (SYNTHROID, LEVOTHROID) 75 MCG tablet TAKE 1 TABLET BY MOUTH DAILY 90 tablet 1  . sertraline (ZOLOFT) 100 MG tablet TAKE 1 TABLET (100 MG TOTAL) BY MOUTH DAILY.  90 tablet 1   No current facility-administered medications for this visit.     Family History  Problem Relation Age of Onset  . Arthritis Mother   . Cancer Father 67       prostrate  . Hypertension Father   . Stroke Father        in setting of sepsis f/ RMSF  . Heart disease Brother 39       cardiomyopathy s/p ICD, on transplant list  . Heart failure Brother   . Heart failure Other     ROS:  Pertinent items are noted  in HPI.  Otherwise, a comprehensive ROS was negative.  Exam:   LMP 05/22/2003    Ht Readings from Last 3 Encounters:  03/26/18 5\' 2"  (1.575 m)  01/16/18 5\' 2"  (1.575 m)  12/19/17 5\' 2"  (1.575 m)    General appearance: alert, cooperative and appears stated age Head: Normocephalic, without obvious abnormality, atraumatic Neck: no adenopathy, supple, symmetrical, trachea midline and thyroid normal to inspection and palpation Lungs: clear to auscultation bilaterally Breasts: normal appearance, no masses or tenderness, No nipple retraction or dimpling, No nipple discharge or bleeding, No axillary or supraclavicular adenopathy Heart: regular rate and rhythm Abdomen: soft, non-tender; no masses,  no organomegaly Extremities: extremities normal, atraumatic, no cyanosis or edema Skin: Skin color, texture, turgor normal. No rashes or lesions Lymph nodes: Cervical, supraclavicular, and axillary nodes normal. No abnormal inguinal nodes palpated Neurologic: Grossly normal   Pelvic: External genitalia:  no lesions              Urethra:  normal appearing urethra with no masses, tenderness or lesions              Bartholin's and Skene's: normal                 Vagina: normal appearing vagina with normal color and discharge, no lesions              Cervix: absent              Pap taken: Yes.   Bimanual Exam:  Uterus:  uterus absent              Adnexa: no mass, fullness, tenderness, normal               Rectovaginal: Confirms               Anus:  normal sphincter tone, no lesions  Chaperone present: yes  A:  Well Woman with normal exam  Post menopausal no HRT  Atrophic vaginitis  History of Endometrial cancer with TAH with BSO  Hypothyroid, Anxiety with PCP management  Recent stomach virus no issues today  P:   Reviewed health and wellness pertinent to exam  Discussed vaginal finding and need for vaginal moisture. Options of OTC, Olive oil or coconut oil discussed 2-3 times  weekly.  Continue follow up with PCP as indicated.  Discussed starting on refrigerated probiotic to help restore normal flora in GI again, also yogurt addition to diet.  Pap smear: yes   counseled on breast self exam, mammography screening, feminine hygiene, adequate intake of calcium and vitamin D, diet and exercise  return annually or prn  An After Visit Summary was printed and given to the patient.

## 2018-04-30 NOTE — Patient Instructions (Signed)

## 2018-05-09 LAB — CYTOLOGY - PAP
Diagnosis: NEGATIVE
HPV (WINDOPATH): NOT DETECTED

## 2018-06-03 ENCOUNTER — Telehealth: Payer: Self-pay | Admitting: Internal Medicine

## 2018-06-03 NOTE — Telephone Encounter (Signed)
Copied from [place] [id number]. Topic: Quick Communication - See Telephone Encounter >> Jun 03, 2018  1:41 PM Rayann Heman wrote: CRM for notification. See Telephone encounter for: 06/03/18. Pt called and stated that she would like dr Quay Burow to give her a call back. She would like a referral for an echocardiogram. Pt states that she needs this done as soon as possible. Please advise

## 2018-06-03 NOTE — Telephone Encounter (Signed)
Pt states that she had everything taken care of and did not need a referral from Dr. Quay Burow. Will call back if anything changes.

## 2018-06-04 ENCOUNTER — Ambulatory Visit
Admission: RE | Admit: 2018-06-04 | Discharge: 2018-06-04 | Disposition: A | Discharge status: Home / Self Care | Payer: 59 | Source: Ambulatory Visit | Attending: Certified Nurse Midwife | Admitting: Certified Nurse Midwife

## 2018-06-04 ENCOUNTER — Ambulatory Visit (HOSPITAL_COMMUNITY)
Admission: RE | Admit: 2018-06-04 | Discharge: 2018-06-04 | Disposition: A | Discharge status: Home / Self Care | Payer: 59 | Source: Ambulatory Visit | Attending: Internal Medicine | Admitting: Internal Medicine

## 2018-06-04 VITALS — BP 118/76 | HR 71 | Wt 157.8 lb

## 2018-06-04 DIAGNOSIS — Z8542 Personal history of malignant neoplasm of other parts of uterus: Secondary | ICD-10-CM | POA: Insufficient documentation

## 2018-06-04 DIAGNOSIS — Z79899 Other long term (current) drug therapy: Secondary | ICD-10-CM | POA: Insufficient documentation

## 2018-06-04 DIAGNOSIS — E663 Overweight: Secondary | ICD-10-CM | POA: Diagnosis not present

## 2018-06-04 DIAGNOSIS — Z79890 Hormone replacement therapy (postmenopausal): Secondary | ICD-10-CM | POA: Insufficient documentation

## 2018-06-04 DIAGNOSIS — E119 Type 2 diabetes mellitus without complications: Secondary | ICD-10-CM | POA: Insufficient documentation

## 2018-06-04 DIAGNOSIS — Z136 Encounter for screening for cardiovascular disorders: Secondary | ICD-10-CM | POA: Diagnosis not present

## 2018-06-04 DIAGNOSIS — Z1231 Encounter for screening mammogram for malignant neoplasm of breast: Secondary | ICD-10-CM

## 2018-06-04 DIAGNOSIS — Z888 Allergy status to other drugs, medicaments and biological substances status: Secondary | ICD-10-CM | POA: Insufficient documentation

## 2018-06-04 DIAGNOSIS — Z885 Allergy status to narcotic agent status: Secondary | ICD-10-CM | POA: Diagnosis not present

## 2018-06-04 DIAGNOSIS — E039 Hypothyroidism, unspecified: Secondary | ICD-10-CM | POA: Insufficient documentation

## 2018-06-04 DIAGNOSIS — H25813 Combined forms of age-related cataract, bilateral: Secondary | ICD-10-CM | POA: Diagnosis not present

## 2018-06-04 DIAGNOSIS — H52223 Regular astigmatism, bilateral: Secondary | ICD-10-CM | POA: Diagnosis not present

## 2018-06-04 DIAGNOSIS — H5213 Myopia, bilateral: Secondary | ICD-10-CM | POA: Diagnosis not present

## 2018-06-04 DIAGNOSIS — Z882 Allergy status to sulfonamides status: Secondary | ICD-10-CM | POA: Insufficient documentation

## 2018-06-04 DIAGNOSIS — Z8249 Family history of ischemic heart disease and other diseases of the circulatory system: Secondary | ICD-10-CM | POA: Diagnosis not present

## 2018-06-04 DIAGNOSIS — H524 Presbyopia: Secondary | ICD-10-CM | POA: Diagnosis not present

## 2018-06-04 LAB — HM DIABETES EYE EXAM

## 2018-06-04 NOTE — Consult Note (Signed)
ADVANCED HF CLINIC CONSULT NOTE  Referring Physician: Dr. Lattie Chan Ph.D.    HPI:  Cheryl Chan is a 63 y/o woman (wife of Cheryl Chan in [place]) who is referred by Dr. Broadus Chan for screening for possible familial NICM.   She denies any h/o known cardiac disease. About 10-12 years ago she saw Dr. Doreatha Chan for cardiac screening and had a normal ECG and stress test.   In 2006 her brother was diagnosed with systolic HF due to severe NICM with an EF 8%. He ahs been followed by Dr. Fletcher Chan and his EF has fortunately improved to 40% with aggressive medical therapy. In 8/19, her nephew (her brother's son) developed persistent upper respiratory symptoms. Eventually found to have severe systolic HF and cardiogenic shock at [facility]. Was transported to [facility] and placed on ECMO but unfortunately they were unable to save him.   They have subsequently been seen by Dr. Broadus Chan for genetic screening (results not back yet) and have been advised to have first-degree relatives screened for NICM with an emphasis on ARVC.   She is active but does not exercise regularly. Walks frequently without difficulty. No CP or undue SOB. No palpitations or syncope.     Review of Systems: [y] = yes, [ ]  = no   General: Weight gain [ ] ; Weight loss [ ] ; Anorexia [ ] ; Fatigue [ ] ; Fever [ ] ; Chills [ ] ; Weakness [ ]   Cardiac: Chest pain/pressure [ ] ; Resting SOB [ ] ; Exertional SOB [ ] ; Orthopnea [ ] ; Pedal Edema [ ] ; Palpitations [ ] ; Syncope [ ] ; Presyncope [ ] ; Paroxysmal nocturnal dyspnea[ ]   Pulmonary: Cough [ ] ; Wheezing[ ] ; Hemoptysis[ ] ; Sputum [ ] ; Snoring [ ]   GI: Vomiting[ ] ; Dysphagia[ ] ; Melena[ ] ; Hematochezia [ ] ; Heartburn[ ] ; Abdominal pain [ ] ; Constipation [ ] ; Diarrhea [ ] ; BRBPR [ ]   GU: Hematuria[ ] ; Dysuria [ ] ; Nocturia[ ]   Vascular: Pain in legs with walking [ ] ; Pain in feet with lying flat [ ] ; Non-healing sores [ ] ; Stroke [ ] ; TIA [ ] ; Slurred speech [ ] ;  Neuro: Headaches[ ] ;  Vertigo[ ] ; Seizures[ ] ; Paresthesias[ ] ;Blurred vision [ ] ; Diplopia [ ] ; Vision changes [ ]   Ortho/Skin: Arthritis Blue.Reese ]; Joint pain Blue.Reese ]; Muscle pain [ ] ; Joint swelling [ ] ; Back Pain [ ] ; Rash [ ]   Psych: Depression[y ]; Anxiety[y ]  Heme: Bleeding problems [ ] ; Clotting disorders [ ] ; Anemia [ ]   Endocrine: Diabetes [ ] ; Thyroid dysfunction[ ]    Past Medical History:  Diagnosis Date  . Anxiety   . Arthritis   . Complex endometrial hyperplasia 2005  . 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 Diet-controlled type 2 diabetes mellitus ([place])   . Endometrial cancer ([place]) 10/2011 dx   s/p vag hyst 11/2011  . History of chicken pox   . Hyperlipidemia   . Hypothyroidism   . 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 Rotator cuff injury     Current Outpatient Medications  Medication Sig Dispense Refill  . fluticasone (FLONASE) 50 MCG/ACT nasal spray PLACE 2 SPRAYS INTO BOTH NOSTRILS DAILY. 48 g 2  . levothyroxine (SYNTHROID, LEVOTHROID) 75 MCG tablet TAKE 1 TABLET BY MOUTH DAILY 90 tablet 1  . sertraline (ZOLOFT) 100 MG tablet TAKE 1 TABLET (100 MG TOTAL) BY MOUTH DAILY. 90 tablet 1  . Lancets (FREESTYLE) lancets Use as instructed (Patient not taking: Reported on 06/04/2018) 100  each 12   No current facility-administered medications for this encounter.     Allergies  Allergen Reactions  . Crestor [Rosuvastatin Calcium]     Muscle ache  . Demerol [Meperidine]   . Statins   . Trulicity [Dulaglutide]     Nausea, diarrhea  . Victoza [Liraglutide]     Nausea, diarrhea  . Metformin And Related     diarrhea      Social History   Socioeconomic History  . Marital status: Married    Spouse name: Not on file  . Number of children: Not on file  . Years of education: Not on file  . Highest education level: Not on file  Occupational History  . Not on file  Social Needs  . Financial resource strain: Not on file  . Food insecurity:    Worry: Not on file    Inability: Not on  file  . Transportation needs:    Medical: Not on file    Non-medical: Not on file  Tobacco Use  . Smoking status: Never Smoker  . Smokeless tobacco: Never Used  Substance and Sexual Activity  . Alcohol use: Not Currently  . Drug use: No  . Sexual activity: Yes    Partners: Male    Birth control/protection: Surgical    Comment: hysterectomy  Lifestyle  . Physical activity:    Days per week: Not on file    Minutes per session: Not on file  . Stress: Not on file  Relationships  . Social connections:    Talks on phone: Not on file    Gets together: Not on file    Attends religious service: Not on file    Active member of club or organization: Not on file    Attends meetings of clubs or organizations: Not on file    Relationship status: Not on file  . Intimate partner violence:    Fear of current or ex partner: Not on file    Emotionally abused: Not on file    Physically abused: Not on file    Forced sexual activity: Not on file  Other Topics Concern  . 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 Arthritis Mother   . Cancer Father 70       prostrate  . Hypertension Father   . Stroke Father        in setting of sepsis f/ RMSF  . Heart disease Brother 24       cardiomyopathy s/p ICD, on transplant list  . Heart failure Brother   . Heart failure Other     Vitals:   06/04/18 0940  BP: 118/76  Pulse: 71  SpO2: 96%  Weight: 71.6 kg (157 lb 12.8 oz)    PHYSICAL EXAM: General:  Well appearing. No respiratory difficulty HEENT: normal Neck: supple. no JVD. Carotids 2+ bilat; no bruits. No lymphadenopathy or thryomegaly appreciated. Cor: PMI nondisplaced. Regular rate & rhythm. No rubs, gallops or murmurs. Lungs: clear Abdomen: soft,  nontender, nondistended. No hepatosplenomegaly. No bruits or masses. Good bowel sounds. Extremities: no cyanosis, clubbing, rash, edema Neuro: alert & oriented x 3, cranial nerves grossly intact. moves all 4 extremities w/o difficulty.  Affect pleasant.  ECG: NSR 61 Normal intervals and axis, no ST-T wave abnormalities.   ASSESSMENT & PLAN: 1. CV risk screening in setting of strong family h/o NICM - Her ECG is normal and she is asymptomatic - Based on concern for possible ARVC wil plan cMRI to further evaluate - Await results of proband genetic screening from Dr. Broadus Chan.  Glori Bickers, MD  3:55 PM

## 2018-06-04 NOTE — Patient Instructions (Signed)
EKG today  Your provider has ordered you to have a cardiac MRI. Radiology will call you to schedule it.

## 2018-06-04 NOTE — Addendum Note (Signed)
Encounter addended by: Jolaine Artist, MD on: 06/04/2018 3:56 PM  Actions taken: Clinical Note Signed, Visit diagnoses modified, LOS modified

## 2018-06-23 ENCOUNTER — Other Ambulatory Visit (HOSPITAL_COMMUNITY): Payer: 59

## 2018-06-27 ENCOUNTER — Telehealth (HOSPITAL_COMMUNITY): Payer: Self-pay | Admitting: Emergency Medicine

## 2018-06-27 ENCOUNTER — Encounter: Payer: Self-pay | Admitting: Internal Medicine

## 2018-06-27 ENCOUNTER — Other Ambulatory Visit: Payer: Self-pay | Admitting: Internal Medicine

## 2018-06-27 NOTE — Telephone Encounter (Signed)
Reaching out to patient to offer assistance regarding upcoming cardiac imaging study; pt verbalizes understanding of appt date/time, parking situation and where to check in, and verified current allergies; name and call back number provided for further questions should they arise Maybree Riling RN Navigator Cardiac Imaging [facility] [phone] office [phone] cell 

## 2018-06-30 MED FILL — SERTRALINE HCL 100 MG TAB: 100 | 90 days supply | Qty: 90 | Fill #0

## 2018-07-01 ENCOUNTER — Ambulatory Visit (HOSPITAL_COMMUNITY)
Admission: RE | Admit: 2018-07-01 | Discharge: 2018-07-01 | Disposition: A | Discharge status: Home / Self Care | Payer: 59 | Source: Ambulatory Visit | Attending: Internal Medicine | Admitting: Internal Medicine

## 2018-07-01 DIAGNOSIS — E663 Overweight: Secondary | ICD-10-CM | POA: Diagnosis not present

## 2018-07-01 DIAGNOSIS — Z8249 Family history of ischemic heart disease and other diseases of the circulatory system: Secondary | ICD-10-CM | POA: Diagnosis not present

## 2018-07-01 LAB — CREATININE, SERUM
Creatinine, Ser: 0.67 mg/dL (ref 0.44–1.00)
GFR calc Af Amer: >60 mL/min (ref >60)
GFR calc non Af Amer: >60 mL/min (ref >60)

## 2018-07-01 MED ORDER — GADOBUTROL 1 MMOL/ML IV SOLN
7.5000 mL | Freq: Once | INTRAVENOUS | Status: AC | PRN
  Administered 2018-07-01: 7.5 mL via INTRAVENOUS

## 2018-07-18 MED FILL — LEVOTHYROXINE 75 MCG TABLET: 75 | 90 days supply | Qty: 90 | Fill #1

## 2018-08-05 ENCOUNTER — Encounter: Payer: Self-pay | Admitting: Internal Medicine

## 2018-08-22 MED FILL — ACCU-CHEK GUIDE STRP: 30 days supply | Qty: 100 | Fill #0

## 2018-08-23 ENCOUNTER — Other Ambulatory Visit: Payer: Self-pay | Admitting: Internal Medicine

## 2018-08-23 MED ORDER — GLUCOSE BLOOD VI STRP: ORAL_STRIP | 12 refills | Status: DC

## 2018-09-17 MED FILL — ACCU-CHEK GUIDE TEST STRIP: 33 days supply | Qty: 100 | Fill #0 | Status: TO

## 2018-09-24 ENCOUNTER — Ambulatory Visit: Payer: 59 | Admitting: Internal Medicine

## 2018-10-09 MED FILL — SERTRALINE HCL 100 MG TAB: 100 | 90 days supply | Qty: 90 | Fill #1

## 2018-10-21 ENCOUNTER — Other Ambulatory Visit: Payer: Self-pay | Admitting: Internal Medicine

## 2018-10-21 MED FILL — LEVOTHYROXINE 75 MCG TABLET: 75 | 30 days supply | Qty: 30 | Fill #0

## 2018-10-21 MED FILL — ACCU-CHEK GUIDE TEST STRIP: 33 days supply | Qty: 100 | Fill #0

## 2018-11-26 ENCOUNTER — Telehealth (HOSPITAL_COMMUNITY): Payer: Self-pay

## 2018-11-26 DIAGNOSIS — Z8249 Family history of ischemic heart disease and other diseases of the circulatory system: Secondary | ICD-10-CM

## 2018-11-26 NOTE — Telephone Encounter (Signed)
Pt called in stating she has a virtual appt with Dr Broadus John as family is being genetically worked up for heart conditions. Pt states request for specific genetic panel orders were sent via email by Dr Broadus John to Dr Haroldine Laws. As we are not sure what specific orders have been sent, advised to have her visit this afternoon and update office tomorrow with what is being requested. She will fax information tomorrow.

## 2018-11-27 ENCOUNTER — Other Ambulatory Visit (HOSPITAL_COMMUNITY): Payer: Self-pay

## 2018-11-27 DIAGNOSIS — Z8249 Family history of ischemic heart disease and other diseases of the circulatory system: Secondary | ICD-10-CM

## 2018-11-27 NOTE — Progress Notes (Signed)
Order placed for genetic testing per Dr Haroldine Laws.

## 2018-12-03 ENCOUNTER — Ambulatory Visit (INDEPENDENT_AMBULATORY_CARE_PROVIDER_SITE_OTHER): Payer: 59 | Admitting: Family Medicine

## 2018-12-03 ENCOUNTER — Encounter: Payer: Self-pay | Admitting: Family Medicine

## 2018-12-03 ENCOUNTER — Other Ambulatory Visit: Payer: Self-pay

## 2018-12-03 ENCOUNTER — Ambulatory Visit: Payer: Self-pay

## 2018-12-03 VITALS — BP 118/64 | Ht 62.0 in | Wt 152.0 lb

## 2018-12-03 DIAGNOSIS — M25511 Pain in right shoulder: Secondary | ICD-10-CM

## 2018-12-03 MED ORDER — METHYLPREDNISOLONE ACETATE 40 MG/ML IJ SUSP
40.0000 mg | Freq: Once | INTRAMUSCULAR | Status: AC
  Administered 2018-12-03: 40 mg via INTRA_ARTICULAR

## 2018-12-03 NOTE — Patient Instructions (Signed)
You have rotator cuff impingement Try to avoid painful activities (overhead activities, lifting with extended arm) as much as possible. Aleve 2 tabs twice a day with food OR ibuprofen 3 tabs three times a day with food for pain and inflammation if needed. Can take tylenol in addition to this. Subacromial injection may be beneficial to help with pain and to decrease inflammation - you were given this today. Consider physical therapy with transition to home exercise program - talk to your son about advancing through a protocol. Do home exercise program with theraband and scapular stabilization exercises daily 3 sets of 10 once a day. If not improving at follow-up we will consider further imaging, physical therapy, and/or nitro patches. Follow up with me in 6 weeks or as needed if you're doing well.  Your finger pain is due to arthritis. These are the different medications you can take for this: Tylenol 500mg  1-2 tabs three times a day for pain. Capsaicin, aspercreme, or biofreeze topically up to four times a day may also help with pain. Some supplements that may help for arthritis: Boswellia extract, curcumin, pycnogenol Cortisone injections are an option if a specific joint is bothersome. It's important that you continue to stay active. Consider physical therapy to strengthen muscles around the joint that hurts to take pressure off of the joint itself. Heat or ice 15 minutes at a time 3-4 times a day as needed to help with pain.

## 2018-12-03 NOTE — Progress Notes (Signed)
HPI: Pt is here c/o chronic R shoulder pain. Pain started 1 month ago, seeking care today because it was starting to wake her up from sleep. No MOI or new trauma, seen almost 1 year ago for similar symptoms and received subacromial CSI at that time that offered relief for 6 months in her shoulder and in her hand. Aggravating factors include reaching arm overhead. Alleviating factors include rest. No radiating symptoms. Describes the pain as dull and rates pain 7/10.  Pt is here c/o bilateral hand pain in her finger joints. Pain started 6 months ago. No MOI or inciting event, been told she had OA in her hands before. Aggravating factors include excessive gripping. Alleviating factors include rest. No radiating symptoms. Her L index PIP sometimes 'will get stuck' requiring her to straighten it out. No hx of trauma or injury to area in the past. No skin changes, numbness.   Past Medical History:  Diagnosis Date  . Anxiety   . Arthritis   . Complex endometrial hyperplasia 2005  . 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 Diet-controlled type 2 diabetes mellitus ([place])   . Endometrial cancer ([place]) 10/2011 dx   s/p vag hyst 11/2011  . History of chicken pox   . Hyperlipidemia   . Hypothyroidism   . 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 Rotator cuff injury     Current Outpatient Medications on File Prior to Visit  Medication Sig Dispense Refill  . fluticasone (FLONASE) 50 MCG/ACT nasal spray PLACE 2 SPRAYS INTO BOTH NOSTRILS DAILY. 48 g 2  . glucose blood (ACCU-CHEK GUIDE) test strip Test three times a day as needed 100 each 12  . Lancets (FREESTYLE) lancets Use as instructed (Patient not taking: Reported on 06/04/2018) 100 each 12  . levothyroxine (SYNTHROID) 75 MCG tablet TAKE 1 TABLET BY MOUTH DAILY 30 tablet 0  . sertraline (ZOLOFT) 100 MG tablet TAKE 1 TABLET (100 MG TOTAL) BY MOUTH DAILY. 90 tablet 1   No current facility-administered medications on file prior to visit.     Past  Surgical History:  Procedure Laterality Date  . ABDOMINAL HYSTERECTOMY    . CESAREAN SECTION     x's 4  . DILATION AND CURETTAGE OF UTERUS     hysteroscopy  . FRACTURE SURGERY     nose as a child  . OPEN REDUCTION INTERNAL FIXATION (ORIF) DISTAL PHALANX Left 08/05/2012   Procedure: OPEN REDUCTION INTERNAL FIXATION (ORIF) DISTAL PHALANX VERSUS EXTERNAL FIXATION LEFT RING FINGER;  Surgeon: Tennis Must, MD;  Location: [facility];  Service: Orthopedics;  Laterality: Left;  . ROBOTIC ASSISTED LAP VAGINAL HYSTERECTOMY  12/04/2011   Procedure: ROBOTIC ASSISTED LAPAROSCOPIC VAGINAL HYSTERECTOMY;  Surgeon: Imagene Gurney A. Alycia Rossetti, MD;  Location: [facility];  Service: Gynecology;  Laterality: N/A;  . SHOULDER ARTHROSCOPY WITH ROTATOR CUFF REPAIR AND SUBACROMIAL DECOMPRESSION Left 11/27/2013   Procedure: LEFT SHOULDER ARTHROSCOPY, SUBACROMIAL DECOMPRESSION, PARTIAL ACROMIOPLASTY WITH  CORACOACROMIAL RELEASE, DISTAL CLAVICULECTOMY WITH ROTATOR CUFF REPAIR AND EXTENSIVE DEBRIDEMENT;  Surgeon: Lorn Junes, MD;  Location: [facility];  Service: Orthopedics;  Laterality: Left;    Allergies  Allergen Reactions  . Crestor [Rosuvastatin Calcium]     Muscle ache  . Demerol [Meperidine]   . Statins   . Trulicity [Dulaglutide]     Nausea, diarrhea  . Victoza [Liraglutide]     Nausea, diarrhea  . Metformin And Related     diarrhea    Social History  Socioeconomic History  . Marital status: Married    Spouse name: Not on file  . Number of children: Not on file  . Years of education: Not on file  . Highest education level: Not on file  Occupational History  . Not on file  Social Needs  . Financial resource strain: Not on file  . Food insecurity    Worry: Not on file    Inability: Not on file  . Transportation needs    Medical: Not on file    Non-medical: Not on file  Tobacco Use  . Smoking status: Never Smoker  . Smokeless tobacco: Never Used  Substance and Sexual  Activity  . Alcohol use: Not Currently  . Drug use: No  . Sexual activity: Yes    Partners: Male    Birth control/protection: Surgical    Comment: hysterectomy  Lifestyle  . Physical activity    Days per week: Not on file    Minutes per session: Not on file  . Stress: Not on file  Relationships  . Social Herbalist on phone: Not on file    Gets together: Not on file    Attends religious service: Not on file    Active member of club or organization: Not on file    Attends meetings of clubs or organizations: Not on file    Relationship status: Not on file  . Intimate partner violence    Fear of current or ex partner: Not on file    Emotionally abused: Not on file    Physically abused: Not on file    Forced sexual activity: Not on file  Other Topics Concern  . 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 Arthritis Mother   . Cancer Father 5       prostrate  . Hypertension Father   . Stroke Father        in setting of sepsis f/ RMSF  . Heart disease Brother 82       cardiomyopathy s/p ICD, on transplant list  . Heart failure Brother   . Heart failure Other     BP 118/64   Ht 5\' 2"  (1.575 m)   Wt 152 lb (68.9 kg)   LMP 05/22/2003   BMI 27.80 kg/m   ROS:  All others negative or as described in the HPI CONST: no F/C, no malaise, no fatigue MSK: See above NEURO: no numbness/tingling, + weakness SKIN: no rash, no lesions HEME: no bleeding, no bruising, no erythema  Objective: General: well developed, well nourished, in no acute distress. Pulses: 2+ pulses radial Skin: no rashes, skin intact, (-) surgical scars, (-) ecchymosis Nerve: gross sensation to bilateral UE nerve roots intact B Fingers: Bouchard's and Heberden's  nodes R worse than L with slight radial deviation of R MCP joints, full strength and ROM, no TTP R Shoulder Exam Inspection:  (-) atrophy (-) prominent AC joint (-) prominent [place] joint (-) trapezius spasm  (-) scapular winging (-) forward posturing (-) shoulder drop (-) clavicle deformity   Tenderness:  (-) [place] joint (-) Clavicle (-) AC joint (_) Coracoid Process  (-) Anterior GH joint (-) Lateral Deltoid (-) Posterior  [place] joint (_) Trapezius  (-) Cervical paraspinal muscle (-) Pectoralis (-) Scalenes (_) Latissimus Dorsi   Range of Motion: Forward Flexion: 160 degrees (0-170) Abduction (True): 80 degrees (0-90) External Rotation: 90 degrees (0-90) Internal Rotation: 90 degrees (0-90) Neck ROM (X) Full (_) Limited  Strength: Supraspinatus: 4/5 Infraspinatus/Teres: 5/5 Subscapularis: 5/5 Biceps: 5/5 Triceps: 5/5 Deltoid: 5/5  Special Tests:  (+) Neer's sign (+) Hawkin's sign (+) Drop Arm Sign (+) Jobe Empty Can  (-) Speed's test (-) Belly Lift-Off  (-) Apprehension  (-) Relocation (-) Sulcus Sign    (-) O'Brien's      US Guided Procedure: After informed written consent timeout was performed, patient was seated in chair. Right shoulder was prepped with alcohol swab and utilizing lateral approach under US guidance, patient's right subacromial space was injected with 3:1 bupivicaine: depomedrol. Patient tolerated the procedure well without immediate complications.  Assessment and Plan:  1. Acute on chronic R shoulder pain likely 2/2 impingement - Successful injection as above - Continue home rehab exercises - f/u prn  2. Bilateral primary OA of hands - Recommended purchasing hand grip exercise tool to utilize at home - Tumeric and supplements advised for possible relief - F/u prn   Lanier Clam, DO, ATC Sports Medicine Fellow

## 2018-12-08 ENCOUNTER — Encounter: Payer: Self-pay | Admitting: Family Medicine

## 2018-12-16 ENCOUNTER — Telehealth: Payer: Self-pay | Admitting: Emergency Medicine

## 2018-12-16 DIAGNOSIS — E7849 Other hyperlipidemia: Secondary | ICD-10-CM

## 2018-12-16 DIAGNOSIS — E119 Type 2 diabetes mellitus without complications: Secondary | ICD-10-CM

## 2018-12-16 DIAGNOSIS — E039 Hypothyroidism, unspecified: Secondary | ICD-10-CM

## 2018-12-16 NOTE — Telephone Encounter (Signed)
Pt wants labs before appt.

## 2018-12-16 NOTE — Addendum Note (Signed)
Addended by: Binnie Rail on: 12/16/2018 01:58 PM   Modules accepted: Orders

## 2018-12-17 ENCOUNTER — Ambulatory Visit: Payer: 59 | Admitting: Internal Medicine

## 2018-12-17 ENCOUNTER — Other Ambulatory Visit (INDEPENDENT_AMBULATORY_CARE_PROVIDER_SITE_OTHER): Payer: 59

## 2018-12-17 DIAGNOSIS — E119 Type 2 diabetes mellitus without complications: Secondary | ICD-10-CM

## 2018-12-17 DIAGNOSIS — E7849 Other hyperlipidemia: Secondary | ICD-10-CM

## 2018-12-17 DIAGNOSIS — E039 Hypothyroidism, unspecified: Secondary | ICD-10-CM | POA: Diagnosis not present

## 2018-12-17 LAB — CBC WITH DIFFERENTIAL/PLATELET
Basophils Absolute: 0 10*3/uL (ref 0.0–0.1)
Basophils Relative: 0.5 % (ref 0.0–3.0)
Eosinophils Absolute: 0.1 10*3/uL (ref 0.0–0.7)
Eosinophils Relative: 1.6 % (ref 0.0–5.0)
HCT: 39.4 % (ref 36.0–46.0)
Hemoglobin: 13 g/dL (ref 12.0–15.0)
Lymphocytes Relative: 32.5 % (ref 12.0–46.0)
Lymphs Abs: 2 10*3/uL (ref 0.7–4.0)
MCHC: 33 g/dL (ref 30.0–36.0)
MCV: 89.8 fl (ref 78.0–100.0)
Monocytes Absolute: 0.6 10*3/uL (ref 0.1–1.0)
Monocytes Relative: 9.4 % (ref 3.0–12.0)
Neutro Abs: 3.4 10*3/uL (ref 1.4–7.7)
Neutrophils Relative %: 56 % (ref 43.0–77.0)
Platelets: 262 10*3/uL (ref 150.0–400.0)
RBC: 4.39 Mil/uL (ref 3.87–5.11)
RDW: 14.1 % (ref 11.5–15.5)
WBC: 6 10*3/uL (ref 4.0–10.5)

## 2018-12-17 LAB — COMPREHENSIVE METABOLIC PANEL
ALT: 10 U/L (ref 0–35)
AST: 13 U/L (ref 0–37)
Albumin: 4.3 g/dL (ref 3.5–5.2)
Alkaline Phosphatase: 62 U/L (ref 39–117)
BUN: 15 mg/dL (ref 6–23)
CO2: 29 mEq/L (ref 19–32)
Calcium: 9.2 mg/dL (ref 8.4–10.5)
Chloride: 104 mEq/L (ref 96–112)
Creatinine, Ser: 0.88 mg/dL (ref 0.40–1.20)
GFR: 64.99 mL/min (ref >60.00)
Glucose, Bld: 130 mg/dL — ABNORMAL HIGH (ref 70–99)
Potassium: 3.8 mEq/L (ref 3.5–5.1)
Sodium: 139 mEq/L (ref 135–145)
Total Bilirubin: 0.6 mg/dL (ref 0.2–1.2)
Total Protein: 7.2 g/dL (ref 6.0–8.3)

## 2018-12-17 LAB — LIPID PANEL
Cholesterol: 255 mg/dL — ABNORMAL HIGH (ref 0–200)
HDL: 65 mg/dL (ref >39.00)
LDL Cholesterol: 177 mg/dL — ABNORMAL HIGH (ref 0–99)
NonHDL: 189.59
Total CHOL/HDL Ratio: 4
Triglycerides: 65 mg/dL (ref 0.0–149.0)
VLDL: 13 mg/dL (ref 0.0–40.0)

## 2018-12-17 LAB — MICROALBUMIN / CREATININE URINE RATIO
Creatinine,U: 47.2 mg/dL
Microalb Creat Ratio: 1.5 mg/g (ref 0.0–30.0)
Microalb, Ur: <0.7 mg/dL (ref 0.0–1.9)

## 2018-12-17 LAB — TSH: TSH: 3.98 u[IU]/mL (ref 0.35–4.50)

## 2018-12-17 LAB — HEMOGLOBIN A1C: Hgb A1c MFr Bld: 7.1 % — ABNORMAL HIGH (ref 4.6–6.5)

## 2018-12-18 NOTE — Progress Notes (Signed)
Virtual Visit via Video Note  I connected with Cheryl Chan on 12/19/18 at  3:30 PM EDT by a video enabled telemedicine application and verified that I am speaking with the correct person using two identifiers.   I discussed the limitations of evaluation and management by telemedicine and the availability of in person appointments. The patient expressed understanding and agreed to proceed.  The patient is currently at home and I am in the office.    No referring provider.    History of Present Illness:   Social History   Socioeconomic History  . Marital status: Married    Spouse name: Not on file  . Number of children: Not on file  . Years of education: Not on file  . Highest education level: Not on file  Occupational History  . Not on file  Social Needs  . Financial resource strain: Not on file  . Food insecurity    Worry: Not on file    Inability: Not on file  . Transportation needs    Medical: Not on file    Non-medical: Not on file  Tobacco Use  . Smoking status: Never Smoker  . Smokeless tobacco: Never Used  Substance and Sexual Activity  . Alcohol use: Not Currently  . Drug use: No  . Sexual activity: Yes    Partners: Male    Birth control/protection: Surgical    Comment: hysterectomy  Lifestyle  . Physical activity    Days per week: Not on file    Minutes per session: Not on file  . Stress: Not on file  Relationships  . Social Herbalist on phone: Not on file    Gets together: Not on file    Attends religious service: Not on file    Active member of club or organization: Not on file    Attends meetings of clubs or organizations: Not on file    Relationship status: Not on file  Other Topics Concern  . 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   Follow Up Instructions:    I discussed the assessment and treatment plan with the patient. The patient was provided an opportunity to ask questions and all were answered. The patient agreed with the plan and demonstrated an understanding of the instructions.   The patient was advised to call back or seek an in-person evaluation if the symptoms worsen or if the condition fails to improve as anticipated.    Binnie Rail, MD  This encounter was created in error - please disregard.

## 2018-12-18 NOTE — Assessment & Plan Note (Signed)
Lab Results  Component Value Date   HGBA1C 7.1 (H) 12/17/2018   Sugars improved compared to 6 months ago She would like to continue to avoid medication and controlled with lifestyle Follow-up in 6 months

## 2018-12-18 NOTE — Assessment & Plan Note (Signed)
Intolerant to statins LDL very high in the setting of diabetes

## 2018-12-19 ENCOUNTER — Encounter: Payer: 59 | Admitting: Internal Medicine

## 2018-12-23 NOTE — Progress Notes (Signed)
Subjective:    Patient ID: Cheryl Chan, female    DOB: [birth year], 63 y.o.   MRN: [id number]  HPI The patient is here for follow up.  She is exercising regularly - yard work and caring for twin grandbabies.  She feels like her ears are clogged and is concerned about excessive earwax.  And has affected her hearing slightly.  Diabetes: She is controlling her sugars with lifestyle and would like to avoid medication. She is compliant with a diabetic diet.  She denies numbness/tingling in her feet and foot lesions. She is up-to-date with an ophthalmology examination.   Hypothyroidism:  She is taking her medication daily.  She denies any recent changes in energy or weight that are unexplained.   Hyperlipidemia: She is trying to control her cholesterol without medication. She is compliant with a low fat/cholesterol diet.   Depression: She is taking her medication daily as prescribed. She denies any side effects from the medication. She feels her depression is well controlled and she is happy with her current dose of medication.   Overweight:  She is exercising regularly.  She is eating healthy.  She did lose some weight, but ended up gaining some weight back recently  Brother has unusual heart disorder.  Her parents were just tested to see if it is genetic and she may need to be tested if 1 of them is positive.  She has already seen Dr. Haroldine Laws.  Medications and allergies reviewed with patient and updated if appropriate.  Patient Active Problem List   Diagnosis Date Noted  . Thyroid nodule 12/24/2018  . Osteoarthritis 04/22/2017  . Onychomycosis of toenail 09/25/2016  . Overweight 01/25/2015  . Rotator cuff injury   . Uterine cancer ([place]) 11/12/2011  . Diabetes ([place])   . Hypothyroidism   . Hyperlipidemia   . Depression   . Impacted cerumen of both ears     Current Outpatient Medications on File Prior to Visit  Medication Sig Dispense Refill  . fluticasone (FLONASE) 50  MCG/ACT nasal spray PLACE 2 SPRAYS INTO BOTH NOSTRILS DAILY. 48 g 2  . glucose blood (ACCU-CHEK GUIDE) test strip Test three times a day as needed 100 each 12  . Lancets (FREESTYLE) lancets Use as instructed 100 each 12  . sertraline (ZOLOFT) 100 MG tablet TAKE 1 TABLET (100 MG TOTAL) BY MOUTH DAILY. 90 tablet 1   No current facility-administered medications on file prior to visit.     Past Medical History:  Diagnosis Date  . Anxiety   . Arthritis   . Complex endometrial hyperplasia 2005  . 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 Diet-controlled type 2 diabetes mellitus ([place])   . Endometrial cancer ([place]) 10/2011 dx   s/p vag hyst 11/2011  . History of chicken pox   . Hyperlipidemia   . Hypothyroidism   . 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 Rotator cuff injury     Past Surgical History:  Procedure Laterality Date  . ABDOMINAL HYSTERECTOMY    . CESAREAN SECTION     x's 4  . DILATION AND CURETTAGE OF UTERUS     hysteroscopy  . FRACTURE SURGERY     nose as a child  . OPEN REDUCTION INTERNAL FIXATION (ORIF) DISTAL PHALANX Left 08/05/2012   Procedure: OPEN REDUCTION INTERNAL FIXATION (ORIF) DISTAL PHALANX VERSUS EXTERNAL FIXATION LEFT RING FINGER;  Surgeon: Tennis Must, MD;  Location: [facility];  Service: Orthopedics;  Laterality: Left;  . ROBOTIC ASSISTED LAP VAGINAL HYSTERECTOMY  12/04/2011   Procedure: ROBOTIC ASSISTED LAPAROSCOPIC VAGINAL HYSTERECTOMY;  Surgeon: Imagene Gurney A. Alycia Rossetti, MD;  Location: [facility];  Service: Gynecology;  Laterality: N/A;  . SHOULDER ARTHROSCOPY WITH ROTATOR CUFF REPAIR AND SUBACROMIAL DECOMPRESSION Left 11/27/2013   Procedure: LEFT SHOULDER ARTHROSCOPY, SUBACROMIAL DECOMPRESSION, PARTIAL ACROMIOPLASTY WITH  CORACOACROMIAL RELEASE, DISTAL CLAVICULECTOMY WITH ROTATOR CUFF REPAIR AND EXTENSIVE DEBRIDEMENT;  Surgeon: Lorn Junes, MD;  Location: [facility];  Service: Orthopedics;  Laterality: Left;    Social History    Socioeconomic History  . Marital status: Married    Spouse name: Not on file  . Number of children: Not on file  . Years of education: Not on file  . Highest education level: Not on file  Occupational History  . Not on file  Social Needs  . Financial resource strain: Not on file  . Food insecurity    Worry: Not on file    Inability: Not on file  . Transportation needs    Medical: Not on file    Non-medical: Not on file  Tobacco Use  . Smoking status: Never Smoker  . Smokeless tobacco: Never Used  Substance and Sexual Activity  . Alcohol use: Not Currently  . Drug use: No  . Sexual activity: Yes    Partners: Male    Birth control/protection: Surgical    Comment: hysterectomy  Lifestyle  . Physical activity    Days per week: Not on file    Minutes per session: Not on file  . Stress: Not on file  Relationships  . Social Herbalist on phone: Not on file    Gets together: Not on file    Attends religious service: Not on file    Active member of club or organization: Not on file    Attends meetings of clubs or organizations: Not on file    Relationship status: Not on file  Other Topics Concern  . 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 Arthritis Mother   . Cancer Father 2       prostrate  . Hypertension Father   . Stroke Father        in setting of sepsis f/ RMSF  . Heart disease Brother 85       cardiomyopathy s/p ICD, on transplant list  . Heart failure Brother   . Heart failure Other     Review of Systems  Constitutional: Negative for chills and fever.  Respiratory: Negative for cough, shortness of breath and wheezing.   Cardiovascular: Negative for chest pain, palpitations and leg swelling.  Neurological:  Negative for light-headedness and headaches.       Objective:   Vitals:   12/24/18 1440  BP: 140/74  Pulse: 74  Resp: 16  Temp: 98.8 F (37.1 C)  SpO2: 98%   BP Readings from Last 3 Encounters:  12/24/18 140/74  12/03/18 118/64  06/04/18 118/76   Wt Readings from Last 3 Encounters:  12/24/18 157 lb (71.2 kg)  12/03/18 152 lb (68.9 kg)  06/04/18 157 lb 12.8 oz (71.6 kg)   Body mass index is 28.72 kg/m.  Physical Exam    Constitutional: Appears well-developed and well-nourished. No distress.  HENT:  Head: Normocephalic and atraumatic.  Neck: Neck supple. No tracheal deviation present. No thyromegaly present, right sided thyroid nodule inferior aspect.  No cervical lymphadenopathy Cardiovascular: Normal rate, regular rhythm and normal heart sounds.   No murmur heard. No carotid bruit .  No edema Pulmonary/Chest: Effort normal and breath sounds normal. No respiratory distress. No has no wheezes. No rales.  Skin: Skin is warm and dry. Not diaphoretic.  Psychiatric: Normal mood and affect. Behavior is normal.      Assessment & Plan:    See Problem List for Assessment and Plan of chronic medical problems.

## 2018-12-23 NOTE — Patient Instructions (Addendum)
  Medications reviewed and updated.  Changes include :   Increase levothyroxine to 88 mcg daily  Your prescription(s) have been submitted to your pharmacy. Please take as directed and contact our office if you believe you are having problem(s) with the medication(s).    A thyroid ultrasound was ordered.   Please followup in 6 months

## 2018-12-24 ENCOUNTER — Ambulatory Visit (INDEPENDENT_AMBULATORY_CARE_PROVIDER_SITE_OTHER): Payer: 59 | Admitting: Internal Medicine

## 2018-12-24 ENCOUNTER — Other Ambulatory Visit: Payer: Self-pay

## 2018-12-24 ENCOUNTER — Encounter: Payer: Self-pay | Admitting: Internal Medicine

## 2018-12-24 VITALS — BP 140/74 | HR 74 | Temp 98.8°F | Resp 16 | Ht 62.0 in | Wt 157.0 lb

## 2018-12-24 DIAGNOSIS — E7849 Other hyperlipidemia: Secondary | ICD-10-CM | POA: Diagnosis not present

## 2018-12-24 DIAGNOSIS — E039 Hypothyroidism, unspecified: Secondary | ICD-10-CM | POA: Diagnosis not present

## 2018-12-24 DIAGNOSIS — H6123 Impacted cerumen, bilateral: Secondary | ICD-10-CM

## 2018-12-24 DIAGNOSIS — F329 Major depressive disorder, single episode, unspecified: Secondary | ICD-10-CM

## 2018-12-24 DIAGNOSIS — E119 Type 2 diabetes mellitus without complications: Secondary | ICD-10-CM | POA: Diagnosis not present

## 2018-12-24 DIAGNOSIS — F32A Depression, unspecified: Secondary | ICD-10-CM

## 2018-12-24 DIAGNOSIS — E663 Overweight: Secondary | ICD-10-CM

## 2018-12-24 DIAGNOSIS — E041 Nontoxic single thyroid nodule: Secondary | ICD-10-CM | POA: Diagnosis not present

## 2018-12-24 MED ORDER — LEVOTHYROXINE SODIUM 88 MCG PO TABS: 88.0000 ug | ORAL_TABLET | Freq: Every day | ORAL | 1 refills | Status: DC

## 2018-12-24 MED FILL — LEVOTHYROXINE 88 MCG TABLET: 88 | 90 days supply | Qty: 90 | Fill #0

## 2018-12-24 NOTE — Progress Notes (Signed)
Patient consent obtained. Irrigation with water and peroxide performed. Full view of tympanic membranes after procedure.  Patient tolerated procedure well. Bilateral ear

## 2018-12-24 NOTE — Assessment & Plan Note (Signed)
Would like to avoid medication-has not tolerated statins in the past Will work on increasing exercise and weight loss Healthy diet Follow-up in 6 months

## 2018-12-24 NOTE — Assessment & Plan Note (Signed)
TSH on high side, but still normal Will increase levothyroxine to 88 mcg Recheck TSH at next visit

## 2018-12-24 NOTE — Assessment & Plan Note (Signed)
A1c 7.1% Controlled with diet Fairly controlled She would like to continue to avoid medication Continue diabetic diet Stressed the importance of regular exercise She will work on weight loss Follow-up in 6 months

## 2018-12-24 NOTE — Assessment & Plan Note (Signed)
Controlled, stable Continue current dose of medication  

## 2018-12-24 NOTE — Assessment & Plan Note (Signed)
With diabetes, hyperlipidemia Working on weight loss Will make exercise more regular  follow-up in 6 months

## 2018-12-24 NOTE — Assessment & Plan Note (Signed)
Bilateral air successfully cleaned out by CMA-see procedure note Symptoms improved

## 2018-12-24 NOTE — Assessment & Plan Note (Signed)
Thyroid ultrasound ordered.

## 2018-12-26 ENCOUNTER — Other Ambulatory Visit: Payer: Self-pay

## 2018-12-26 DIAGNOSIS — Z20822 Contact with and (suspected) exposure to covid-19: Secondary | ICD-10-CM

## 2018-12-26 MED FILL — FLUTICASONE PROP 50 MCG SPR: 50 | 90 days supply | Qty: 48 | Fill #1

## 2018-12-27 LAB — NOVEL CORONAVIRUS, NAA: SARS-CoV-2, NAA: NOT DETECTED

## 2019-01-05 ENCOUNTER — Ambulatory Visit
Admission: RE | Admit: 2019-01-05 | Discharge: 2019-01-05 | Disposition: A | Discharge status: Home / Self Care | Payer: 59 | Source: Ambulatory Visit | Attending: Internal Medicine | Admitting: Internal Medicine

## 2019-01-05 ENCOUNTER — Other Ambulatory Visit: Payer: Self-pay

## 2019-01-05 DIAGNOSIS — E041 Nontoxic single thyroid nodule: Secondary | ICD-10-CM

## 2019-01-06 ENCOUNTER — Encounter: Payer: Self-pay | Admitting: Internal Medicine

## 2019-01-14 ENCOUNTER — Ambulatory Visit: Payer: 59 | Admitting: Family Medicine

## 2019-01-29 ENCOUNTER — Other Ambulatory Visit: Payer: Self-pay | Admitting: Internal Medicine

## 2019-01-29 MED FILL — SERTRALINE HCL 100 MG TAB: 100 | 90 days supply | Qty: 90 | Fill #0

## 2019-03-05 MED FILL — FLUARIX QUADRIVALENT 0.5 ML: 0.5 | 1 days supply | Qty: 1 | Fill #0

## 2019-03-29 ENCOUNTER — Telehealth: Payer: 59 | Admitting: Family

## 2019-03-29 DIAGNOSIS — B029 Zoster without complications: Secondary | ICD-10-CM

## 2019-03-29 MED ORDER — VALACYCLOVIR HCL 1 G PO TABS: 1000.0000 mg | ORAL_TABLET | Freq: Three times a day (TID) | ORAL | 0 refills | Status: DC

## 2019-03-29 NOTE — Progress Notes (Signed)
E-visit for Shingles   We are sorry that you are not feeling well. Here is how we plan to help!  Based on what you shared with me it looks like you have shingles.  Shingles or herpes zoster, is a common infection of the nerves.  It is a painful rash caused by the herpes zoster virus.  This is the same virus that causes chickenpox.  After a person has chickenpox, the virus remains inactive in the nerve cells.  Years later, the virus can become active again and travel to the skin.  It typically will appear on one side of the face or body.  Burning or shooting pain, tingling, or itching are early signs of the infection.  Blisters typically scab over in 7 to 10 days and clear up within 2-4 weeks. Shingles is only contagious to people that have never had the chickenpox, the chickenpox vaccine, or anyone who has a compromised immune system.  You should avoid contact with these type of people until your blisters scab over.  I have prescribed Valacyclovir 1g three times daily for 7 days.    HOME CARE: Apply ice packs (wrapped in a thin towel), cool compresses, or soak in cool bath to help reduce pain. Use calamine lotion to calm itchy skin. Avoid scratching the rash. Avoid direct sunlight.  GET HELP RIGHT AWAY IF: Symptoms that don't away after treatment. A rash or blisters near your eye. Increased drainage, fever, or rash after treatment. Severe pain that doesn't go away.   MAKE SURE YOU   Understand these instructions. Will watch your condition. Will get help right away if you are not doing well or get worse.  Thank you for choosing an e-visit.  Your e-visit answers were reviewed by a board certified advanced clinical practitioner to complete your personal care plan. Depending upon the condition, your plan could have included both over the counter or prescription medications.  Please review your pharmacy choice. Make sure the pharmacy is open so you can pick up prescription now. If there  is a problem, you may contact your provider through MyChart messaging and have the prescription routed to another pharmacy.  Your safety is important to us. If you have drug allergies check your prescription carefully.   For the next 24 hours you can use MyChart to ask questions about today's visit, request a non-urgent call back, or ask for a work or school excuse. You will get an email in the next two days asking about your experience. I hope that your e-visit has been valuable and will speed your recovery.  Approximately 5 minutes was spent documenting and reviewing patient's chart.    

## 2019-03-30 MED FILL — FLUTICASONE PROP 50 MCG SPR: 50 | 90 days supply | Qty: 48 | Fill #2

## 2019-03-30 MED FILL — LEVOTHYROXINE 88 MCG TABLET: 88 | 90 days supply | Qty: 90 | Fill #1

## 2019-04-01 DIAGNOSIS — B029 Zoster without complications: Secondary | ICD-10-CM | POA: Diagnosis not present

## 2019-04-01 DIAGNOSIS — D2261 Melanocytic nevi of right upper limb, including shoulder: Secondary | ICD-10-CM | POA: Diagnosis not present

## 2019-04-01 DIAGNOSIS — D1801 Hemangioma of skin and subcutaneous tissue: Secondary | ICD-10-CM | POA: Diagnosis not present

## 2019-04-01 DIAGNOSIS — L814 Other melanin hyperpigmentation: Secondary | ICD-10-CM | POA: Diagnosis not present

## 2019-04-01 DIAGNOSIS — D2262 Melanocytic nevi of left upper limb, including shoulder: Secondary | ICD-10-CM | POA: Diagnosis not present

## 2019-04-01 DIAGNOSIS — D225 Melanocytic nevi of trunk: Secondary | ICD-10-CM | POA: Diagnosis not present

## 2019-04-01 DIAGNOSIS — D2371 Other benign neoplasm of skin of right lower limb, including hip: Secondary | ICD-10-CM | POA: Diagnosis not present

## 2019-04-01 DIAGNOSIS — L821 Other seborrheic keratosis: Secondary | ICD-10-CM | POA: Diagnosis not present

## 2019-04-18 DIAGNOSIS — Z20828 Contact with and (suspected) exposure to other viral communicable diseases: Secondary | ICD-10-CM | POA: Diagnosis not present

## 2019-05-04 ENCOUNTER — Other Ambulatory Visit: Payer: Self-pay

## 2019-05-04 NOTE — Progress Notes (Signed)
63 y.o. [id number] Married  Caucasian Fe here for annual exam. Menopausal no symptoms now. Still having some vaginal dryness. No change in diabetes status, she is not treating at this point. Has noted occasional stress incontinence at hs only. Sees Stacy Burns,MD for aex and thyroid/depression/glucose management all stable. Up to date with immunizations now. No health issues today.  Patient's last menstrual period was 05/22/2003.          Sexually active: Yes.    The current method of family planning is status post hysterectomy.    Exercising: Yes.    walking Smoker:  no  Review of Systems  Constitutional: Negative.   HENT: Negative.   Eyes: Negative.   Respiratory: Negative.   Cardiovascular: Negative.   Gastrointestinal: Negative.   Genitourinary: Negative.   Musculoskeletal: Negative.   Skin: Negative.   Neurological: Negative.   Endo/Heme/Allergies: Negative.   Psychiatric/Behavioral: Negative.     Health Maintenance: Pap:  03-13-17 neg, 04-30-18 neg HPV HR neg History of Abnormal Pap: endometrial cancer, hysterectomy MMG:  06-05-2018 category b density birads 1:neg Self Breast exams: somewhat Colonoscopy:  2018 normal f/u 56yrs BMD:   2015 TDaP:  2014 Shingles: 2018 Pneumonia: 2014 Hep C and HIV: both neg 2016 Labs: if needed.   reports that she has never smoked. She has never used smokeless tobacco. She reports previous alcohol use. She reports that she does not use drugs.  Past Medical History:  Diagnosis Date  . Anxiety   . Arthritis   . Complex endometrial hyperplasia 2005  . 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 Diet-controlled type 2 diabetes mellitus ([place])   . Endometrial cancer ([place]) 10/2011 dx   s/p vag hyst 11/2011  . History of chicken pox   . Hyperlipidemia   . Hypothyroidism   . 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 Rotator cuff injury   . Shingles     Past Surgical History:  Procedure Laterality Date  . ABDOMINAL HYSTERECTOMY    .  CESAREAN SECTION     x's 4  . DILATION AND CURETTAGE OF UTERUS     hysteroscopy  . FRACTURE SURGERY     nose as a child  . OPEN REDUCTION INTERNAL FIXATION (ORIF) DISTAL PHALANX Left 08/05/2012   Procedure: OPEN REDUCTION INTERNAL FIXATION (ORIF) DISTAL PHALANX VERSUS EXTERNAL FIXATION LEFT RING FINGER;  Surgeon: Tennis Must, MD;  Location: [facility];  Service: Orthopedics;  Laterality: Left;  . ROBOTIC ASSISTED LAP VAGINAL HYSTERECTOMY  12/04/2011   Procedure: ROBOTIC ASSISTED LAPAROSCOPIC VAGINAL HYSTERECTOMY;  Surgeon: Imagene Gurney A. Alycia Rossetti, MD;  Location: [facility];  Service: Gynecology;  Laterality: N/A;  . SHOULDER ARTHROSCOPY WITH ROTATOR CUFF REPAIR AND SUBACROMIAL DECOMPRESSION Left 11/27/2013   Procedure: LEFT SHOULDER ARTHROSCOPY, SUBACROMIAL DECOMPRESSION, PARTIAL ACROMIOPLASTY WITH  CORACOACROMIAL RELEASE, DISTAL CLAVICULECTOMY WITH ROTATOR CUFF REPAIR AND EXTENSIVE DEBRIDEMENT;  Surgeon: Lorn Junes, MD;  Location: [facility];  Service: Orthopedics;  Laterality: Left;    Current Outpatient Medications  Medication Sig Dispense Refill  . fluticasone (FLONASE) 50 MCG/ACT nasal spray PLACE 2 SPRAYS INTO BOTH NOSTRILS DAILY. 48 g 2  . glucose blood (ACCU-CHEK GUIDE) test strip Test three times a day as needed 100 each 12  . Lancets (FREESTYLE) lancets Use as instructed 100 each 12  . levothyroxine (SYNTHROID) 88 MCG tablet Take 1 tablet (88 mcg total) by mouth daily before breakfast. 90 tablet 1  . sertraline (ZOLOFT) 100 MG tablet  TAKE 1 TABLET (100 MG TOTAL) BY MOUTH DAILY. 90 tablet 1   No current facility-administered medications for this visit.    Family History  Problem Relation Age of Onset  . Arthritis Mother   . Cancer Father 73       prostrate  . Hypertension Father   . Stroke Father        in setting of sepsis f/ RMSF  . Heart disease Brother 68       cardiomyopathy s/p ICD, on transplant list  . Heart failure Brother   . Heart failure  Other     ROS:  Pertinent items are noted in HPI.  Otherwise, a comprehensive ROS was negative.  Exam:   BP 108/64   Pulse 72   Temp (!) 97.2 F (36.2 C) (Skin)   Resp 16   Ht 5' 2.25" (1.581 m)   Wt 161 lb (73 kg)   LMP 05/22/2003   BMI 29.21 kg/m  Height: 5' 2.25" (158.1 cm) Ht Readings from Last 3 Encounters:  05/05/19 5' 2.25" (1.581 m)  12/24/18 5\' 2"  (1.575 m)  12/03/18 5\' 2"  (1.575 m)    General appearance: alert, cooperative and appears stated age Head: Normocephalic, without obvious abnormality, atraumatic Neck: no adenopathy, supple, symmetrical, trachea midline and thyroid normal to inspection and palpation Lungs: clear to auscultation bilaterally Breasts: normal appearance, no masses or tenderness, No nipple retraction or dimpling, No nipple discharge or bleeding, No axillary or supraclavicular adenopathy Heart: regular rate and rhythm Abdomen: soft, non-tender; no masses,  no organomegaly Extremities: extremities normal, atraumatic, no cyanosis or edema Skin: Skin color, texture, turgor normal. No rashes or lesions Lymph nodes: Cervical, supraclavicular, and axillary nodes normal. No abnormal inguinal nodes palpated Neurologic: Grossly normal   Pelvic: External genitalia:  no lesions, normal female              Urethra:  normal appearing urethra with no masses, tenderness or lesions              Bartholin's and Skene's: normal                 Vagina: normal appearing vagina with normal color and discharge, no lesions              Cervix: absent              Pap taken: Yes.   Bimanual Exam:  Uterus:  uterus absent              Adnexa: no mass, fullness, tenderness               Rectovaginal: Confirms               Anus:  normal sphincter tone, no lesions  Chaperone present: yes  A:  Well Woman with normal exam  Menopausal no HRT s/p TVH for endometrial cancer  Hypertension, diabetes, depression management with PCP, all stable    P:   Reviewed health  and wellness pertinent to exam  Aware of need to advise if vaginal dryness or bleeding  Continue follow up with PCP as indicated  Bone density due in 2021  Pap smear: yes   counseled on breast self exam, mammography screening, feminine hygiene, menopause, adequate intake of calcium and vitamin D, diet and exercise, Kegel's exercises  return annually or prn  An After Visit Summary was printed and given to the patient.

## 2019-05-05 ENCOUNTER — Other Ambulatory Visit (HOSPITAL_COMMUNITY)
Admission: RE | Admit: 2019-05-05 | Discharge: 2019-05-05 | Disposition: A | Discharge status: Home / Self Care | Payer: 59 | Source: Ambulatory Visit | Attending: Obstetrics & Gynecology | Admitting: Obstetrics & Gynecology

## 2019-05-05 ENCOUNTER — Encounter: Payer: Self-pay | Admitting: Certified Nurse Midwife

## 2019-05-05 ENCOUNTER — Other Ambulatory Visit: Payer: Self-pay

## 2019-05-05 ENCOUNTER — Ambulatory Visit (INDEPENDENT_AMBULATORY_CARE_PROVIDER_SITE_OTHER): Payer: 59 | Admitting: Certified Nurse Midwife

## 2019-05-05 VITALS — BP 108/64 | HR 72 | Temp 97.2°F | Resp 16 | Ht 62.25 in | Wt 161.0 lb

## 2019-05-05 DIAGNOSIS — Z01419 Encounter for gynecological examination (general) (routine) without abnormal findings: Secondary | ICD-10-CM

## 2019-05-05 DIAGNOSIS — Z124 Encounter for screening for malignant neoplasm of cervix: Secondary | ICD-10-CM | POA: Diagnosis not present

## 2019-05-05 NOTE — Patient Instructions (Signed)

## 2019-05-07 LAB — CYTOLOGY - PAP
Comment: NEGATIVE
Diagnosis: NEGATIVE
High risk HPV: NEGATIVE

## 2019-05-07 NOTE — Progress Notes (Signed)
Pap smear is negative HPV is negative 02 repeat pap  yearly due to endometrial cancer

## 2019-05-26 MED FILL — SERTRALINE HCL 100 MG TAB: 100 | 30 days supply | Qty: 30 | Fill #1

## 2019-05-31 ENCOUNTER — Ambulatory Visit: Payer: 59 | Attending: Internal Medicine

## 2019-05-31 DIAGNOSIS — Z23 Encounter for immunization: Secondary | ICD-10-CM | POA: Diagnosis not present

## 2019-05-31 NOTE — Progress Notes (Signed)
   Covid-19 Vaccination Clinic  Name:  Cheryl Chan    MRN: [id number] DOB: Sep 05, 1955  05/31/2019  [name] was observed post Covid-19 immunization for 15 minutes without incidence. She was provided with Vaccine Information Sheet and instruction to access the V-Safe system.   [name] was instructed to call 911 with any severe reactions post vaccine: Marland Kitchen Difficulty breathing  . Swelling of your face and throat  . A fast heartbeat  . A bad rash all over your body  . Dizziness and weakness    Immunizations Administered    Name Date Dose VIS Date Route   Pfizer COVID-19 Vaccine 05/31/2019  2:35 PM 0.3 mL 05/01/2019 Intramuscular   Manufacturer: [place]   Lot: [id number]   [place]: [id number]

## 2019-06-10 ENCOUNTER — Encounter: Payer: Self-pay | Admitting: Internal Medicine

## 2019-06-10 DIAGNOSIS — E039 Hypothyroidism, unspecified: Secondary | ICD-10-CM

## 2019-06-10 DIAGNOSIS — E119 Type 2 diabetes mellitus without complications: Secondary | ICD-10-CM

## 2019-06-10 DIAGNOSIS — E7849 Other hyperlipidemia: Secondary | ICD-10-CM

## 2019-06-11 ENCOUNTER — Other Ambulatory Visit: Payer: Self-pay | Admitting: Certified Nurse Midwife

## 2019-06-11 DIAGNOSIS — Z1231 Encounter for screening mammogram for malignant neoplasm of breast: Secondary | ICD-10-CM

## 2019-06-12 ENCOUNTER — Other Ambulatory Visit (INDEPENDENT_AMBULATORY_CARE_PROVIDER_SITE_OTHER): Payer: 59

## 2019-06-12 ENCOUNTER — Other Ambulatory Visit: Payer: Self-pay

## 2019-06-12 DIAGNOSIS — E039 Hypothyroidism, unspecified: Secondary | ICD-10-CM | POA: Diagnosis not present

## 2019-06-12 DIAGNOSIS — E119 Type 2 diabetes mellitus without complications: Secondary | ICD-10-CM | POA: Diagnosis not present

## 2019-06-12 DIAGNOSIS — E7849 Other hyperlipidemia: Secondary | ICD-10-CM | POA: Diagnosis not present

## 2019-06-12 LAB — COMPREHENSIVE METABOLIC PANEL
ALT: 13 U/L (ref 0–35)
AST: 14 U/L (ref 0–37)
Albumin: 4.3 g/dL (ref 3.5–5.2)
Alkaline Phosphatase: 76 U/L (ref 39–117)
BUN: 18 mg/dL (ref 6–23)
CO2: 29 mEq/L (ref 19–32)
Calcium: 9.1 mg/dL (ref 8.4–10.5)
Chloride: 99 mEq/L (ref 96–112)
Creatinine, Ser: 0.6 mg/dL (ref 0.40–1.20)
GFR: 100.95 mL/min (ref >60.00)
Glucose, Bld: 183 mg/dL — ABNORMAL HIGH (ref 70–99)
Potassium: 4.1 mEq/L (ref 3.5–5.1)
Sodium: 135 mEq/L (ref 135–145)
Total Bilirubin: 0.7 mg/dL (ref 0.2–1.2)
Total Protein: 7.5 g/dL (ref 6.0–8.3)

## 2019-06-12 LAB — LDL CHOLESTEROL, DIRECT: Direct LDL: 195 mg/dL

## 2019-06-12 LAB — LIPID PANEL
Cholesterol: 285 mg/dL — ABNORMAL HIGH (ref 0–200)
HDL: 55.7 mg/dL (ref >39.00)
NonHDL: 228.95
Total CHOL/HDL Ratio: 5
Triglycerides: 220 mg/dL — ABNORMAL HIGH (ref 0.0–149.0)
VLDL: 44 mg/dL — ABNORMAL HIGH (ref 0.0–40.0)

## 2019-06-12 LAB — TSH: TSH: 5.83 u[IU]/mL — ABNORMAL HIGH (ref 0.35–4.50)

## 2019-06-12 LAB — HEMOGLOBIN A1C: Hgb A1c MFr Bld: 8.2 % — ABNORMAL HIGH (ref 4.6–6.5)

## 2019-06-15 ENCOUNTER — Other Ambulatory Visit: Payer: Self-pay

## 2019-06-15 ENCOUNTER — Ambulatory Visit (INDEPENDENT_AMBULATORY_CARE_PROVIDER_SITE_OTHER): Payer: 59 | Admitting: Internal Medicine

## 2019-06-15 ENCOUNTER — Ambulatory Visit (INDEPENDENT_AMBULATORY_CARE_PROVIDER_SITE_OTHER): Payer: 59 | Admitting: Family Medicine

## 2019-06-15 ENCOUNTER — Encounter: Payer: Self-pay | Admitting: Internal Medicine

## 2019-06-15 ENCOUNTER — Encounter: Payer: Self-pay | Admitting: Family Medicine

## 2019-06-15 VITALS — BP 132/80 | Ht 62.0 in | Wt 159.0 lb

## 2019-06-15 DIAGNOSIS — E039 Hypothyroidism, unspecified: Secondary | ICD-10-CM

## 2019-06-15 DIAGNOSIS — E1165 Type 2 diabetes mellitus with hyperglycemia: Secondary | ICD-10-CM | POA: Diagnosis not present

## 2019-06-15 DIAGNOSIS — E7849 Other hyperlipidemia: Secondary | ICD-10-CM

## 2019-06-15 DIAGNOSIS — M25511 Pain in right shoulder: Secondary | ICD-10-CM | POA: Diagnosis not present

## 2019-06-15 MED ORDER — FREESTYLE LIBRE READER DEVI: 1.0000 | 5 refills | Status: DC

## 2019-06-15 MED ORDER — METHYLPREDNISOLONE ACETATE 40 MG/ML IJ SUSP
40.0000 mg | Freq: Once | INTRAMUSCULAR | Status: AC
  Administered 2019-06-15: 40 mg via INTRA_ARTICULAR

## 2019-06-15 MED ORDER — NITROGLYCERIN 0.2 MG/HR TD PT24: MEDICATED_PATCH | TRANSDERMAL | 1 refills | Status: DC

## 2019-06-15 MED ORDER — FREESTYLE LIBRE 14 DAY SENSOR MISC: 0 refills | Status: DC

## 2019-06-15 MED ORDER — FARXIGA 5 MG PO TABS: 5.0000 mg | ORAL_TABLET | Freq: Every day | ORAL | 5 refills | Status: DC

## 2019-06-15 MED FILL — FARXIGA 5 MG TABLET: 5 | 30 days supply | Qty: 30 | Fill #0

## 2019-06-15 MED FILL — NITROGLYCERIN 0.2 MG/HR PTC: 0.2 | 30 days supply | Qty: 8 | Fill #0

## 2019-06-15 NOTE — Progress Notes (Signed)
Virtual Visit via Video Note  I connected with Cheryl Chan on 06/15/19 at  2:30 PM EST by a video enabled telemedicine application and verified that I am speaking with the correct person using two identifiers.   I discussed the limitations of evaluation and management by telemedicine and the availability of in person appointments. The patient expressed understanding and agreed to proceed.  Present for the visit:  Myself, Dr Billey Gosling, Leana Gamer.  The patient is currently at home and I am in the office.    No referring provider.    History of Present Illness: She is here for follow up of her chronic medical conditions.  She is not exercising regularly.   She had been exercising very regularly, but is not doing that now.  She does walk you regularly and plans on getting back to that regular walking.  She is not on any medication for her cholesterol or sugars.  She was not taking her thyroid medication on a daily basis.  She thought she was, but when she reevaluated her medication she realized she had missed some days.  At this point she does not feel that she needs the medication dose adjusted-just needs to be more regimented about taking it.  She has not been as compliant with a diabetic diet and has gained some weight.  She plans on getting back to her routine she was in several months ago and her sugars are well controlled.  She is already started to make some changes and has seen improvement in her numbers.  She would like to monitor her sugars more closely to help improve her sugars overall.  She would consider medication for her sugars.  She has not tolerated statins in the past-she has been on at least 2, but probably more.  She is also not tolerated WelChol and Zetia.      Review of Systems  Constitutional: Negative for fever.  Respiratory: Negative for cough, sputum production and wheezing.   Cardiovascular: Negative for chest pain, palpitations and leg swelling.   Neurological: Positive for dizziness (mild lightheadedness/dizziness with / sugars being elevated).      Social History   Socioeconomic History  . Marital status: Married    Spouse name: Not on file  . Number of children: Not on file  . Years of education: Not on file  . Highest education level: Not on file  Occupational History  . Not on file  Tobacco Use  . Smoking status: Never Smoker  . Smokeless tobacco: Never Used  Substance and Sexual Activity  . Alcohol use: Not Currently  . Drug use: No  . Sexual activity: Yes    Partners: Male    Birth control/protection: Surgical    Comment: hysterectomy  Other Topics Concern  . 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 Difficulty of Paying Living Expenses: Not on file  Food Insecurity:   . Worried About Charity fundraiser in the Last Year: Not on file  . Ran Out of Food in the Last Year: Not on file  Transportation Needs:   . Lack of Transportation (Medical): Not on file  . Lack of Transportation (Non-Medical): Not  on file  Physical Activity:   . Days of Exercise per Week: Not on file  . Minutes of Exercise per Session: Not on file  Stress:   . Feeling of Stress : Not on file  Social Connections:   . Frequency of Communication with Friends and Family: Not on file  . Frequency of Social Gatherings with Friends and Family: Not on file  . Attends Religious Services: Not on file  . Active Member of Clubs or Organizations: Not on file  . Attends Archivist Meetings: Not on file  . Marital Status: Not on file     Observations/Objective: Appears well in NAD Breathing normally Skin appears warm and dry   Lab Results  Component Value Date   WBC 6.0 12/17/2018    HGB 13.0 12/17/2018   HCT 39.4 12/17/2018   PLT 262.0 12/17/2018   GLUCOSE 183 (H) 06/12/2019   CHOL 285 (H) 06/12/2019   TRIG 220.0 (H) 06/12/2019   HDL 55.70 06/12/2019   LDLDIRECT 195.0 06/12/2019   LDLCALC 177 (H) 12/17/2018   ALT 13 06/12/2019   AST 14 06/12/2019   NA 135 06/12/2019   K 4.1 06/12/2019   CL 99 06/12/2019   CREATININE 0.60 06/12/2019   BUN 18 06/12/2019   CO2 29 06/12/2019   TSH 5.83 (H) 06/12/2019   HGBA1C 8.2 (H) 06/12/2019   MICROALBUR <0.7 12/17/2018    Assessment and Plan:  See Problem List for Assessment and Plan of chronic medical problems.   Follow Up Instructions:    I discussed the assessment and treatment plan with the patient. The patient was provided an opportunity to ask questions and all were answered. The patient agreed with the plan and demonstrated an understanding of the instructions.   The patient was advised to call back or seek an in-person evaluation if the symptoms worsen or if the condition fails to improve as anticipated.  Follow-up in 6 months.  She will get blood work done in approximately 3 months to recheck her TSH and A1c.  Advised that she needs to call to have that scheduled.  Binnie Rail, MD

## 2019-06-15 NOTE — Assessment & Plan Note (Addendum)
Has not tolerated statins including Crestor, Lipitor and Vytorin Has been on WelChol in the past Did not tolerate Zetia She will increase her exercise She will work on weight loss She will work on diet improvements Discussed lipid clinic and she is interested in going for an appointment-May benefit from PCSK9 inhibitor

## 2019-06-15 NOTE — Patient Instructions (Signed)
You have rotator cuff impingement Try to avoid painful activities (overhead activities, lifting with extended arm) as much as possible. Aleve 2 tabs twice a day with food OR ibuprofen 3 tabs three times a day with food for pain and inflammation if needed. Can take tylenol in addition to this. Nitro patches - 1/4th patch to affected area, change daily Subacromial injection may be beneficial to help with pain and to decrease inflammation - you were given this today. Start physical therapy and do home exercises on days you don't go to therapy. Do home exercise program with theraband and scapular stabilization exercises daily 3 sets of 10 once a day. Follow up with me in 6 weeks.

## 2019-06-15 NOTE — Progress Notes (Signed)
PCP: Binnie Rail, MD  Subjective:   HPI: Patient is a 64 y.o. female here for right shoulder pain.  12/03/18: Pt is here c/o chronic R shoulder pain. Pain started 1 month ago, seeking care today because it was starting to wake her up from sleep. No MOI or new trauma, seen almost 1 year ago for similar symptoms and received subacromial CSI at that time that offered relief for 6 months in her shoulder and in her hand. Aggravating factors include reaching arm overhead. Alleviating factors include rest. No radiating symptoms. Describes the pain as dull and rates pain 7/10.   06/15/19: Patient returns with recurring pain in lateral right shoulder. Pain worse lateral shoulder with reaching. Has been doing a lot of painting at home, gardening in her free time. Gets radiation into hand. History of rotator cuff repair remotely. No numbness.  Past Medical History:  Diagnosis Date  . Anxiety   . Arthritis   . Complex endometrial hyperplasia 2005  . 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 Diet-controlled type 2 diabetes mellitus ([place])   . Endometrial cancer ([place]) 10/2011 dx   s/p vag hyst 11/2011  . History of chicken pox   . Hyperlipidemia   . Hypothyroidism   . 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 Rotator cuff injury   . Shingles     Current Outpatient Medications on File Prior to Visit  Medication Sig Dispense Refill  . fluticasone (FLONASE) 50 MCG/ACT nasal spray PLACE 2 SPRAYS INTO BOTH NOSTRILS DAILY. 48 g 2  . glucose blood (ACCU-CHEK GUIDE) test strip Test three times a day as needed 100 each 12  . Lancets (FREESTYLE) lancets Use as instructed 100 each 12  . levothyroxine (SYNTHROID) 88 MCG tablet Take 1 tablet (88 mcg total) by mouth daily before breakfast. 90 tablet 1   No current facility-administered medications on file prior to visit.    Past Surgical History:  Procedure Laterality Date  . ABDOMINAL HYSTERECTOMY    . CESAREAN SECTION     x's 4  . DILATION  AND CURETTAGE OF UTERUS     hysteroscopy  . FRACTURE SURGERY     nose as a child  . OPEN REDUCTION INTERNAL FIXATION (ORIF) DISTAL PHALANX Left 08/05/2012   Procedure: OPEN REDUCTION INTERNAL FIXATION (ORIF) DISTAL PHALANX VERSUS EXTERNAL FIXATION LEFT RING FINGER;  Surgeon: Tennis Must, MD;  Location: [facility];  Service: Orthopedics;  Laterality: Left;  . ROBOTIC ASSISTED LAP VAGINAL HYSTERECTOMY  12/04/2011   Procedure: ROBOTIC ASSISTED LAPAROSCOPIC VAGINAL HYSTERECTOMY;  Surgeon: Imagene Gurney A. Alycia Rossetti, MD;  Location: [facility];  Service: Gynecology;  Laterality: N/A;  . SHOULDER ARTHROSCOPY WITH ROTATOR CUFF REPAIR AND SUBACROMIAL DECOMPRESSION Left 11/27/2013   Procedure: LEFT SHOULDER ARTHROSCOPY, SUBACROMIAL DECOMPRESSION, PARTIAL ACROMIOPLASTY WITH  CORACOACROMIAL RELEASE, DISTAL CLAVICULECTOMY WITH ROTATOR CUFF REPAIR AND EXTENSIVE DEBRIDEMENT;  Surgeon: Lorn Junes, MD;  Location: [facility];  Service: Orthopedics;  Laterality: Left;    Allergies  Allergen Reactions  . Crestor [Rosuvastatin Calcium]     Muscle ache  . Demerol [Meperidine]   . Statins   . Trulicity [Dulaglutide]     Nausea, diarrhea  . Victoza [Liraglutide]     Nausea, diarrhea  . Metformin And Related     diarrhea    Social History   Socioeconomic History  . Marital status: Married    Spouse name: Not on file  . Number of children: Not on  file  . Years of education: Not on file  . Highest education level: Not on file  Occupational History  . Not on file  Tobacco Use  . Smoking status: Never Smoker  . Smokeless tobacco: Never Used  Substance and Sexual Activity  . Alcohol use: Not Currently  . Drug use: No  . Sexual activity: Yes    Partners: Male    Birth control/protection: Surgical    Comment: hysterectomy  Other Topics Concern  . 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 Difficulty of Paying Living Expenses: Not on file  Food Insecurity:   . Worried About Charity fundraiser in the Last Year: Not on file  . Ran Out of Food in the Last Year: Not on file  Transportation Needs:   . Lack of Transportation (Medical): Not on file  . Lack of Transportation (Non-Medical): Not on file  Physical Activity:   . Days of Exercise per Week: Not on file  . Minutes of Exercise per Session: Not on file  Stress:   . Feeling of Stress : Not on file  Social Connections:   . Frequency of Communication with Friends and Family: Not on file  . Frequency of Social Gatherings with Friends and Family: Not on file  . Attends Religious Services: Not on file  . Active Member of Clubs or Organizations: Not on file  . Attends Archivist Meetings: Not on file  . Marital Status: Not on file  Intimate Partner Violence:   . Fear of Current or Ex-Partner: Not on file  . Emotionally Abused: Not on file  . Physically Abused: Not on file  . Sexually Abused: Not on file    Family History  Problem Relation Age of Onset  . Arthritis Mother   . Cancer Father 16       prostrate  . Hypertension Father   . Stroke Father        in setting of sepsis f/ RMSF  . Heart disease Brother 46       cardiomyopathy s/p ICD, on transplant list  . Heart failure Brother   . Heart failure Other     BP 132/80   Ht 5\' 2"  (1.575 m)   Wt 159 lb (72.1 kg)   LMP 05/22/2003   BMI 29.08 kg/m   Review of Systems: See HPI above.     Objective:  Physical Exam:  Gen: NAD, comfortable in exam room  Right shoulder: No swelling, ecchymoses.  No gross deformity. No TTP AC joint, biceps tendon. FROM with mild painful arc. Negative Hawkins, Neers. Negative Yergasons. Strength 5/5 with empty can and resisted internal/external rotation.   Pain ER. Negative apprehension. NV intact distally.   Assessment & Plan:  1. Right shoulder pain - chronic, 2/2 impingement.  She will start formal physical therapy and nitro patches.  We discussed risks of repeating injections and effect on rotator cuff - given severity of pain repeated this today.  Brief MSK u/s without full thickness tears seen of supraspinatus, infraspinatus, subscapularis.  Aleve or ibuprofen if needed.  F/u in 6 weeks.  After informed written consent timeout was performed, patient was seated in  chair in exam room. Right shoulder was prepped with alcohol swab and utilizing lateral approach with ultrasound guidance, patient's right subacromial space was injected with 3:1 bupivicaine: depomedrol. Patient tolerated the procedure well without immediate complications.

## 2019-06-15 NOTE — Assessment & Plan Note (Signed)
Chronic Clinically she appears euthyroid She has missed some of her medication without realizing it She will be more regimented about taking on a daily basis No change in dose at this time Recheck TSH in about 3 months

## 2019-06-15 NOTE — Assessment & Plan Note (Signed)
Chronic A1c 8.2% Elevated sugars related to noncompliant diabetic diet, not exercising regularly and some weight gain She is already started to make lifestyle changes and will try to get it controlled with it lifestyle as much as possible, but is open to medication Start Farxiga 5 mg daily.  Discussed possible side effects She would benefit from checking her sugars more regularly to get better control due to hypoglycemia-recommend to check 4 times a day Freestyle libre prescription sent to pharmacy Recheck A1c in about 3 months

## 2019-06-17 DIAGNOSIS — R278 Other lack of coordination: Secondary | ICD-10-CM | POA: Diagnosis not present

## 2019-06-17 DIAGNOSIS — M25611 Stiffness of right shoulder, not elsewhere classified: Secondary | ICD-10-CM | POA: Diagnosis not present

## 2019-06-17 DIAGNOSIS — M25511 Pain in right shoulder: Secondary | ICD-10-CM | POA: Diagnosis not present

## 2019-06-17 DIAGNOSIS — M6281 Muscle weakness (generalized): Secondary | ICD-10-CM | POA: Diagnosis not present

## 2019-06-21 ENCOUNTER — Ambulatory Visit: Payer: Self-pay

## 2019-06-21 ENCOUNTER — Ambulatory Visit: Payer: 59 | Attending: Internal Medicine

## 2019-06-21 DIAGNOSIS — Z23 Encounter for immunization: Secondary | ICD-10-CM | POA: Insufficient documentation

## 2019-06-21 NOTE — Progress Notes (Signed)
   Covid-19 Vaccination Clinic  Name:  Cheryl Chan    MRN: [id number] DOB: 02-Feb-1956  06/21/2019  Cheryl Chan was observed post Covid-19 immunization for 15 minutes without incidence. She was provided with Vaccine Information Sheet and instruction to access the V-Safe system.   Cheryl Chan was instructed to call 911 with any severe reactions post vaccine: Marland Kitchen Difficulty breathing  . Swelling of your face and throat  . A fast heartbeat  . A bad rash all over your body  . Dizziness and weakness    Immunizations Administered    Name Date Dose VIS Date Route   Pfizer COVID-19 Vaccine 06/21/2019  9:35 AM 0.3 mL 05/01/2019 Intramuscular   Manufacturer: [place]   Lot: [id number]   [place]: [id number]

## 2019-06-26 DIAGNOSIS — M25511 Pain in right shoulder: Secondary | ICD-10-CM | POA: Diagnosis not present

## 2019-06-26 DIAGNOSIS — M25611 Stiffness of right shoulder, not elsewhere classified: Secondary | ICD-10-CM | POA: Diagnosis not present

## 2019-06-26 DIAGNOSIS — M6281 Muscle weakness (generalized): Secondary | ICD-10-CM | POA: Diagnosis not present

## 2019-06-26 DIAGNOSIS — R278 Other lack of coordination: Secondary | ICD-10-CM | POA: Diagnosis not present

## 2019-06-28 ENCOUNTER — Ambulatory Visit: Payer: 59

## 2019-06-30 ENCOUNTER — Ambulatory Visit: Payer: 59 | Admitting: Internal Medicine

## 2019-06-30 ENCOUNTER — Telehealth: Payer: Self-pay

## 2019-06-30 DIAGNOSIS — M25611 Stiffness of right shoulder, not elsewhere classified: Secondary | ICD-10-CM | POA: Diagnosis not present

## 2019-06-30 DIAGNOSIS — M25511 Pain in right shoulder: Secondary | ICD-10-CM | POA: Diagnosis not present

## 2019-06-30 DIAGNOSIS — R278 Other lack of coordination: Secondary | ICD-10-CM | POA: Diagnosis not present

## 2019-06-30 DIAGNOSIS — M6281 Muscle weakness (generalized): Secondary | ICD-10-CM | POA: Diagnosis not present

## 2019-06-30 NOTE — Telephone Encounter (Signed)
New message    [facility] pharmacy calling need prior authorization  Continuous Blood Gluc Receiver (FREESTYLE LIBRE READER) DEVI Continuous Blood Gluc Sensor (FREESTYLE LIBRE 14 DAY SENSOR) MISC

## 2019-07-01 NOTE — Telephone Encounter (Signed)
PA initiated-[id number]  Waiting on response.

## 2019-07-03 DIAGNOSIS — R278 Other lack of coordination: Secondary | ICD-10-CM | POA: Diagnosis not present

## 2019-07-03 DIAGNOSIS — M6281 Muscle weakness (generalized): Secondary | ICD-10-CM | POA: Diagnosis not present

## 2019-07-03 DIAGNOSIS — M25511 Pain in right shoulder: Secondary | ICD-10-CM | POA: Diagnosis not present

## 2019-07-03 DIAGNOSIS — M25611 Stiffness of right shoulder, not elsewhere classified: Secondary | ICD-10-CM | POA: Diagnosis not present

## 2019-07-07 NOTE — Telephone Encounter (Signed)
PA was denied. Sent my chart message letting pt know.

## 2019-07-16 ENCOUNTER — Ambulatory Visit: Payer: 59

## 2019-07-16 MED FILL — FARXIGA 5 MG TABLET: 5 | 30 days supply | Qty: 30 | Fill #1

## 2019-07-27 ENCOUNTER — Ambulatory Visit: Payer: 59 | Admitting: Family Medicine

## 2019-07-27 ENCOUNTER — Other Ambulatory Visit: Payer: Self-pay | Admitting: Internal Medicine

## 2019-07-27 MED FILL — LEVOTHYROXINE 88 MCG TABLET: 88 | 90 days supply | Qty: 90 | Fill #0

## 2019-07-31 ENCOUNTER — Ambulatory Visit (INDEPENDENT_AMBULATORY_CARE_PROVIDER_SITE_OTHER): Payer: 59 | Admitting: Internal Medicine

## 2019-07-31 ENCOUNTER — Encounter: Payer: Self-pay | Admitting: Internal Medicine

## 2019-07-31 ENCOUNTER — Other Ambulatory Visit: Payer: Self-pay

## 2019-07-31 VITALS — BP 130/80 | Temp 97.7°F | Ht 62.0 in | Wt 159.0 lb

## 2019-07-31 DIAGNOSIS — E119 Type 2 diabetes mellitus without complications: Secondary | ICD-10-CM

## 2019-07-31 DIAGNOSIS — Z8249 Family history of ischemic heart disease and other diseases of the circulatory system: Secondary | ICD-10-CM | POA: Diagnosis not present

## 2019-07-31 DIAGNOSIS — E7801 Familial hypercholesterolemia: Secondary | ICD-10-CM | POA: Diagnosis not present

## 2019-07-31 DIAGNOSIS — E663 Overweight: Secondary | ICD-10-CM | POA: Diagnosis not present

## 2019-07-31 NOTE — Patient Instructions (Signed)
Medication Instructions:  Dr. Debara Pickett recommends Repatha 140mg /mL (PCSK9). This is an injectable cholesterol medication self-administered once every 14 days. This medication will need prior approval with your insurance company, which we will work on. If the medication is not approved initially, we may need to do an appeal with your insurance. We will keep you updated on this process.   Administer medication in area of fatty tissue such as abdomen, outer thigh, back up of arm - and rotate site with each injection Store medication in refrigerator until ready to administer - allow to sit at room temp for 30 mins - 1 hour prior to injection Dispose of medication is a Radiation protection practitioner - your pharmacy should be able to direct you on this and proper disposal   If you need co-pay assistance, please look into the program at healthwellfoundation.org >> disease funds >> hypercholesterolemia. This is an online application or you can call to complete.   If you need a co-pay card for Repatha: [email or web address]-[email or web address] >> paying for Repatha If you need a co-pay card for Praluent: WedMap.it >> starting & paying for Praluent  *If you need a refill on your cardiac medications before your next appointment, please call your pharmacy*   Lab Work: FASTING lab work in 3-4 months to check cholesterol   If you have labs (blood work) drawn today and your tests are completely normal, you will receive your results only by: Marland Kitchen MyChart Message (if you have MyChart) OR . A paper copy in the mail If you have any lab test that is abnormal or we need to change your treatment, we will call you to review the results.   Testing/Procedures: NONE   Follow-Up: At [facility], you and your health needs are our priority.  As part of our continuing mission to provide you with exceptional heart care, we have created designated Provider Care Teams.  These Care Teams include your primary Cardiologist (physician) and Advanced Practice Providers  (APPs -  Physician Assistants and Nurse Practitioners) who all work together to provide you with the care you need, when you need it.  We recommend signing up for the patient portal called "MyChart".  Sign up information is provided on this After Visit Summary.  MyChart is used to connect with patients for Virtual Visits (Telemedicine).  Patients are able to view lab/test results, encounter notes, upcoming appointments, etc.  Non-urgent messages can be sent to your provider as well.   To learn more about what you can do with MyChart, go to [email or web address].    Your next appointment:   3-4 months - lipid clinic  The format for your next appointment:   Either In Person or Virtual  Provider:   K. Mali Hilty, MD   Other Instructions

## 2019-08-01 ENCOUNTER — Encounter: Payer: Self-pay | Admitting: Internal Medicine

## 2019-08-01 NOTE — Progress Notes (Signed)
LIPID CLINIC CONSULT NOTE  Chief Complaint:  Manage dyslipidemia  Primary Care Physician: Binnie Rail, MD  Primary Cardiologist:  No primary care provider on file.  HPI:  Cheryl Chan is a 64 y.o. female who is being seen today for the evaluation of dyslipidemia at the request of Burns, Claudina Lick, MD.  This is a pleasant 64 year old female who is referred for evaluation management of dyslipidemia.  She reports a longstanding history of high cholesterol and has been statin intolerant for a number of years.  Despite significant exercise and dietary changes in the past she is not been able to get adequate control of her cholesterol.  She also reports that her mother had high cholesterol and that there is heart disease in her family.  Her brother was diagnosed in 2006 with a nonischemic cardiomyopathy ultimately died.  She underwent familial genetic testing with Dr. Broadus John and was referred to Dr. Ronna Polio in our heart failure clinic to see if she possibly could be at risk for ARVC.  Ultimately she had a cardiac MRI in February 2020 which showed normal biventricular function.  He had a stress echo in 2011 at the direction of Dr. Bea Laura which was negative for ischemia.  Her husband is a Software engineer at the [employer].  She had several questions regarding her elevated cholesterol.  Incidentally her most recent lipid showed total cholesterol 285, triglycerides 220, HDL 55 and direct LDL 195.  Her LDL has been well over 200 in the past, suggestive of a familial hyperlipidemia.  Surprisingly, she has not had prior cardiovascular events that were aware of.  She currently denies any coronary symptoms.  PMHx:  Past Medical History:  Diagnosis Date  . Anxiety   . Arthritis   . Complex endometrial hyperplasia 2005  . 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 Diet-controlled type 2 diabetes mellitus ([place])   . Endometrial cancer ([place]) 10/2011 dx   s/p vag hyst  11/2011  . History of chicken pox   . Hyperlipidemia   . Hypothyroidism   . 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 Rotator cuff injury   . Shingles     Past Surgical History:  Procedure Laterality Date  . ABDOMINAL HYSTERECTOMY    . CESAREAN SECTION     x's 4  . DILATION AND CURETTAGE OF UTERUS     hysteroscopy  . FRACTURE SURGERY     nose as a child  . OPEN REDUCTION INTERNAL FIXATION (ORIF) DISTAL PHALANX Left 08/05/2012   Procedure: OPEN REDUCTION INTERNAL FIXATION (ORIF) DISTAL PHALANX VERSUS EXTERNAL FIXATION LEFT RING FINGER;  Surgeon: Tennis Must, MD;  Location: [facility];  Service: Orthopedics;  Laterality: Left;  . ROBOTIC ASSISTED LAP VAGINAL HYSTERECTOMY  12/04/2011   Procedure: ROBOTIC ASSISTED LAPAROSCOPIC VAGINAL HYSTERECTOMY;  Surgeon: Imagene Gurney A. Alycia Rossetti, MD;  Location: [facility];  Service: Gynecology;  Laterality: N/A;  . SHOULDER ARTHROSCOPY WITH ROTATOR CUFF REPAIR AND SUBACROMIAL DECOMPRESSION Left 11/27/2013   Procedure: LEFT SHOULDER ARTHROSCOPY, SUBACROMIAL DECOMPRESSION, PARTIAL ACROMIOPLASTY WITH  CORACOACROMIAL RELEASE, DISTAL CLAVICULECTOMY WITH ROTATOR CUFF REPAIR AND EXTENSIVE DEBRIDEMENT;  Surgeon: Lorn Junes, MD;  Location: [facility];  Service: Orthopedics;  Laterality: Left;    FAMHx:  Family History  Problem Relation Age of Onset  . Arthritis Mother   . Cancer Father 56       prostrate  . Hypertension Father   . Stroke  Father        in setting of sepsis f/ RMSF  . Heart disease Brother 63       cardiomyopathy s/p ICD, on transplant list  . Heart failure Brother   . Heart failure Other     SOCHx:   reports that she has never smoked. She has never used smokeless tobacco. She reports previous alcohol use. She reports that she does not use drugs.  ALLERGIES:  Allergies  Allergen Reactions  . Crestor [Rosuvastatin Calcium]     Muscle ache  . Demerol [Meperidine]   . Statins   . Trulicity [Dulaglutide]     Nausea,  diarrhea  . Victoza [Liraglutide]     Nausea, diarrhea  . Metformin And Related     diarrhea    ROS: Pertinent items noted in HPI and remainder of comprehensive ROS otherwise negative.  HOME MEDS: Current Outpatient Medications on File Prior to Visit  Medication Sig Dispense Refill  . dapagliflozin propanediol (FARXIGA) 5 MG TABS tablet Take 5 mg by mouth daily before breakfast. 30 tablet 5  . fluticasone (FLONASE) 50 MCG/ACT nasal spray PLACE 2 SPRAYS INTO BOTH NOSTRILS DAILY. 48 g 2  . glucose blood (ACCU-CHEK GUIDE) test strip Test three times a day as needed 100 each 12  . Lancets (FREESTYLE) lancets Use as instructed 100 each 12  . levothyroxine (SYNTHROID) 88 MCG tablet TAKE 1 TABLET BY MOUTH DAILY BEFORE BREAKFAST. 90 tablet 1  . nitroGLYCERIN (NITRODUR - DOSED IN MG/24 HR) 0.2 mg/hr patch Apply 1/4th patch to affected area, change daily 30 patch 1   No current facility-administered medications on file prior to visit.    LABS/IMAGING: No results found for this or any previous visit (from the past 48 hour(s)). No results found.  LIPID PANEL:    Component Value Date/Time   CHOL 285 (H) 06/12/2019 0849   TRIG 220.0 (H) 06/12/2019 0849   HDL 55.70 06/12/2019 0849   CHOLHDL 5 06/12/2019 0849   VLDL 44.0 (H) 06/12/2019 0849   LDLCALC 177 (H) 12/17/2018 1337   LDLDIRECT 195.0 06/12/2019 0849    WEIGHTS: Wt Readings from Last 3 Encounters:  07/31/19 159 lb (72.1 kg)  06/15/19 159 lb (72.1 kg)  05/05/19 161 lb (73 kg)    VITALS: BP 130/80   Temp 97.7 F (36.5 C)   Ht 5\' 2"  (1.575 m)   Wt 159 lb (72.1 kg)   LMP 05/22/2003   SpO2 97%   BMI 29.08 kg/m   EXAM: Deferred  EKG: Normal sinus rhythm at 76, possible left atrial enlargement- personally reviewed  ASSESSMENT: 1. Possible familial hyperlipidemia- Baruch Merl Criteria 2. Statin intolerance - myalgias 3. Type 2 diabetes.  PLAN: 1.   Ms. [name] has a probable familial hyperlipidemia by Baruch Merl  criteria.  Unfortunately she had statin intolerance in the past causing myalgias and has previously tried atorvastatin, rosuvastatin and other high and moderate potency statins.  She is a good candidate for PCSK9 inhibitor.  She had some concerns that she might possibly have an FH variant that was not associated with early onset heart disease.  I reassured her that the current guidelines recommend despite any genetic testing, with LDL cholesterol persistently elevated and her family history of heart disease, treatment to lower LDL cholesterol greater than 50% is guideline recommended.  Addition, she has type 2 diabetes which is a further cardiovascular risk factor.  After some discussion, she seemed agreeable to try a PCSK9 inhibitor.  We will  proceed with prior authorization and plan a repeat lipid in about 3 to 4 months and follow-up with me at that time.  Thanks again for the kind referral.  Pixie Casino, MD, FACC, [place] Director of the Advanced Lipid Disorders &  Cardiovascular Risk Reduction Clinic Diplomate of the American Board of Clinical Lipidology Attending Cardiologist  Direct Dial: [phone]  Fax: [phone]  Website:  [email or web address] 08/01/2019, 8:08 PM

## 2019-08-04 ENCOUNTER — Telehealth: Payer: Self-pay | Admitting: Internal Medicine

## 2019-08-04 NOTE — Telephone Encounter (Signed)
PA for Repatha [employer] submitted via CMM (Key: [id number])

## 2019-08-05 ENCOUNTER — Other Ambulatory Visit: Payer: Self-pay | Admitting: Internal Medicine

## 2019-08-05 MED ORDER — REPATHA SURECLICK 140 MG/ML ~~LOC~~ SOAJ: 1.0000 | SUBCUTANEOUS | 11 refills | Status: DC

## 2019-08-05 MED FILL — REPATHA SURECLICK 140 MG/ML: 140 | 28 days supply | Qty: 2 | Fill #0

## 2019-08-05 NOTE — Telephone Encounter (Signed)
Patient notified via My Chart

## 2019-08-05 NOTE — Addendum Note (Signed)
Addended by: Fidel Levy on: 08/05/2019 01:46 PM   Modules accepted: Orders

## 2019-08-05 NOTE — Telephone Encounter (Signed)
The request has been approved. The authorization is effective for a maximum of 12 fills from 08/05/2019 to 08/03/2020, as long as the member is enrolled in their current health plan. The request was approved as submitted. A written notification letter will follow with additional details.

## 2019-08-07 ENCOUNTER — Other Ambulatory Visit: Payer: Self-pay

## 2019-08-07 ENCOUNTER — Ambulatory Visit
Admission: RE | Admit: 2019-08-07 | Discharge: 2019-08-07 | Disposition: A | Discharge status: Home / Self Care | Payer: 59 | Source: Ambulatory Visit | Attending: Certified Nurse Midwife | Admitting: Certified Nurse Midwife

## 2019-08-07 DIAGNOSIS — Z1231 Encounter for screening mammogram for malignant neoplasm of breast: Secondary | ICD-10-CM

## 2019-08-10 NOTE — Progress Notes (Signed)
Mammogram is negative. Density B  Repeat yearly

## 2019-08-11 ENCOUNTER — Encounter: Payer: Self-pay | Admitting: Certified Nurse Midwife

## 2019-08-13 ENCOUNTER — Other Ambulatory Visit (INDEPENDENT_AMBULATORY_CARE_PROVIDER_SITE_OTHER): Payer: 59

## 2019-08-13 DIAGNOSIS — H5213 Myopia, bilateral: Secondary | ICD-10-CM | POA: Diagnosis not present

## 2019-08-13 DIAGNOSIS — E119 Type 2 diabetes mellitus without complications: Secondary | ICD-10-CM | POA: Diagnosis not present

## 2019-08-13 DIAGNOSIS — Z8249 Family history of ischemic heart disease and other diseases of the circulatory system: Secondary | ICD-10-CM

## 2019-08-13 DIAGNOSIS — H524 Presbyopia: Secondary | ICD-10-CM | POA: Diagnosis not present

## 2019-08-13 DIAGNOSIS — H25813 Combined forms of age-related cataract, bilateral: Secondary | ICD-10-CM | POA: Diagnosis not present

## 2019-08-13 DIAGNOSIS — H52223 Regular astigmatism, bilateral: Secondary | ICD-10-CM | POA: Diagnosis not present

## 2019-08-14 MED FILL — FARXIGA 5 MG TABLET: 5 | 30 days supply | Qty: 30 | Fill #2

## 2019-08-28 ENCOUNTER — Other Ambulatory Visit: Payer: Self-pay | Admitting: Internal Medicine

## 2019-08-28 MED FILL — FLUTICASONE PROP 50 MCG SPR: 50 | 90 days supply | Qty: 48 | Fill #0

## 2019-08-28 MED FILL — REPATHA SURECLICK 140 MG/ML: 140 | 28 days supply | Qty: 2 | Fill #1

## 2019-09-11 MED FILL — FARXIGA 5 MG TABLET: 5 | 30 days supply | Qty: 30 | Fill #3

## 2019-09-17 ENCOUNTER — Encounter: Payer: Self-pay | Admitting: Internal Medicine

## 2019-09-17 DIAGNOSIS — E119 Type 2 diabetes mellitus without complications: Secondary | ICD-10-CM

## 2019-09-17 DIAGNOSIS — E039 Hypothyroidism, unspecified: Secondary | ICD-10-CM

## 2019-09-21 ENCOUNTER — Encounter: Payer: Self-pay | Admitting: Internal Medicine

## 2019-09-21 ENCOUNTER — Other Ambulatory Visit (INDEPENDENT_AMBULATORY_CARE_PROVIDER_SITE_OTHER): Payer: 59

## 2019-09-21 DIAGNOSIS — E039 Hypothyroidism, unspecified: Secondary | ICD-10-CM | POA: Diagnosis not present

## 2019-09-21 DIAGNOSIS — E119 Type 2 diabetes mellitus without complications: Secondary | ICD-10-CM | POA: Diagnosis not present

## 2019-09-21 DIAGNOSIS — L218 Other seborrheic dermatitis: Secondary | ICD-10-CM | POA: Diagnosis not present

## 2019-09-21 LAB — HEMOGLOBIN A1C: Hgb A1c MFr Bld: 7.6 % — ABNORMAL HIGH (ref 4.6–6.5)

## 2019-09-21 LAB — TSH: TSH: 1.83 u[IU]/mL (ref 0.35–4.50)

## 2019-09-21 MED FILL — FLUOCINONIDE 0.05 % SOLN: 0.05 | 30 days supply | Qty: 60 | Fill #0

## 2019-09-22 MED ORDER — FARXIGA 10 MG PO TABS
10.0000 mg | ORAL_TABLET | Freq: Every day | ORAL | 1 refills | Status: DC
Start: 2019-09-22 — End: 2020-04-26

## 2019-09-22 MED FILL — FARXIGA 10 MG TABLET: 10 | 90 days supply | Qty: 90 | Fill #0

## 2019-10-05 MED FILL — REPATHA SURECLICK 140 MG/ML: 140 | 28 days supply | Qty: 2 | Fill #2

## 2019-10-20 MED FILL — LEVOTHYROXINE 88 MCG TABLET: 88 | 90 days supply | Qty: 90 | Fill #1

## 2019-10-31 DIAGNOSIS — Z20822 Contact with and (suspected) exposure to covid-19: Secondary | ICD-10-CM | POA: Diagnosis not present

## 2019-10-31 DIAGNOSIS — Z03818 Encounter for observation for suspected exposure to other biological agents ruled out: Secondary | ICD-10-CM | POA: Diagnosis not present

## 2019-11-02 MED FILL — REPATHA SURECLICK 140 MG/ML: 140 | 28 days supply | Qty: 2 | Fill #3

## 2019-11-24 DIAGNOSIS — Z8249 Family history of ischemic heart disease and other diseases of the circulatory system: Secondary | ICD-10-CM | POA: Diagnosis not present

## 2019-11-24 DIAGNOSIS — E7849 Other hyperlipidemia: Secondary | ICD-10-CM | POA: Diagnosis not present

## 2019-11-24 LAB — LIPID PANEL
Chol/HDL Ratio: 2.8 ratio (ref 0.0–4.4)
Cholesterol, Total: 170 mg/dL (ref 100–199)
HDL: 61 mg/dL (ref >39)
LDL Chol Calc (NIH): 87 mg/dL (ref 0–99)
Triglycerides: 126 mg/dL (ref 0–149)
VLDL Cholesterol Cal: 22 mg/dL (ref 5–40)

## 2019-11-26 ENCOUNTER — Telehealth: Payer: Self-pay | Admitting: Obstetrics & Gynecology

## 2019-11-26 MED FILL — REPATHA SURECLICK 140 MG/ML: 140 | 28 days supply | Qty: 2 | Fill #4

## 2019-11-26 NOTE — Telephone Encounter (Signed)
Spoke back with pt. Pt agreeable to OV on 7/9. Pt scheduled with Dr Sabra Heck on 7/9 at 1200 noon. Pt verbalized understanding of date and time of appt. CPS neg.   Routing to Dr Sabra Heck. Encounter closed.

## 2019-11-26 NOTE — Telephone Encounter (Signed)
Patient noticed her left nipple is inverted.

## 2019-11-26 NOTE — Progress Notes (Deleted)
GYNECOLOGY  VISIT  CC:   ***  HPI: 64 y.o. [id number] Married White or Caucasian female here for inverted nipple.  GYNECOLOGIC HISTORY: Patient's last menstrual period was 05/22/2003. Contraception: *** Menopausal hormone therapy: ***  Patient Active Problem List   Diagnosis Date Noted  . Thyroid nodule 12/24/2018  . Osteoarthritis 04/22/2017  . Onychomycosis of toenail 09/25/2016  . Overweight 01/25/2015  . Rotator cuff injury   . Uterine cancer ([place]) 11/12/2011  . Non-insulin dependent type 2 diabetes mellitus ([place])   . Hypothyroidism   . Hyperlipidemia   . Depression     Past Medical History:  Diagnosis Date  . Anxiety   . Arthritis   . Complex endometrial hyperplasia 2005  . 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 Diet-controlled type 2 diabetes mellitus ([place])   . Endometrial cancer ([place]) 10/2011 dx   s/p vag hyst 11/2011  . History of chicken pox   . Hyperlipidemia   . Hypothyroidism   . 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 Rotator cuff injury   . Shingles     Past Surgical History:  Procedure Laterality Date  . ABDOMINAL HYSTERECTOMY    . CESAREAN SECTION     x's 4  . DILATION AND CURETTAGE OF UTERUS     hysteroscopy  . FRACTURE SURGERY     nose as a child  . OPEN REDUCTION INTERNAL FIXATION (ORIF) DISTAL PHALANX Left 08/05/2012   Procedure: OPEN REDUCTION INTERNAL FIXATION (ORIF) DISTAL PHALANX VERSUS EXTERNAL FIXATION LEFT RING FINGER;  Surgeon: Tennis Must, MD;  Location: [facility];  Service: Orthopedics;  Laterality: Left;  . ROBOTIC ASSISTED LAP VAGINAL HYSTERECTOMY  12/04/2011   Procedure: ROBOTIC ASSISTED LAPAROSCOPIC VAGINAL HYSTERECTOMY;  Surgeon: Imagene Gurney A. Alycia Rossetti, MD;  [facility];  Service: Gynecology;  Laterality: N/A;  . SHOULDER ARTHROSCOPY WITH ROTATOR CUFF REPAIR AND SUBACROMIAL DECOMPRESSION Left 11/27/2013   Procedure: LEFT SHOULDER ARTHROSCOPY, SUBACROMIAL DECOMPRESSION, PARTIAL ACROMIOPLASTY WITH   CORACOACROMIAL RELEASE, DISTAL CLAVICULECTOMY WITH ROTATOR CUFF REPAIR AND EXTENSIVE DEBRIDEMENT;  Surgeon: Lorn Junes, MD;  Location: [facility];  Service: Orthopedics;  Laterality: Left;    MEDS:   Current Outpatient Medications on File Prior to Visit  Medication Sig Dispense Refill  . dapagliflozin propanediol (FARXIGA) 10 MG TABS tablet Take 10 mg by mouth daily before breakfast. 90 tablet 1  . Evolocumab (REPATHA SURECLICK) 245 MG/ML SOAJ Inject 1 Dose into the skin every 14 (fourteen) days. 2 pen 11  . fluticasone (FLONASE) 50 MCG/ACT nasal spray PLACE 2 SPRAYS INTO BOTH NOSTRILS DAILY. 48 g 2  . glucose blood (ACCU-CHEK GUIDE) test strip Test three times a day as needed 100 each 12  . Lancets (FREESTYLE) lancets Use as instructed 100 each 12  . levothyroxine (SYNTHROID) 88 MCG tablet TAKE 1 TABLET BY MOUTH DAILY BEFORE BREAKFAST. 90 tablet 1  . nitroGLYCERIN (NITRODUR - DOSED IN MG/24 HR) 0.2 mg/hr patch Apply 1/4th patch to affected area, change daily 30 patch 1   No current facility-administered medications on file prior to visit.    ALLERGIES: Crestor [rosuvastatin calcium], Demerol [meperidine], Statins, Trulicity [dulaglutide], Victoza [liraglutide], and Metformin and related  Family History  Problem Relation Age of Onset  . Arthritis Mother   . Cancer Father 46       prostrate  . Hypertension Father   . Stroke Father        in setting of sepsis f/ RMSF  .  Heart disease Brother 83       cardiomyopathy s/p ICD, on transplant list  . Heart failure Brother   . Heart failure Other     SH:  ***  Review of Systems  PHYSICAL EXAMINATION:    LMP 05/22/2003     General appearance: alert, cooperative and appears stated age Neck: no adenopathy, supple, symmetrical, trachea midline and thyroid {CHL AMB PHY EX THYROID NORM DEFAULT:[phone]::"normal to inspection and palpation"} CV:  {Exam; heart brief:31539} Lungs:  {pe lungs ob:314451::"clear to  auscultation, no wheezes, rales or rhonchi, symmetric air entry"} Breasts: {Exam; breast:13139::"normal appearance, no masses or tenderness"} Abdomen: soft, non-tender; bowel sounds normal; no masses,  no organomegaly Lymph:  no inguinal LAD noted  Pelvic: External genitalia:  no lesions              Urethra:  normal appearing urethra with no masses, tenderness or lesions              Bartholins and Skenes: normal                 Vagina: normal appearing vagina with normal color and discharge, no lesions              Cervix: {CHL AMB PHY EX CERVIX NORM DEFAULT:[phone]::"no lesions"}              Bimanual Exam:  Uterus:  {CHL AMB PHY EX UTERUS NORM DEFAULT:[phone]::"normal size, contour, position, consistency, mobility, non-tender"}              Adnexa: {CHL AMB PHY EX ADNEXA NO MASS DEFAULT:[phone]::"no mass, fullness, tenderness"}              Rectovaginal: {yes no:314532}.  Confirms.              Anus:  normal sphincter tone, no lesions  Chaperone, ***Terence Lux, CMA, was present for exam.  Assessment: ***  Plan: ***   ~{NUMBERS; -10-45 JOINT ROM:10287} minutes spent with patient >50% of time was in face to face discussion of above.

## 2019-11-26 NOTE — Telephone Encounter (Signed)
Please put pt on schedule for tomorrow if she can come.  Thanks.

## 2019-11-26 NOTE — Telephone Encounter (Signed)
AEX 04/2019 with DL H/O uterine cancer, hysterectomy 2013.  Spoke with pt. Pt states noticing inverted nipple on left breast last night. Pt states did get to evert and is everted this morning. Pt denies any pain, fever, redness, breast skin changes, or dimpling or any nipple discharge. Pt states has not noticed in the past. Reviewed AEX notes, inverted nipple not noted.  Pt requesting to be seen for peace of mind. Pt advised will review with Dr Sabra Heck for [place] and return call. Pt agreeable.   Routing to Dr Sabra Heck.

## 2019-11-27 ENCOUNTER — Ambulatory Visit: Payer: Self-pay | Admitting: Obstetrics & Gynecology

## 2019-11-27 ENCOUNTER — Other Ambulatory Visit: Payer: Self-pay

## 2019-11-27 ENCOUNTER — Other Ambulatory Visit: Payer: Self-pay | Admitting: Obstetrics & Gynecology

## 2019-11-27 ENCOUNTER — Encounter: Payer: Self-pay | Admitting: Obstetrics & Gynecology

## 2019-11-27 ENCOUNTER — Telehealth: Payer: Self-pay | Admitting: Obstetrics & Gynecology

## 2019-11-27 ENCOUNTER — Ambulatory Visit: Payer: 59 | Admitting: Obstetrics & Gynecology

## 2019-11-27 VITALS — BP 112/70 | HR 68 | Resp 16 | Wt 150.0 lb

## 2019-11-27 DIAGNOSIS — N6459 Other signs and symptoms in breast: Secondary | ICD-10-CM

## 2019-11-27 NOTE — Telephone Encounter (Signed)
Spoke with Cheryl Chan. Cheryl Chan given appt at [facility]. Cheryl Chan agreeable and verbalized understanding.  Routing to Dr Sabra Heck for review  Encounter closed.  Cc: Sharee Pimple for MMG hold

## 2019-11-27 NOTE — Telephone Encounter (Signed)
Patient was in to see Dr Sabra Heck and calling to check if order was fax to the breast center.

## 2019-11-27 NOTE — Telephone Encounter (Signed)
Cheryl Salon, MD  Georgia Lopes, RN [name]  This pt has new left nipple inversion. She needs a diagnostic left MMG with ultrasound. She will go as soon as they have an appt for her. Thanks.   Vinnie Level    Call placed to [facility] to schedule. Spoke with Ena Dawley. Pt scheduled for 12/02/19 at 2:30pm.  Orders placed by [facility] for co-sign.

## 2019-11-27 NOTE — Progress Notes (Signed)
GYNECOLOGY  VISIT  CC:   Inverted nipple  HPI: 64 y.o. [id number] Married White or Caucasian female here for inverted nipple that she noted earlier this week.  The nipple was fully inverted.  She could get it to evert but it would invert again immediately.  She's never had anything like this.  There was no pain.  She had no palpable breast mass/lump.  Denies nipple discharge.  No recent breast trauma.  MMG 07/2019 and this was normal.  Pt reports nipple is not not as inverted but it still is some.  GYNECOLOGIC HISTORY: Patient's last menstrual period was 05/22/2003. Contraception: hysterectomy Menopausal hormone therapy: none  Patient Active Problem List   Diagnosis Date Noted  . Thyroid nodule 12/24/2018  . Osteoarthritis 04/22/2017  . Onychomycosis of toenail 09/25/2016  . Overweight 01/25/2015  . Rotator cuff injury   . Uterine cancer ([place]) 11/12/2011  . Non-insulin dependent type 2 diabetes mellitus ([place])   . Hypothyroidism   . Hyperlipidemia   . Depression     Past Medical History:  Diagnosis Date  . Anxiety   . Arthritis   . Complex endometrial hyperplasia 2005  . 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 Diet-controlled type 2 diabetes mellitus ([place])   . Endometrial cancer ([place]) 10/2011 dx   s/p vag hyst 11/2011  . History of chicken pox   . Hyperlipidemia   . Hypothyroidism   . 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 Rotator cuff injury   . Shingles     Past Surgical History:  Procedure Laterality Date  . ABDOMINAL HYSTERECTOMY    . CESAREAN SECTION     x's 4  . DILATION AND CURETTAGE OF UTERUS     hysteroscopy  . FRACTURE SURGERY     nose as a child  . OPEN REDUCTION INTERNAL FIXATION (ORIF) DISTAL PHALANX Left 08/05/2012   Procedure: OPEN REDUCTION INTERNAL FIXATION (ORIF) DISTAL PHALANX VERSUS EXTERNAL FIXATION LEFT RING FINGER;  Surgeon: Tennis Must, MD;  Location: [facility];  Service: Orthopedics;  Laterality: Left;  . ROBOTIC  ASSISTED LAP VAGINAL HYSTERECTOMY  12/04/2011   Procedure: ROBOTIC ASSISTED LAPAROSCOPIC VAGINAL HYSTERECTOMY;  Surgeon: Imagene Gurney A. Alycia Rossetti, MD;  [facility];  Service: Gynecology;  Laterality: N/A;  . SHOULDER ARTHROSCOPY WITH ROTATOR CUFF REPAIR AND SUBACROMIAL DECOMPRESSION Left 11/27/2013   Procedure: LEFT SHOULDER ARTHROSCOPY, SUBACROMIAL DECOMPRESSION, PARTIAL ACROMIOPLASTY WITH  CORACOACROMIAL RELEASE, DISTAL CLAVICULECTOMY WITH ROTATOR CUFF REPAIR AND EXTENSIVE DEBRIDEMENT;  Surgeon: Lorn Junes, MD;  Location: [facility];  Service: Orthopedics;  Laterality: Left;    MEDS:   Current Outpatient Medications on File Prior to Visit  Medication Sig Dispense Refill  . dapagliflozin propanediol (FARXIGA) 10 MG TABS tablet Take 10 mg by mouth daily before breakfast. 90 tablet 1  . Evolocumab (REPATHA SURECLICK) 712 MG/ML SOAJ Inject 1 Dose into the skin every 14 (fourteen) days. 2 pen 11  . fluticasone (FLONASE) 50 MCG/ACT nasal spray PLACE 2 SPRAYS INTO BOTH NOSTRILS DAILY. 48 g 2  . glucose blood (ACCU-CHEK GUIDE) test strip Test three times a day as needed 100 each 12  . Lancets (FREESTYLE) lancets Use as instructed 100 each 12  . levothyroxine (SYNTHROID) 88 MCG tablet TAKE 1 TABLET BY MOUTH DAILY BEFORE BREAKFAST. 90 tablet 1   No current facility-administered medications on file prior to visit.    ALLERGIES: Crestor [rosuvastatin calcium], Demerol [meperidine], Statins, Trulicity [dulaglutide], Victoza [liraglutide], and Metformin  and related  Family History  Problem Relation Age of Onset  . Arthritis Mother   . Cancer Father 27       prostrate  . Hypertension Father   . Stroke Father        in setting of sepsis f/ RMSF  . Heart disease Brother 72       cardiomyopathy s/p ICD, on transplant list  . Heart failure Brother   . Heart failure Other     SH:  Married, non smoker  Review of Systems  Constitutional: Negative.   HENT: Negative.   Eyes: Negative.    Respiratory: Negative.   Cardiovascular: Negative.   Gastrointestinal: Negative.   Endocrine: Negative.   Genitourinary: Negative.   Musculoskeletal: Negative.   Skin:       Left nipple inversion  Allergic/Immunologic: Negative.   Neurological: Negative.   Hematological: Negative.   Psychiatric/Behavioral: Negative.     PHYSICAL EXAMINATION:    BP 112/70   Pulse 68   Resp 16   Wt 150 lb (68 kg)   LMP 05/22/2003   BMI 27.44 kg/m     Physical Exam Constitutional:      Appearance: Normal appearance.  Chest:     Breasts:        Right: No swelling, bleeding, inverted nipple, mass, nipple discharge, skin change or tenderness.        Left: Inverted nipple present. No swelling, bleeding, mass, nipple discharge, skin change or tenderness.    Musculoskeletal:     Cervical back: Normal range of motion. No tenderness.  Lymphadenopathy:     Upper Body:     Right upper body: No supraclavicular or axillary adenopathy.     Left upper body: No supraclavicular or axillary adenopathy.  Neurological:     Mental Status: She is alert.    Assessment: Left inverted nipple  Plan: Plan diagnostic MMG.  If this is normal, breast MRI will be planned.

## 2019-11-30 NOTE — Telephone Encounter (Signed)
Patient placed in MMG hold.  

## 2019-12-02 ENCOUNTER — Other Ambulatory Visit: Payer: Self-pay

## 2019-12-02 ENCOUNTER — Ambulatory Visit
Admission: RE | Admit: 2019-12-02 | Discharge: 2019-12-02 | Disposition: A | Discharge status: Home / Self Care | Payer: 59 | Source: Ambulatory Visit | Attending: Obstetrics & Gynecology | Admitting: Obstetrics & Gynecology

## 2019-12-02 DIAGNOSIS — N6489 Other specified disorders of breast: Secondary | ICD-10-CM | POA: Diagnosis not present

## 2019-12-02 DIAGNOSIS — N6459 Other signs and symptoms in breast: Secondary | ICD-10-CM

## 2019-12-02 DIAGNOSIS — R928 Other abnormal and inconclusive findings on diagnostic imaging of breast: Secondary | ICD-10-CM | POA: Diagnosis not present

## 2019-12-07 ENCOUNTER — Encounter: Payer: Self-pay | Admitting: Obstetrics & Gynecology

## 2019-12-08 ENCOUNTER — Telehealth: Payer: Self-pay | Admitting: *Deleted

## 2019-12-08 DIAGNOSIS — N6453 Retraction of nipple: Secondary | ICD-10-CM

## 2019-12-08 DIAGNOSIS — N6459 Other signs and symptoms in breast: Secondary | ICD-10-CM

## 2019-12-08 NOTE — Telephone Encounter (Signed)
Burnice Logan, RN  12/08/2019 4:21 PM EDT Back to Top    Call to patient, no answer. Voicemail full, unable to leave message.  Se telephone encounter dated 12/08/19.   Removed from MMG hold.  Placed in [place] hold.    Order placed for MR breast bilateral w/wo contrast at [facility].  Dx: left nipple retraction

## 2019-12-08 NOTE — Telephone Encounter (Signed)
MyChart message to patient.  

## 2019-12-08 NOTE — Telephone Encounter (Signed)
-----   Message from Megan Salon, MD sent at 12/06/2019  9:24 AM EDT ----- Please let pt know her MMG and ultrasound was normal.  She has new left nipple retraction so needs breast MRI.  Please proceed to schedule.  She knows this is the plan and is expecting this call.  Ok to remove from imaging hold for MMG.

## 2019-12-09 DIAGNOSIS — H52223 Regular astigmatism, bilateral: Secondary | ICD-10-CM | POA: Diagnosis not present

## 2019-12-09 DIAGNOSIS — H532 Diplopia: Secondary | ICD-10-CM | POA: Diagnosis not present

## 2019-12-09 DIAGNOSIS — H524 Presbyopia: Secondary | ICD-10-CM | POA: Diagnosis not present

## 2019-12-09 DIAGNOSIS — H5213 Myopia, bilateral: Secondary | ICD-10-CM | POA: Diagnosis not present

## 2019-12-09 NOTE — Telephone Encounter (Signed)
Per review of Epic, patient is scheduled for MRI on 12/20/19 at 3pm.   Routing to [facility] for precert.

## 2019-12-14 NOTE — Progress Notes (Deleted)
Virtual Visit via Video Note  I connected with Cheryl Chan on 12/14/19 at  1:00 PM EDT by a video enabled telemedicine application and verified that I am speaking with the correct person using two identifiers.   I discussed the limitations of evaluation and management by telemedicine and the availability of in person appointments. The patient expressed understanding and agreed to proceed.  Present for the visit:  Myself, Dr Billey Gosling, Leana Gamer.  The patient is currently at home and I am in the office.    No referring provider.    History of Present Illness: She is here for follow up of her chronic medical conditions.    Hypothyroidism:  She is taking her medication daily.  She denies any recent changes in energy or weight that are unexplained.   Diabetes: She is taking her medication daily as prescribed. She is compliant with a diabetic diet.  She monitors her sugars and they have been running XXX.    Hyperlipidemia: She is taking her repatha as prescribed. She is compliant with a low fat/cholesterol diet.      ROS    Social History   Socioeconomic History  . Marital status: Married    Spouse name: Not on file  . Number of children: Not on file  . Years of education: Not on file  . Highest education level: Not on file  Occupational History  . Not on file  Tobacco Use  . Smoking status: Never Smoker  . Smokeless tobacco: Never Used  Substance and Sexual Activity  . Alcohol use: Not Currently  . Drug use: No  . Sexual activity: Yes    Partners: Male    Birth control/protection: Surgical    Comment: hysterectomy  Other Topics Concern  . 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 Difficulty of Paying Living Expenses:    Food Insecurity:   . Worried About Charity fundraiser in the Last Year:   . Arboriculturist in the Last Year:   Transportation Needs:   . Film/video editor (Medical):   Marland Kitchen Lack of Transportation (Non-Medical):   Physical Activity:   . Days of Exercise per Week:   . Minutes of Exercise per Session:   Stress:   . Feeling of Stress :   Social Connections:   . Frequency of Communication with Friends and Family:   . Frequency of Social Gatherings with Friends and Family:   . Attends Religious Services:   . Active Member of Clubs or Organizations:   . Attends Archivist Meetings:   Marland Kitchen Marital Status:      Observations/Objective: Appears well in NAD   Assessment and Plan:  See Problem List for Assessment and Plan of chronic medical problems.   Follow Up Instructions:    I discussed the assessment and treatment plan with the patient. The patient was provided an opportunity to ask questions and all were answered. The patient agreed with the plan and demonstrated an understanding of the instructions.   The patient was advised to call back or seek an in-person evaluation if the symptoms worsen or if the condition fails to improve as anticipated.  Binnie Rail, MD

## 2019-12-15 ENCOUNTER — Encounter: Payer: Self-pay | Admitting: Internal Medicine

## 2019-12-15 ENCOUNTER — Other Ambulatory Visit: Payer: Self-pay | Admitting: Internal Medicine

## 2019-12-15 ENCOUNTER — Other Ambulatory Visit: Payer: Self-pay

## 2019-12-15 ENCOUNTER — Ambulatory Visit (INDEPENDENT_AMBULATORY_CARE_PROVIDER_SITE_OTHER): Payer: 59 | Admitting: Internal Medicine

## 2019-12-15 VITALS — BP 128/78 | HR 88 | Temp 98.6°F | Ht 62.0 in | Wt 152.0 lb

## 2019-12-15 DIAGNOSIS — E1165 Type 2 diabetes mellitus with hyperglycemia: Secondary | ICD-10-CM | POA: Diagnosis not present

## 2019-12-15 DIAGNOSIS — E119 Type 2 diabetes mellitus without complications: Secondary | ICD-10-CM | POA: Diagnosis not present

## 2019-12-15 DIAGNOSIS — E7801 Familial hypercholesterolemia: Secondary | ICD-10-CM | POA: Diagnosis not present

## 2019-12-15 DIAGNOSIS — F3289 Other specified depressive episodes: Secondary | ICD-10-CM

## 2019-12-15 DIAGNOSIS — E039 Hypothyroidism, unspecified: Secondary | ICD-10-CM | POA: Diagnosis not present

## 2019-12-15 MED ORDER — SERTRALINE HCL 100 MG PO TABS: 100.0000 mg | ORAL_TABLET | Freq: Every day | ORAL | 3 refills | Status: DC

## 2019-12-15 MED FILL — SERTRALINE HCL 100 MG TAB: 100 | 90 days supply | Qty: 90 | Fill #0

## 2019-12-15 NOTE — Assessment & Plan Note (Signed)
Chronic Continue farxiga Check a1c, urine micro Low sugar / carb diet Stressed regular exercise

## 2019-12-15 NOTE — Assessment & Plan Note (Signed)
Chronic On repatha Lipids well controlled

## 2019-12-15 NOTE — Patient Instructions (Addendum)
  Blood work was ordered.   ° ° °Medications reviewed and updated.  Changes include :   none ° ° ° °Please followup in 6 months ° ° °

## 2019-12-15 NOTE — Progress Notes (Signed)
Subjective:    Patient ID: Cheryl Chan, female    DOB: [birth year], 64 y.o.   MRN: [id number]  HPI The patient is here for follow up of their chronic medical problems, including DM, hypothyroidism, hyperlipidemia.  She is taking all of her medications as prescribed.    She is not exercising regularly.   She is compliant with a diabetic diet most of the time.      Medications and allergies reviewed with patient and updated if appropriate.  Patient Active Problem List   Diagnosis Date Noted  . Thyroid nodule 12/24/2018  . Osteoarthritis 04/22/2017  . Onychomycosis of toenail 09/25/2016  . Overweight 01/25/2015  . Rotator cuff injury   . Uterine cancer ([place]) 11/12/2011  . Non-insulin dependent type 2 diabetes mellitus ([place])   . Hypothyroidism   . Hyperlipidemia   . Depression     Current Outpatient Medications on File Prior to Visit  Medication Sig Dispense Refill  . dapagliflozin propanediol (FARXIGA) 10 MG TABS tablet Take 10 mg by mouth daily before breakfast. 90 tablet 1  . Evolocumab (REPATHA SURECLICK) 621 MG/ML SOAJ Inject 1 Dose into the skin every 14 (fourteen) days. 2 pen 11  . fluticasone (FLONASE) 50 MCG/ACT nasal spray PLACE 2 SPRAYS INTO BOTH NOSTRILS DAILY. 48 g 2  . glucose blood (ACCU-CHEK GUIDE) test strip Test three times a day as needed 100 each 12  . Lancets (FREESTYLE) lancets Use as instructed 100 each 12  . levothyroxine (SYNTHROID) 88 MCG tablet TAKE 1 TABLET BY MOUTH DAILY BEFORE BREAKFAST. 90 tablet 1   No current facility-administered medications on file prior to visit.    Past Medical History:  Diagnosis Date  . Anxiety   . Arthritis   . Complex endometrial hyperplasia 2005  . 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 Diet-controlled type 2 diabetes mellitus ([place])   . Endometrial cancer ([place]) 10/2011 dx   s/p vag hyst 11/2011  . History of chicken pox   . Hyperlipidemia   . Hypothyroidism   . 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 Rotator cuff injury   . Shingles     Past Surgical History:  Procedure Laterality Date  . ABDOMINAL HYSTERECTOMY    . CESAREAN SECTION     x's 4  . DILATION AND CURETTAGE OF UTERUS     hysteroscopy  . FRACTURE SURGERY     nose as a child  . OPEN REDUCTION INTERNAL FIXATION (ORIF) DISTAL PHALANX Left 08/05/2012   Procedure: OPEN REDUCTION INTERNAL FIXATION (ORIF) DISTAL PHALANX VERSUS EXTERNAL FIXATION LEFT RING FINGER;  Surgeon: Tennis Must, MD;  Location: [facility];  Service: Orthopedics;  Laterality: Left;  . ROBOTIC ASSISTED LAP VAGINAL HYSTERECTOMY  12/04/2011   Procedure: ROBOTIC ASSISTED LAPAROSCOPIC VAGINAL HYSTERECTOMY;  Surgeon: Imagene Gurney A. Alycia Rossetti, MD;  [facility];  Service: Gynecology;  Laterality: N/A;  . SHOULDER ARTHROSCOPY WITH ROTATOR CUFF REPAIR AND SUBACROMIAL DECOMPRESSION Left 11/27/2013   Procedure: LEFT SHOULDER ARTHROSCOPY, SUBACROMIAL DECOMPRESSION, PARTIAL ACROMIOPLASTY WITH  CORACOACROMIAL RELEASE, DISTAL CLAVICULECTOMY WITH ROTATOR CUFF REPAIR AND EXTENSIVE DEBRIDEMENT;  Surgeon: Lorn Junes, MD;  Location: [facility];  Service: Orthopedics;  Laterality: Left;    Social History   Socioeconomic History  . Marital status: Married    Spouse name: Not on file  . Number of children: Not on file  . Years of education: Not on file  . Highest education level:  Not on file  Occupational History  . Not on file  Tobacco Use  . Smoking status: Never Smoker  . Smokeless tobacco: Never Used  Substance and Sexual Activity  . Alcohol use: Not Currently  . Drug use: No  . Sexual activity: Yes    Partners: Male    Birth control/protection: Surgical    Comment: hysterectomy  Other Topics Concern  . 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 Difficulty of Paying Living Expenses:   Food Insecurity:   . Worried About Charity fundraiser in the Last Year:   . Arboriculturist in the Last Year:   Transportation Needs:   . Film/video editor (Medical):   Marland Kitchen Lack of Transportation (Non-Medical):   Physical Activity:   . Days of Exercise per Week:   . Minutes of Exercise per Session:   Stress:   . Feeling of Stress :   Social Connections:   . Frequency of Communication with Friends and Family:   . Frequency of Social Gatherings with Friends and Family:   . Attends Religious Services:   . Active Member of Clubs or Organizations:   . Attends Archivist Meetings:   Marland Kitchen Marital Status:     Family History  Problem Relation Age of Onset  . Arthritis Mother   . Cancer Father 57       prostrate  . Hypertension Father   . Stroke Father        in setting of sepsis f/ RMSF  . Heart disease Brother 48       cardiomyopathy s/p ICD, on transplant list  . Heart failure Brother   . Heart failure Other     Review of Systems  Constitutional: Negative for chills and fever.  Respiratory: Negative for cough, shortness of breath and wheezing.   Cardiovascular: Negative for chest pain, palpitations and leg swelling.  Neurological: Negative for light-headedness and headaches.       Objective:   Vitals:   12/15/19 1306  BP: 128/78  Pulse: 88  Temp: 98.6 F (37 C)  SpO2: 96%   BP Readings from Last 3 Encounters:  12/15/19 128/78  11/27/19 112/70  07/31/19 130/80   Wt Readings from Last 3 Encounters:  12/15/19 152 lb (68.9 kg)  11/27/19 150 lb (68 kg)  07/31/19 159 lb (72.1 kg)   Body mass index is 27.8 kg/m.   Physical Exam    Constitutional: Appears well-developed and well-nourished. No distress.  HENT:  Head: Normocephalic and atraumatic.  Neck: Neck supple. No tracheal deviation present. No thyromegaly present.  No cervical  lymphadenopathy Cardiovascular: Normal rate, regular rhythm and normal heart sounds.   No murmur heard. No carotid bruit .  No edema Pulmonary/Chest: Effort normal and breath sounds normal. No respiratory distress. No has no wheezes. No rales.  Skin: Skin is warm and dry. Not diaphoretic.  Psychiatric: Normal mood and affect. Behavior is normal.      Assessment & Plan:    See Problem List for Assessment and Plan of chronic medical problems.    This visit occurred during the SARS-CoV-2 public health emergency.  Safety protocols were in place,  including screening questions prior to the visit, additional usage of staff PPE, and extensive cleaning of exam room while observing appropriate contact time as indicated for disinfecting solutions.

## 2019-12-15 NOTE — Assessment & Plan Note (Signed)
Chronic  Clinically euthyroid Check tsh  Titrate med dose if needed  

## 2019-12-15 NOTE — Addendum Note (Signed)
Addended by: Steward Ros on: 12/15/2019 01:33 PM   Modules accepted: Orders

## 2019-12-15 NOTE — Assessment & Plan Note (Addendum)
Chronic Restarted sertraline and it has helped  Continue sertraline 100 mg daily

## 2019-12-16 LAB — COMPLETE METABOLIC PANEL WITH GFR
AG Ratio: 1.5 (calc) (ref 1.0–2.5)
ALT: 11 U/L (ref 6–29)
AST: 13 U/L (ref 10–35)
Albumin: 4.1 g/dL (ref 3.6–5.1)
Alkaline phosphatase (APISO): 76 U/L (ref 37–153)
BUN/Creatinine Ratio: 44 (calc) — ABNORMAL HIGH (ref 6–22)
BUN: 27 mg/dL — ABNORMAL HIGH (ref 7–25)
CO2: 25 mmol/L (ref 20–32)
Calcium: 8.9 mg/dL (ref 8.6–10.4)
Chloride: 104 mmol/L (ref 98–110)
Creat: 0.62 mg/dL (ref 0.50–0.99)
GFR, Est African American: 111 mL/min/{1.73_m2} (ref >=60)
GFR, Est Non African American: 96 mL/min/{1.73_m2} (ref >=60)
Globulin: 2.7 g/dL (calc) (ref 1.9–3.7)
Glucose, Bld: 137 mg/dL — ABNORMAL HIGH (ref 65–99)
Potassium: 4 mmol/L (ref 3.5–5.3)
Sodium: 140 mmol/L (ref 135–146)
Total Bilirubin: 0.5 mg/dL (ref 0.2–1.2)
Total Protein: 6.8 g/dL (ref 6.1–8.1)

## 2019-12-16 LAB — MICROALBUMIN / CREATININE URINE RATIO
Creatinine, Urine: 49 mg/dL (ref 20–275)
Microalb Creat Ratio: 4 mcg/mg creat (ref <30)
Microalb, Ur: 0.2 mg/dL

## 2019-12-16 LAB — TSH: TSH: 2.34 mIU/L (ref 0.40–4.50)

## 2019-12-16 LAB — HEMOGLOBIN A1C
Hgb A1c MFr Bld: 7.3 % of total Hgb — ABNORMAL HIGH (ref <5.7)
Mean Plasma Glucose: 163 (calc)
eAG (mmol/L): 9 (calc)

## 2019-12-17 NOTE — Telephone Encounter (Signed)
Routing to Dr. Miller FYI.   Encounter closed.  

## 2019-12-20 ENCOUNTER — Ambulatory Visit
Admission: RE | Admit: 2019-12-20 | Discharge: 2019-12-20 | Disposition: A | Discharge status: Home / Self Care | Payer: 59 | Source: Ambulatory Visit | Attending: Obstetrics & Gynecology | Admitting: Obstetrics & Gynecology

## 2019-12-20 DIAGNOSIS — N6489 Other specified disorders of breast: Secondary | ICD-10-CM | POA: Diagnosis not present

## 2019-12-20 DIAGNOSIS — N6453 Retraction of nipple: Secondary | ICD-10-CM

## 2019-12-20 MED ORDER — GADOBUTROL 1 MMOL/ML IV SOLN
7.0000 mL | Freq: Once | INTRAVENOUS | Status: AC | PRN
  Administered 2019-12-20: 7 mL via INTRAVENOUS

## 2019-12-21 MED FILL — REPATHA SURECLICK 140 MG/ML: 140 | 28 days supply | Qty: 2 | Fill #5

## 2020-01-12 MED FILL — FARXIGA 10 MG TABLET: 10 | 90 days supply | Qty: 90 | Fill #1

## 2020-01-15 MED FILL — REPATHA SURECLICK 140 MG/ML: 140 | 28 days supply | Qty: 2 | Fill #6

## 2020-01-15 MED FILL — FLUTICASONE PROP 50 MCG SPR: 50 | 90 days supply | Qty: 48 | Fill #1

## 2020-01-17 DIAGNOSIS — Z23 Encounter for immunization: Secondary | ICD-10-CM | POA: Diagnosis not present

## 2020-02-19 ENCOUNTER — Other Ambulatory Visit: Payer: Self-pay

## 2020-02-19 ENCOUNTER — Ambulatory Visit (INDEPENDENT_AMBULATORY_CARE_PROVIDER_SITE_OTHER): Payer: 59 | Admitting: Internal Medicine

## 2020-02-19 ENCOUNTER — Encounter: Payer: Self-pay | Admitting: Internal Medicine

## 2020-02-19 VITALS — BP 118/62 | HR 65 | Ht 62.0 in | Wt 152.0 lb

## 2020-02-19 DIAGNOSIS — E7801 Familial hypercholesterolemia: Secondary | ICD-10-CM | POA: Diagnosis not present

## 2020-02-19 DIAGNOSIS — E119 Type 2 diabetes mellitus without complications: Secondary | ICD-10-CM

## 2020-02-19 NOTE — Progress Notes (Signed)
LIPID CLINIC CONSULT NOTE  Chief Complaint:  Manage dyslipidemia  Primary Care Physician: Binnie Rail, MD  Primary Cardiologist:  No primary care provider on file.  HPI:  Cheryl Chan is a 64 y.o. female who is being seen today for the evaluation of dyslipidemia at the request of Burns, Claudina Lick, MD.  This is a pleasant 64 year old female who is referred for evaluation management of dyslipidemia.  She reports a longstanding history of high cholesterol and has been statin intolerant for a number of years.  Despite significant exercise and dietary changes in the past she is not been able to get adequate control of her cholesterol.  She also reports that her mother had high cholesterol and that there is heart disease in her family.  Her brother was diagnosed in 2006 with a nonischemic cardiomyopathy ultimately died.  She underwent familial genetic testing with Dr. Broadus John and was referred to Dr. Ronna Polio in our heart failure clinic to see if she possibly could be at risk for ARVC.  Ultimately she had a cardiac MRI in February 2020 which showed normal biventricular function.  He had a stress echo in 2011 at the direction of Dr. Bea Laura which was negative for ischemia.  Her husband is a Software engineer at the [employer].  She had several questions regarding her elevated cholesterol.  Incidentally her most recent lipid showed total cholesterol 285, triglycerides 220, HDL 55 and direct LDL 195.  Her LDL has been well over 200 in the past, suggestive of a familial hyperlipidemia.  Surprisingly, she has not had prior cardiovascular events that were aware of.  She currently denies any coronary symptoms.  02/19/2020  This is for is seen today in follow-up.  Overall she is doing well with Repatha.  She denies any significant side effects with that.  She has had an excellent response to Repatha with marked improvement in her cholesterol.  Total cholesterol is now 170, HDL 61, LDL 87 and  triglycerides 126, improved from a direct LDL of 195.  PMHx:  Past Medical History:  Diagnosis Date  . Anxiety   . Arthritis   . Complex endometrial hyperplasia 2005  . 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 Diet-controlled type 2 diabetes mellitus ([place])   . Endometrial cancer ([place]) 10/2011 dx   s/p vag hyst 11/2011  . History of chicken pox   . Hyperlipidemia   . Hypothyroidism   . 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 Rotator cuff injury   . Shingles     Past Surgical History:  Procedure Laterality Date  . ABDOMINAL HYSTERECTOMY    . CESAREAN SECTION     x's 4  . DILATION AND CURETTAGE OF UTERUS     hysteroscopy  . FRACTURE SURGERY     nose as a child  . OPEN REDUCTION INTERNAL FIXATION (ORIF) DISTAL PHALANX Left 08/05/2012   Procedure: OPEN REDUCTION INTERNAL FIXATION (ORIF) DISTAL PHALANX VERSUS EXTERNAL FIXATION LEFT RING FINGER;  Surgeon: Tennis Must, MD;  Location: [facility];  Service: Orthopedics;  Laterality: Left;  . ROBOTIC ASSISTED LAP VAGINAL HYSTERECTOMY  12/04/2011   Procedure: ROBOTIC ASSISTED LAPAROSCOPIC VAGINAL HYSTERECTOMY;  Surgeon: Imagene Gurney A. Alycia Rossetti, MD;  Location: [facility];  Service: Gynecology;  Laterality: N/A;  . SHOULDER ARTHROSCOPY WITH ROTATOR CUFF REPAIR AND SUBACROMIAL DECOMPRESSION Left 11/27/2013   Procedure: LEFT SHOULDER ARTHROSCOPY, SUBACROMIAL DECOMPRESSION, PARTIAL ACROMIOPLASTY WITH  CORACOACROMIAL RELEASE, DISTAL CLAVICULECTOMY  WITH ROTATOR CUFF REPAIR AND EXTENSIVE DEBRIDEMENT;  Surgeon: Lorn Junes, MD;  Location: [facility];  Service: Orthopedics;  Laterality: Left;    FAMHx:  Family History  Problem Relation Age of Onset  . Arthritis Mother   . Cancer Father 55       prostrate  . Hypertension Father   . Stroke Father        in setting of sepsis f/ RMSF  . Heart disease Brother 60       cardiomyopathy s/p ICD, on transplant list  . Heart failure Brother   . Heart failure Other      SOCHx:   reports that she has never smoked. She has never used smokeless tobacco. She reports previous alcohol use. She reports that she does not use drugs.  ALLERGIES:  Allergies  Allergen Reactions  . Crestor [Rosuvastatin Calcium]     Muscle ache  . Demerol [Meperidine]   . Statins   . Trulicity [Dulaglutide]     Nausea, diarrhea  . Victoza [Liraglutide]     Nausea, diarrhea  . Metformin And Related     diarrhea    ROS: Pertinent items noted in HPI and remainder of comprehensive ROS otherwise negative.  HOME MEDS: Current Outpatient Medications on File Prior to Visit  Medication Sig Dispense Refill  . dapagliflozin propanediol (FARXIGA) 10 MG TABS tablet Take 10 mg by mouth daily before breakfast. 90 tablet 1  . Evolocumab (REPATHA SURECLICK) 631 MG/ML SOAJ Inject 1 Dose into the skin every 14 (fourteen) days. 2 pen 11  . fluticasone (FLONASE) 50 MCG/ACT nasal spray PLACE 2 SPRAYS INTO BOTH NOSTRILS DAILY. 48 g 2  . glucose blood (ACCU-CHEK GUIDE) test strip Test three times a day as needed 100 each 12  . Lancets (FREESTYLE) lancets Use as instructed 100 each 12  . levothyroxine (SYNTHROID) 88 MCG tablet TAKE 1 TABLET BY MOUTH DAILY BEFORE BREAKFAST. 90 tablet 1  . sertraline (ZOLOFT) 100 MG tablet Take 1 tablet (100 mg total) by mouth daily. 90 tablet 3   No current facility-administered medications on file prior to visit.    LABS/IMAGING: No results found for this or any previous visit (from the past 48 hour(s)). No results found.  LIPID PANEL:    Component Value Date/Time   CHOL 170 11/24/2019 0923   TRIG 126 11/24/2019 0923   HDL 61 11/24/2019 0923   CHOLHDL 2.8 11/24/2019 0923   CHOLHDL 5 06/12/2019 0849   VLDL 44.0 (H) 06/12/2019 0849   LDLCALC 87 11/24/2019 0923   LDLDIRECT 195.0 06/12/2019 0849    WEIGHTS: Wt Readings from Last 3 Encounters:  02/19/20 152 lb (68.9 kg)  12/15/19 152 lb (68.9 kg)  11/27/19 150 lb (68 kg)    VITALS: BP 118/62    Pulse 65   Ht 5\' 2"  (1.575 m)   Wt 152 lb (68.9 kg)   LMP 05/22/2003   SpO2 95%   BMI 27.80 kg/m   EXAM: Deferred  EKG: Deferred  ASSESSMENT: 1. Possible familial hyperlipidemia- Baruch Merl Criteria 2. Statin intolerance - myalgias 3. Type 2 diabetes.  PLAN: 1.   Mrs. Wooton has had significant improvement in her lipids with LDL now at 87, more than 50% reduction on Repatha which she is tolerating well.  She denies any symptoms of myalgias.  Her A1c is now improved to 7.3.  Overall significant improvement across the board.  No changes in medicine at this time.  Plan follow-up with me annually  or sooner as necessary.  Pixie Casino, MD, [facility], [place] Director of the Advanced Lipid Disorders &  Cardiovascular Risk Reduction Clinic Diplomate of the American Board of Clinical Lipidology Attending Cardiologist  Direct Dial: [phone]  Fax: [phone]  Website:  [email or web address] 02/19/2020, 8:12 AM

## 2020-02-19 NOTE — Patient Instructions (Signed)
Medication Instructions:  Your physician recommends that you continue on your current medications as directed. Please refer to the Current Medication list given to you today.  *If you need a refill on your cardiac medications before your next appointment, please call your pharmacy*   Follow-Up: At [facility], you and your health needs are our priority.  As part of our continuing mission to provide you with exceptional heart care, we have created designated Provider Care Teams.  These Care Teams include your primary Cardiologist (physician) and Advanced Practice Providers (APPs -  Physician Assistants and Nurse Practitioners) who all work together to provide you with the care you need, when you need it.  We recommend signing up for the patient portal called "MyChart".  Sign up information is provided on this After Visit Summary.  MyChart is used to connect with patients for Virtual Visits (Telemedicine).  Patients are able to view lab/test results, encounter notes, upcoming appointments, etc.  Non-urgent messages can be sent to your provider as well.   To learn more about what you can do with MyChart, go to [email or web address].    Your next appointment:   12 month(s) - lipid clinic -- fasting lab work prior   The format for your next appointment:   In Person  Provider:   Raliegh Ip Mali Hilty, MD   Other Instructions

## 2020-02-22 ENCOUNTER — Other Ambulatory Visit: Payer: Self-pay | Admitting: Internal Medicine

## 2020-02-22 MED FILL — REPATHA SURECLICK 140 MG/ML: 140 | 28 days supply | Qty: 2 | Fill #7

## 2020-02-22 MED FILL — LEVOTHYROXINE 88 MCG TABLET: 88 | 90 days supply | Qty: 90 | Fill #0

## 2020-03-09 MED FILL — FREESTYLE LIBRE 14 DAY SENS: 14 days supply | Qty: 1 | Fill #0

## 2020-03-19 ENCOUNTER — Telehealth: Payer: 59 | Admitting: Orthopedic Surgery

## 2020-03-19 DIAGNOSIS — R0989 Other specified symptoms and signs involving the circulatory and respiratory systems: Secondary | ICD-10-CM

## 2020-03-19 MED ORDER — AMOXICILLIN-POT CLAVULANATE 875-125 MG PO TABS
1.0000 | ORAL_TABLET | Freq: Two times a day (BID) | ORAL | 0 refills | Status: AC
Start: 2020-03-19 — End: 2020-03-26

## 2020-03-19 NOTE — Progress Notes (Signed)

## 2020-03-28 MED FILL — REPATHA SURECLICK 140 MG/ML: 140 | 28 days supply | Qty: 2 | Fill #8

## 2020-04-26 ENCOUNTER — Other Ambulatory Visit: Payer: Self-pay | Admitting: Internal Medicine

## 2020-04-26 MED FILL — REPATHA SURECLICK 140 MG/ML: 140 | 28 days supply | Qty: 2 | Fill #9

## 2020-04-26 MED FILL — FARXIGA 10 MG TABLET: 10 | 90 days supply | Qty: 90 | Fill #0

## 2020-04-29 MED FILL — SERTRALINE HCL 100 MG TAB: 100 | 90 days supply | Qty: 90 | Fill #1

## 2020-05-02 ENCOUNTER — Other Ambulatory Visit (HOSPITAL_COMMUNITY): Payer: Self-pay | Admitting: Gastroenterology

## 2020-05-02 DIAGNOSIS — K591 Functional diarrhea: Secondary | ICD-10-CM | POA: Diagnosis not present

## 2020-05-02 DIAGNOSIS — R198 Other specified symptoms and signs involving the digestive system and abdomen: Secondary | ICD-10-CM | POA: Diagnosis not present

## 2020-05-02 DIAGNOSIS — R1112 Projectile vomiting: Secondary | ICD-10-CM | POA: Diagnosis not present

## 2020-05-02 DIAGNOSIS — R152 Fecal urgency: Secondary | ICD-10-CM | POA: Diagnosis not present

## 2020-05-02 DIAGNOSIS — R159 Full incontinence of feces: Secondary | ICD-10-CM | POA: Diagnosis not present

## 2020-05-09 ENCOUNTER — Ambulatory Visit: Payer: 59 | Admitting: Certified Nurse Midwife

## 2020-06-11 MED FILL — LEVOTHYROXINE 88 MCG TABLET: 88 | 90 days supply | Qty: 90 | Fill #1

## 2020-06-11 MED FILL — REPATHA SURECLICK 140 MG/ML: 140 | 28 days supply | Qty: 2 | Fill #10

## 2020-06-24 DIAGNOSIS — Z01812 Encounter for preprocedural laboratory examination: Secondary | ICD-10-CM | POA: Diagnosis not present

## 2020-06-27 MED FILL — PEG-3350 SOLUTION: 420 | 1 days supply | Qty: 4000 | Fill #0

## 2020-06-28 DIAGNOSIS — R197 Diarrhea, unspecified: Secondary | ICD-10-CM | POA: Diagnosis not present

## 2020-07-18 MED FILL — REPATHA SURECLICK 140 MG/ML: 140 | 28 days supply | Qty: 2 | Fill #11

## 2020-07-20 DIAGNOSIS — Z03818 Encounter for observation for suspected exposure to other biological agents ruled out: Secondary | ICD-10-CM | POA: Diagnosis not present

## 2020-07-20 DIAGNOSIS — Z20822 Contact with and (suspected) exposure to covid-19: Secondary | ICD-10-CM | POA: Diagnosis not present

## 2020-07-20 DIAGNOSIS — J029 Acute pharyngitis, unspecified: Secondary | ICD-10-CM | POA: Diagnosis not present

## 2020-07-26 ENCOUNTER — Telehealth: Payer: Self-pay | Admitting: Internal Medicine

## 2020-07-26 NOTE — Telephone Encounter (Signed)
PA for repatha sureclick submitted via CMM (Key: [id number])

## 2020-07-28 NOTE — Telephone Encounter (Signed)
The request has been approved. The authorization is effective for a maximum of 12 fills from 07/27/2020 to 07/26/2021, as long as the member is enrolled in their current health plan. The request was approved as submitted. A written notification letter will follow with additional details.

## 2020-08-01 ENCOUNTER — Other Ambulatory Visit: Payer: Self-pay | Admitting: Internal Medicine

## 2020-08-01 DIAGNOSIS — Z1231 Encounter for screening mammogram for malignant neoplasm of breast: Secondary | ICD-10-CM

## 2020-08-20 ENCOUNTER — Other Ambulatory Visit (HOSPITAL_COMMUNITY): Payer: Self-pay

## 2020-08-20 MED FILL — Dapagliflozin Propanediol Tab 10 MG (Base Equivalent): ORAL | 90 days supply | Qty: 90 | Fill #0 | Status: AC

## 2020-08-25 ENCOUNTER — Other Ambulatory Visit (HOSPITAL_COMMUNITY): Payer: Self-pay

## 2020-08-25 MED ORDER — FLUTICASONE PROPIONATE 50 MCG/ACT NA SUSP
2.0000 | Freq: Every day | NASAL | 0 refills | Status: AC
Start: 2019-08-28 — End: ?
  Filled 2020-08-25: qty 48, 90d supply, fill #0

## 2020-08-31 ENCOUNTER — Other Ambulatory Visit (HOSPITAL_COMMUNITY): Payer: Self-pay

## 2020-08-31 ENCOUNTER — Other Ambulatory Visit: Payer: Self-pay | Admitting: Internal Medicine

## 2020-09-05 ENCOUNTER — Other Ambulatory Visit (HOSPITAL_COMMUNITY): Payer: Self-pay

## 2020-09-05 ENCOUNTER — Telehealth: Payer: Self-pay | Admitting: Internal Medicine

## 2020-09-05 NOTE — Telephone Encounter (Signed)
The patient called to get a refill but I dont see Repatha listed in her medication. Please advise

## 2020-09-06 ENCOUNTER — Other Ambulatory Visit (HOSPITAL_COMMUNITY): Payer: Self-pay

## 2020-09-06 MED ORDER — REPATHA SURECLICK 140 MG/ML ~~LOC~~ SOAJ
1.0000 | SUBCUTANEOUS | 11 refills | Status: DC
  Filled 2020-09-06: qty 2, 28d supply, fill #0
  Filled 2020-10-12: qty 2, 28d supply, fill #1
  Filled 2020-11-28: qty 2, 28d supply, fill #2
  Filled 2021-01-26: qty 2, 28d supply, fill #3
  Filled 2021-03-01: qty 2, 28d supply, fill #4
  Filled 2021-03-27: qty 6, 84d supply, fill #5
  Filled 2021-06-26: qty 2, 28d supply, fill #6
  Filled 2021-08-15: qty 2, 28d supply, fill #7

## 2020-09-06 NOTE — Telephone Encounter (Signed)
repatha 140mg /mL refilled [facility]

## 2020-09-26 ENCOUNTER — Ambulatory Visit (HOSPITAL_BASED_OUTPATIENT_CLINIC_OR_DEPARTMENT_OTHER): Payer: 59 | Admitting: Obstetrics & Gynecology

## 2020-10-10 ENCOUNTER — Ambulatory Visit
Admission: RE | Admit: 2020-10-10 | Discharge: 2020-10-10 | Disposition: A | Discharge status: Home / Self Care | Payer: 59 | Source: Ambulatory Visit | Attending: Internal Medicine | Admitting: Internal Medicine

## 2020-10-10 ENCOUNTER — Other Ambulatory Visit: Payer: Self-pay

## 2020-10-10 DIAGNOSIS — Z1231 Encounter for screening mammogram for malignant neoplasm of breast: Secondary | ICD-10-CM

## 2020-10-12 ENCOUNTER — Other Ambulatory Visit (HOSPITAL_COMMUNITY): Payer: Self-pay

## 2020-10-16 MED FILL — Sertraline HCl Tab 100 MG: ORAL | 90 days supply | Qty: 90 | Fill #0 | Status: CN

## 2020-10-18 ENCOUNTER — Other Ambulatory Visit (HOSPITAL_COMMUNITY): Payer: Self-pay

## 2020-10-20 ENCOUNTER — Telehealth: Payer: Self-pay | Admitting: Internal Medicine

## 2020-10-20 DIAGNOSIS — Z0000 Encounter for general adult medical examination without abnormal findings: Secondary | ICD-10-CM

## 2020-10-20 DIAGNOSIS — E039 Hypothyroidism, unspecified: Secondary | ICD-10-CM

## 2020-10-20 DIAGNOSIS — E119 Type 2 diabetes mellitus without complications: Secondary | ICD-10-CM

## 2020-10-20 DIAGNOSIS — E782 Mixed hyperlipidemia: Secondary | ICD-10-CM

## 2020-10-20 NOTE — Telephone Encounter (Signed)
Patient is requesting lab orders before her 10/26/20 CPE. She can be reached at [phone]. Please advise

## 2020-10-20 NOTE — Telephone Encounter (Signed)
Fasting blood work ordered.  For [facility]  Her appointment is actually 6/7

## 2020-10-20 NOTE — Telephone Encounter (Signed)
Appointment made

## 2020-10-21 ENCOUNTER — Other Ambulatory Visit (INDEPENDENT_AMBULATORY_CARE_PROVIDER_SITE_OTHER): Payer: 59

## 2020-10-21 ENCOUNTER — Other Ambulatory Visit: Payer: Self-pay

## 2020-10-21 DIAGNOSIS — Z0000 Encounter for general adult medical examination without abnormal findings: Secondary | ICD-10-CM

## 2020-10-21 DIAGNOSIS — E782 Mixed hyperlipidemia: Secondary | ICD-10-CM

## 2020-10-21 DIAGNOSIS — E119 Type 2 diabetes mellitus without complications: Secondary | ICD-10-CM | POA: Diagnosis not present

## 2020-10-21 DIAGNOSIS — E039 Hypothyroidism, unspecified: Secondary | ICD-10-CM

## 2020-10-21 LAB — CBC WITH DIFFERENTIAL/PLATELET
Basophils Absolute: 0 10*3/uL (ref 0.0–0.1)
Basophils Relative: 0.4 % (ref 0.0–3.0)
Eosinophils Absolute: 0.1 10*3/uL (ref 0.0–0.7)
Eosinophils Relative: 2 % (ref 0.0–5.0)
HCT: 44.2 % (ref 36.0–46.0)
Hemoglobin: 14.8 g/dL (ref 12.0–15.0)
Lymphocytes Relative: 29.5 % (ref 12.0–46.0)
Lymphs Abs: 1.5 10*3/uL (ref 0.7–4.0)
MCHC: 33.4 g/dL (ref 30.0–36.0)
MCV: 88.5 fl (ref 78.0–100.0)
Monocytes Absolute: 0.5 10*3/uL (ref 0.1–1.0)
Monocytes Relative: 8.9 % (ref 3.0–12.0)
Neutro Abs: 3.1 10*3/uL (ref 1.4–7.7)
Neutrophils Relative %: 59.2 % (ref 43.0–77.0)
Platelets: 232 10*3/uL (ref 150.0–400.0)
RBC: 5 Mil/uL (ref 3.87–5.11)
RDW: 14.1 % (ref 11.5–15.5)
WBC: 5.2 10*3/uL (ref 4.0–10.5)

## 2020-10-21 LAB — TSH: TSH: 3.51 u[IU]/mL (ref 0.35–4.50)

## 2020-10-21 LAB — HEMOGLOBIN A1C: Hgb A1c MFr Bld: 8.1 % — ABNORMAL HIGH (ref 4.6–6.5)

## 2020-10-23 ENCOUNTER — Other Ambulatory Visit (HOSPITAL_COMMUNITY): Payer: Self-pay

## 2020-10-24 ENCOUNTER — Other Ambulatory Visit (HOSPITAL_COMMUNITY): Payer: Self-pay

## 2020-10-24 ENCOUNTER — Other Ambulatory Visit: Payer: Self-pay

## 2020-10-24 LAB — COMPREHENSIVE METABOLIC PANEL
ALT: 12 U/L (ref 0–35)
AST: 24 U/L (ref 0–37)
Albumin: 4.3 g/dL (ref 3.5–5.2)
Alkaline Phosphatase: 67 U/L (ref 39–117)
BUN: 27 mg/dL — ABNORMAL HIGH (ref 6–23)
CO2: 25 mEq/L (ref 19–32)
Calcium: 9.5 mg/dL (ref 8.4–10.5)
Chloride: 100 mEq/L (ref 96–112)
Creatinine, Ser: 0.61 mg/dL (ref 0.40–1.20)
GFR: 94.47 mL/min (ref >60.00)
Glucose, Bld: 134 mg/dL — ABNORMAL HIGH (ref 70–99)
Potassium: 4.2 mEq/L (ref 3.5–5.1)
Sodium: 138 mEq/L (ref 135–145)
Total Bilirubin: 0.9 mg/dL (ref 0.2–1.2)
Total Protein: 7.6 g/dL (ref 6.0–8.3)

## 2020-10-24 LAB — LIPID PANEL
Cholesterol: 196 mg/dL (ref 0–200)
HDL: 70.6 mg/dL (ref >39.00)
LDL Cholesterol: 106 mg/dL — ABNORMAL HIGH (ref 0–99)
NonHDL: 125.89
Total CHOL/HDL Ratio: 3
Triglycerides: 97 mg/dL (ref 0.0–149.0)
VLDL: 19.4 mg/dL (ref 0.0–40.0)

## 2020-10-24 NOTE — Patient Instructions (Addendum)
Medications changes include :   Nitrofurantoin for UTI,  Diflucan now and after the antibiotic. Glimepiride for your sugars.  Your prescription(s) have been submitted to your pharmacy. Please take as directed and contact our office if you believe you are having problem(s) with the medication(s).    Please followup in 6 months    Health Maintenance, Female Adopting a healthy lifestyle and getting preventive care are important in promoting health and wellness. Ask your health care provider about:  The right schedule for you to have regular tests and exams.  Things you can do on your own to prevent diseases and keep yourself healthy. What should I know about diet, weight, and exercise? Eat a healthy diet  Eat a diet that includes plenty of vegetables, fruits, low-fat dairy products, and lean protein.  Do not eat a lot of foods that are high in solid fats, added sugars, or sodium.   Maintain a healthy weight Body mass index (BMI) is used to identify weight problems. It estimates body fat based on height and weight. Your health care provider can help determine your BMI and help you achieve or maintain a healthy weight. Get regular exercise Get regular exercise. This is one of the most important things you can do for your health. Most adults should:  Exercise for at least 150 minutes each week. The exercise should increase your heart rate and make you sweat (moderate-intensity exercise).  Do strengthening exercises at least twice a week. This is in addition to the moderate-intensity exercise.  Spend less time sitting. Even light physical activity can be beneficial. Watch cholesterol and blood lipids Have your blood tested for lipids and cholesterol at 65 years of age, then have this test every 5 years. Have your cholesterol levels checked more often if:  Your lipid or cholesterol levels are high.  You are older than 65 years of age.  You are at high risk for heart  disease. What should I know about cancer screening? Depending on your health history and family history, you may need to have cancer screening at various ages. This may include screening for:  Breast cancer.  Cervical cancer.  Colorectal cancer.  Skin cancer.  Lung cancer. What should I know about heart disease, diabetes, and high blood pressure? Blood pressure and heart disease  High blood pressure causes heart disease and increases the risk of stroke. This is more likely to develop in people who have high blood pressure readings, are of African descent, or are overweight.  Have your blood pressure checked: ? Every 3-5 years if you are 43-74 years of age. ? Every year if you are 21 years old or older. Diabetes Have regular diabetes screenings. This checks your fasting blood sugar level. Have the screening done:  Once every three years after age 27 if you are at a normal weight and have a low risk for diabetes.  More often and at a younger age if you are overweight or have a high risk for diabetes. What should I know about preventing infection? Hepatitis B If you have a higher risk for hepatitis B, you should be screened for this virus. Talk with your health care provider to find out if you are at risk for hepatitis B infection. Hepatitis C Testing is recommended for:  Everyone born from [birth year] through 1965.  Anyone with known risk factors for hepatitis C. Sexually transmitted infections (STIs)  Get screened for STIs, including gonorrhea and chlamydia, if: ? You are sexually active  and are younger than 65 years of age. ? You are older than 65 years of age and your health care provider tells you that you are at risk for this type of infection. ? Your sexual activity has changed since you were last screened, and you are at increased risk for chlamydia or gonorrhea. Ask your health care provider if you are at risk.  Ask your health care provider about whether you are at high  risk for HIV. Your health care provider may recommend a prescription medicine to help prevent HIV infection. If you choose to take medicine to prevent HIV, you should first get tested for HIV. You should then be tested every 3 months for as long as you are taking the medicine. Pregnancy  If you are about to stop having your period (premenopausal) and you may become pregnant, seek counseling before you get pregnant.  Take 400 to 800 micrograms (mcg) of folic acid every day if you become pregnant.  Ask for birth control (contraception) if you want to prevent pregnancy. Osteoporosis and menopause Osteoporosis is a disease in which the bones lose minerals and strength with aging. This can result in bone fractures. If you are 52 years old or older, or if you are at risk for osteoporosis and fractures, ask your health care provider if you should:  Be screened for bone loss.  Take a calcium or vitamin D supplement to lower your risk of fractures.  Be given hormone replacement therapy (HRT) to treat symptoms of menopause. Follow these instructions at home: Lifestyle  Do not use any products that contain nicotine or tobacco, such as cigarettes, e-cigarettes, and chewing tobacco. If you need help quitting, ask your health care provider.  Do not use street drugs.  Do not share needles.  Ask your health care provider for help if you need support or information about quitting drugs. Alcohol use  Do not drink alcohol if: ? Your health care provider tells you not to drink. ? You are pregnant, may be pregnant, or are planning to become pregnant.  If you drink alcohol: ? Limit how much you use to 0-1 drink a day. ? Limit intake if you are breastfeeding.  Be aware of how much alcohol is in your drink. In the [place] one drink equals one 12 oz bottle of beer (355 mL), one 5 oz glass of wine (148 mL), or one 1 oz glass of hard liquor (44 mL). General instructions  Schedule regular health, dental,  and eye exams.  Stay current with your vaccines.  Tell your health care provider if: ? You often feel depressed. ? You have ever been abused or do not feel safe at home. Summary  Adopting a healthy lifestyle and getting preventive care are important in promoting health and wellness.  Follow your health care provider's instructions about healthy diet, exercising, and getting tested or screened for diseases.  Follow your health care provider's instructions on monitoring your cholesterol and blood pressure. This information is not intended to replace advice given to you by your health care provider. Make sure you discuss any questions you have with your health care provider. Document Revised: 04/30/2018 Document Reviewed: 04/30/2018 Elsevier Patient Education  2021 [employer].

## 2020-10-24 NOTE — Progress Notes (Addendum)
Subjective:    Patient ID: Cheryl Chan, female    DOB: 1956-02-11, 65 y.o.   MRN: [id number]   This visit occurred during the SARS-CoV-2 public health emergency.  Safety protocols were in place, including screening questions prior to the visit, additional usage of staff PPE, and extensive cleaning of exam room while observing appropriate contact time as indicated for disinfecting solutions.    HPI She is here for a physical exam.   Having a couple of days of urgency, dysuria and fatigue. She also has itching.  She thinks she has a uti and yeast infection.     Having diarrhea - explosive and some leakage.  this has been going on for months.  She saw Dr Cristina Gong - tried probiotics and a few other things.  She did cut carbs out and that did help.  She has not been careful with her diet recently and has been eating too many carbs.  She knows she needs to try to cut them out again to see if that helps.  She is also thinking about restarting probiotics.    Medications and allergies reviewed with patient and updated if appropriate.  Patient Active Problem List   Diagnosis Date Noted  . Thyroid nodule 12/24/2018  . Osteoarthritis 04/22/2017  . Onychomycosis of toenail 09/25/2016  . Overweight 01/25/2015  . Rotator cuff injury   . History of uterine cancer 11/12/2011  . Non-insulin dependent type 2 diabetes mellitus ([place])   . Hypothyroidism   . Hyperlipidemia   . Depression     Current Outpatient Medications on File Prior to Visit  Medication Sig Dispense Refill  . dapagliflozin propanediol (FARXIGA) 10 MG TABS tablet TAKE 1 TABLET BY MOUTH ONCE DAILY BEFORE BREAKFAST 90 tablet 1  . Esomeprazole Magnesium 20 MG TBEC     . Evolocumab (REPATHA SURECLICK) 007 MG/ML SOAJ Inject 1 Dose into the skin every 14 (fourteen) days. 2 mL 11  . fluticasone (FLONASE) 50 MCG/ACT nasal spray Place 2 sprays into both nostrils daily. 48 g 0  . glucose blood (ACCU-CHEK GUIDE) test strip Test three  times a day as needed 100 each 12  . Lancets (FREESTYLE) lancets Use as instructed 100 each 12  . levothyroxine (SYNTHROID) 88 MCG tablet TAKE 1 TABLET BY MOUTH DAILY BEFORE BREAKFAST. 90 tablet 1  . loperamide (IMODIUM) 2 MG capsule     . sertraline (ZOLOFT) 100 MG tablet TAKE 1 TABLET (100 MG TOTAL) BY MOUTH DAILY. 90 tablet 3  . Turmeric (QC TUMERIC COMPLEX) 500 MG CAPS Take by mouth.     No current facility-administered medications on file prior to visit.    Past Medical History:  Diagnosis Date  . Anxiety   . Arthritis   . Complex endometrial hyperplasia 2005  . 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 Diet-controlled type 2 diabetes mellitus ([place])   . Endometrial cancer ([place]) 10/2011 dx   s/p vag hyst 11/2011  . History of chicken pox   . Hyperlipidemia   . Hypothyroidism   . 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 Rotator cuff injury   . Shingles     Past Surgical History:  Procedure Laterality Date  . ABDOMINAL HYSTERECTOMY    . CESAREAN SECTION     x's 4  . DILATION AND CURETTAGE OF UTERUS     hysteroscopy  . FRACTURE SURGERY     nose as a child  . OPEN REDUCTION INTERNAL FIXATION (  ORIF) DISTAL PHALANX Left 08/05/2012   Procedure: OPEN REDUCTION INTERNAL FIXATION (ORIF) DISTAL PHALANX VERSUS EXTERNAL FIXATION LEFT RING FINGER;  Surgeon: Tennis Must, MD;  Location: [facility];  Service: Orthopedics;  Laterality: Left;  . ROBOTIC ASSISTED LAP VAGINAL HYSTERECTOMY  12/04/2011   Procedure: ROBOTIC ASSISTED LAPAROSCOPIC VAGINAL HYSTERECTOMY;  Surgeon: Imagene Gurney A. Alycia Rossetti, MD;  Location: [facility];  Service: Gynecology;  Laterality: N/A;  . SHOULDER ARTHROSCOPY WITH ROTATOR CUFF REPAIR AND SUBACROMIAL DECOMPRESSION Left 11/27/2013   Procedure: LEFT SHOULDER ARTHROSCOPY, SUBACROMIAL DECOMPRESSION, PARTIAL ACROMIOPLASTY WITH  CORACOACROMIAL RELEASE, DISTAL CLAVICULECTOMY WITH ROTATOR CUFF REPAIR AND EXTENSIVE DEBRIDEMENT;  Surgeon: Lorn Junes, MD;  Location:  [place];  Service: Orthopedics;  Laterality: Left;    Social History   Socioeconomic History  . Marital status: Married    Spouse name: Not on file  . Number of children: Not on file  . Years of education: Not on file  . Highest education level: Not on file  Occupational History  . Not on file  Tobacco Use  . Smoking status: Never Smoker  . Smokeless tobacco: Never Used  Substance and Sexual Activity  . Alcohol use: Not Currently  . Drug use: No  . Sexual activity: Yes    Partners: Male    Birth control/protection: Surgical    Comment: hysterectomy  Other Topics Concern  . 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 Arthritis Mother   . Cancer Father 61       prostrate  . Hypertension Father   . Stroke Father        in setting of sepsis f/ RMSF  . Heart disease Brother 36       cardiomyopathy s/p ICD, on transplant list  . Heart failure Brother   . Heart failure Other     Review of Systems  Constitutional: Negative for fever.  Eyes: Negative for visual disturbance.  Respiratory: Negative for cough, shortness of breath and wheezing.   Cardiovascular: Negative for chest pain, palpitations and leg swelling.  Gastrointestinal: Positive for abdominal pain (cramping with dairrhea), diarrhea and nausea (occ with diarrhea). Negative for blood in stool and constipation.       Occ reflux  Genitourinary: Positive for dysuria. Negative for frequency and hematuria.       Vulvar itch  Musculoskeletal:  Positive for arthralgias. Negative for back pain.  Skin: Negative for rash.  Neurological: Negative for light-headedness and headaches.  Psychiatric/Behavioral: Positive for dysphoric mood. The patient is nervous/anxious.        Objective:   Vitals:   10/25/20 1107  BP: 112/80  Pulse: 82  Temp: 98.2 F (36.8 C)  SpO2: 97%   Filed Weights   10/25/20 1107  Weight: 148 lb (67.1 kg)   Body mass index is 27.07 kg/m.  BP Readings from Last 3 Encounters:  10/25/20 112/80  02/19/20 118/62  12/15/19 128/78    Wt  Readings from Last 3 Encounters:  10/25/20 148 lb (67.1 kg)  02/19/20 152 lb (68.9 kg)  12/15/19 152 lb (68.9 kg)    Depression screen [facility] 2/9 10/25/2020 12/24/2018 03/26/2018 04/22/2017 09/25/2016  Decreased Interest 0 0 0 0 0  Down, Depressed, Hopeless 0 0 0 0 0  PHQ - 2 Score 0 0 0 0 0  Altered sleeping 1 0 0 - 0  Tired, decreased energy 1 0 0 - 0  Change in appetite 1 0 0 - 0  Feeling bad or failure about yourself  1 0 0 - 0  Trouble concentrating 0 0 0 - 0  Moving slowly or fidgety/restless 0 0 0 - 0  Suicidal thoughts 0 0 0 - 0  PHQ-9 Score 4 0 0 - 0  Difficult doing work/chores Not difficult at all - - - -    GAD 7 : Generalized Anxiety Score 10/25/2020  Nervous, Anxious, on Edge 1  Control/stop worrying 0  Worry too much - different things 0  Trouble relaxing 1  Restless 0  Easily annoyed or irritable 0  Afraid - awful might happen 0  Total GAD 7 Score 2  Anxiety Difficulty Not difficult at all       Physical Exam Constitutional: She appears well-developed and well-nourished. No distress.  HENT:  Head: Normocephalic and atraumatic.  Right Ear: External ear normal. Normal ear canal and TM Left Ear: External ear normal.  Normal ear canal and TM Mouth/Throat: Oropharynx is clear and moist.  Eyes: Conjunctivae and EOM are normal.  Neck: Neck supple. No tracheal deviation present. No thyromegaly present.  No carotid bruit  Cardiovascular: Normal  rate, regular rhythm and normal heart sounds.   No murmur heard.  No edema. Pulmonary/Chest: Effort normal and breath sounds normal. No respiratory distress. She has no wheezes. She has no rales.  Breast: deferred   Abdominal: Soft. She exhibits no distension. There is no tenderness.  Lymphadenopathy: She has no cervical adenopathy.  Skin: Skin is warm and dry. She is not diaphoretic.  Psychiatric: She has a normal mood and affect. Her behavior is normal.        Assessment & Plan:   Physical exam: Screening blood work    ordered Immunizations  Up to date  Colonoscopy  Up to date  Mammogram  Up to date  Gyn  Up to date  Dexa    Due - ordered  Eye exams   Up to date  Exercise  Walking about one mile Weight  Overweight  Substance abuse  none      See Problem List for Assessment and Plan of chronic medical problems.

## 2020-10-25 ENCOUNTER — Other Ambulatory Visit: Payer: Self-pay

## 2020-10-25 ENCOUNTER — Encounter: Payer: Self-pay | Admitting: Internal Medicine

## 2020-10-25 ENCOUNTER — Other Ambulatory Visit (HOSPITAL_COMMUNITY): Payer: Self-pay

## 2020-10-25 ENCOUNTER — Ambulatory Visit (INDEPENDENT_AMBULATORY_CARE_PROVIDER_SITE_OTHER): Payer: 59 | Admitting: Internal Medicine

## 2020-10-25 VITALS — BP 112/80 | HR 82 | Temp 98.2°F | Ht 62.0 in | Wt 148.0 lb

## 2020-10-25 DIAGNOSIS — E039 Hypothyroidism, unspecified: Secondary | ICD-10-CM | POA: Diagnosis not present

## 2020-10-25 DIAGNOSIS — R300 Dysuria: Secondary | ICD-10-CM | POA: Diagnosis not present

## 2020-10-25 DIAGNOSIS — E119 Type 2 diabetes mellitus without complications: Secondary | ICD-10-CM

## 2020-10-25 DIAGNOSIS — N3000 Acute cystitis without hematuria: Secondary | ICD-10-CM | POA: Insufficient documentation

## 2020-10-25 DIAGNOSIS — E782 Mixed hyperlipidemia: Secondary | ICD-10-CM

## 2020-10-25 DIAGNOSIS — R197 Diarrhea, unspecified: Secondary | ICD-10-CM | POA: Diagnosis not present

## 2020-10-25 DIAGNOSIS — Z13820 Encounter for screening for osteoporosis: Secondary | ICD-10-CM

## 2020-10-25 DIAGNOSIS — B3731 Acute candidiasis of vulva and vagina: Secondary | ICD-10-CM | POA: Insufficient documentation

## 2020-10-25 DIAGNOSIS — F3289 Other specified depressive episodes: Secondary | ICD-10-CM

## 2020-10-25 DIAGNOSIS — Z0000 Encounter for general adult medical examination without abnormal findings: Secondary | ICD-10-CM | POA: Diagnosis not present

## 2020-10-25 DIAGNOSIS — B373 Candidiasis of vulva and vagina: Secondary | ICD-10-CM

## 2020-10-25 LAB — URINALYSIS, ROUTINE W REFLEX MICROSCOPIC
Bilirubin Urine: NEGATIVE
Nitrite: NEGATIVE
Specific Gravity, Urine: 1.01 (ref 1.000–1.030)
Total Protein, Urine: 30 — AB
Urine Glucose: >=1000 — AB
Urobilinogen, UA: 0.2 (ref 0.0–1.0)
pH: 6 (ref 5.0–8.0)

## 2020-10-25 LAB — POC URINALSYSI DIPSTICK (AUTOMATED)
Bilirubin, UA: NEGATIVE
Glucose, UA: POSITIVE — AB
Ketones, UA: NEGATIVE
Leukocytes, UA: NEGATIVE
Nitrite, UA: NEGATIVE
Protein, UA: POSITIVE — AB
Spec Grav, UA: 1.015 (ref 1.010–1.025)
Urobilinogen, UA: 0.2 E.U./dL
pH, UA: 6 (ref 5.0–8.0)

## 2020-10-25 MED ORDER — NITROFURANTOIN MONOHYD MACRO 100 MG PO CAPS
100.0000 mg | ORAL_CAPSULE | Freq: Two times a day (BID) | ORAL | 0 refills | Status: DC
Start: 2020-10-25 — End: 2021-05-21
  Filled 2020-10-25: qty 14, 7d supply, fill #0

## 2020-10-25 MED ORDER — FREESTYLE LANCETS MISC
12 refills | Status: AC
  Filled 2020-10-25: qty 100, 90d supply, fill #0

## 2020-10-25 MED ORDER — FLUCONAZOLE 150 MG PO TABS
ORAL_TABLET | ORAL | 0 refills | Status: DC
  Filled 2020-10-25: qty 2, 2d supply, fill #0

## 2020-10-25 MED ORDER — FREESTYLE LITE TEST VI STRP
ORAL_STRIP | 12 refills | Status: DC
  Filled 2020-10-25: qty 100, 33d supply, fill #0

## 2020-10-25 MED ORDER — GLIMEPIRIDE 2 MG PO TABS
2.0000 mg | ORAL_TABLET | Freq: Every day | ORAL | 3 refills | Status: DC
Start: 2020-10-25 — End: 2021-03-01
  Filled 2020-10-25: qty 30, 30d supply, fill #0
  Filled 2020-11-28: qty 30, 30d supply, fill #1
  Filled 2020-12-22: qty 30, 30d supply, fill #2
  Filled 2021-01-26: qty 30, 30d supply, fill #3

## 2020-10-25 NOTE — Assessment & Plan Note (Signed)
Chronic She did taper herself off the sertraline a few months ago to see if that would influence her diarrhea and it did not change it She does feel depressed and feels that she would benefit from being back on medication Restart sertraline at 50 mg daily after 3 weeks okay to increase back to 100 mg daily

## 2020-10-25 NOTE — Assessment & Plan Note (Signed)
Chronic Intolerant to statins, Zetia Continue Repatha Lipids fairly controlled

## 2020-10-25 NOTE — Assessment & Plan Note (Signed)
Acute Symptoms consistent with probable UTI Urine dip here with questionable UTI Will send urine for urinalysis and urine culture Will empirically start nitrofurantoin 100 mg twice daily x7 days

## 2020-10-25 NOTE — Assessment & Plan Note (Signed)
Chronic Lab Results  Component Value Date   HGBA1C 8.1 (H) 10/21/2020   Not ideally controlled Continue Farxiga 10 mg daily Start glimepiride 2 mg daily before breakfast-feel this can be short-term to help get sugars better controlled and goal is to discontinue it when sugars controlled She will eliminate carbs and most likely this will help She will start checking her sugars at home Increase exercise Consider rechecking A1c in 3 months, follow-up in 6 months

## 2020-10-25 NOTE — Assessment & Plan Note (Signed)
Chronic  Clinically euthyroid TSH normal  continue taking levothyroxine 88 mcg daily

## 2020-10-25 NOTE — Assessment & Plan Note (Signed)
Chronic Started months ago Has seen GI and had colonoscopy, which was normal Has tried several things and the only thing that really worked well was not eating any carbohydrates Will retry probiotics, limiting carbohydrates If no improvement follow back up with GI

## 2020-10-25 NOTE — Addendum Note (Signed)
Addended by: Boris Lown B on: 10/25/2020 02:05 PM   Modules accepted: Orders

## 2020-10-25 NOTE — Assessment & Plan Note (Signed)
Acute Having symptoms of itching and possible UTI symptoms Has evidence of glucose in her sugar which would make yeast infection more likely Diflucan 150 mg x 1 now and repeat after completing antibiotics for UTI

## 2020-10-26 ENCOUNTER — Encounter: Payer: 59 | Admitting: Internal Medicine

## 2020-10-26 LAB — URINE CULTURE: Result:: NO GROWTH

## 2020-10-27 ENCOUNTER — Other Ambulatory Visit (HOSPITAL_COMMUNITY): Payer: Self-pay

## 2020-10-27 ENCOUNTER — Encounter: Payer: Self-pay | Admitting: Internal Medicine

## 2020-10-27 ENCOUNTER — Other Ambulatory Visit: Payer: Self-pay | Admitting: Internal Medicine

## 2020-10-27 DIAGNOSIS — N3001 Acute cystitis with hematuria: Secondary | ICD-10-CM

## 2020-10-27 MED ORDER — LEVOTHYROXINE SODIUM 88 MCG PO TABS
88.0000 ug | ORAL_TABLET | Freq: Every day | ORAL | 1 refills | Status: DC
  Filled 2020-10-27: qty 90, 90d supply, fill #0
  Filled 2021-01-26: qty 90, 90d supply, fill #1

## 2020-10-28 ENCOUNTER — Other Ambulatory Visit (HOSPITAL_COMMUNITY): Payer: Self-pay

## 2020-10-28 ENCOUNTER — Telehealth: Payer: 59 | Admitting: Physician Assistant

## 2020-10-28 DIAGNOSIS — B9689 Other specified bacterial agents as the cause of diseases classified elsewhere: Secondary | ICD-10-CM | POA: Diagnosis not present

## 2020-10-28 DIAGNOSIS — H109 Unspecified conjunctivitis: Secondary | ICD-10-CM | POA: Diagnosis not present

## 2020-10-28 MED ORDER — POLYMYXIN B-TRIMETHOPRIM 10000-0.1 UNIT/ML-% OP SOLN
1.0000 [drp] | OPHTHALMIC | 0 refills | Status: DC
  Filled 2020-10-28: qty 10, 16d supply, fill #0

## 2020-10-28 NOTE — Progress Notes (Signed)
E-Visit for Cheryl Chan   We are sorry that you are not feeling well.  Here is how we plan to help!  Based on what you have shared with me it looks like you have conjunctivitis.  Conjunctivitis is a common inflammatory or infectious condition of the eye that is often referred to as "pink eye".  In most cases it is contagious (viral or bacterial). However, not all conjunctivitis requires antibiotics (ex. Allergic).  We have made appropriate suggestions for you based upon your presentation.  I have prescribed Polytrim Ophthalmic drops 1-2 drops 4 times a day times 5 days  Pink eye can be highly contagious.  It is typically spread through direct contact with secretions, or contaminated objects or surfaces that one may have touched.  Strict handwashing is suggested with soap and water is urged.  If not available, use alcohol based had sanitizer.  Avoid unnecessary touching of the eye.  If you wear contact lenses, you will need to refrain from wearing them until you see no white discharge from the eye for at least 24 hours after being on medication.  You should see symptom improvement in 1-2 days after starting the medication regimen.  Call us if symptoms are not improved in 1-2 days.  Home Care: Wash your hands often! Do not wear your contacts until you complete your treatment plan. Avoid sharing towels, bed linen, personal items with a person who has pink eye. See attention for anyone in your home with similar symptoms.  Get Help Right Away If: Your symptoms do not improve. You develop blurred or loss of vision. Your symptoms worsen (increased discharge, pain or redness)  Your e-visit answers were reviewed by a board certified advanced clinical practitioner to complete your personal care plan.  Depending on the condition, your plan could have included both over the counter or prescription medications.  If there is a problem please reply  once you have received a response from your provider.  Your  safety is important to [place].  If you have drug allergies check your prescription carefully.    You can use MyChart to ask questions about today's visit, request a non-urgent call back, or ask for a work or school excuse for 24 hours related to this e-Visit. If it has been greater than 24 hours you will need to follow up with your provider, or enter a new e-Visit to address those concerns.   You will get an e-mail in the next two days asking about your experience.  I hope that your e-visit has been valuable and will speed your recovery. Thank you for using e-visits.  I provided 5 minutes of non face-to-face time during this encounter for chart review and documentation.

## 2020-11-08 ENCOUNTER — Other Ambulatory Visit (INDEPENDENT_AMBULATORY_CARE_PROVIDER_SITE_OTHER): Payer: 59

## 2020-11-08 DIAGNOSIS — N3001 Acute cystitis with hematuria: Secondary | ICD-10-CM | POA: Diagnosis not present

## 2020-11-08 LAB — URINALYSIS, ROUTINE W REFLEX MICROSCOPIC
Bilirubin Urine: NEGATIVE
Hgb urine dipstick: NEGATIVE
Ketones, ur: NEGATIVE
Leukocytes,Ua: NEGATIVE
Nitrite: NEGATIVE
RBC / HPF: NONE SEEN (ref 0–?)
Specific Gravity, Urine: 1.015 (ref 1.000–1.030)
Total Protein, Urine: NEGATIVE
Urine Glucose: >=1000 — AB
Urobilinogen, UA: 0.2 (ref 0.0–1.0)
pH: 7 (ref 5.0–8.0)

## 2020-11-09 LAB — URINE CULTURE

## 2020-11-11 ENCOUNTER — Other Ambulatory Visit: Payer: Self-pay | Admitting: Internal Medicine

## 2020-11-11 ENCOUNTER — Other Ambulatory Visit (HOSPITAL_COMMUNITY): Payer: Self-pay

## 2020-11-11 DIAGNOSIS — Z1231 Encounter for screening mammogram for malignant neoplasm of breast: Secondary | ICD-10-CM

## 2020-11-11 MED ORDER — CARESTART COVID-19 HOME TEST VI KIT
PACK | 0 refills | Status: DC
  Filled 2020-11-11: qty 4, 4d supply, fill #0

## 2020-11-23 NOTE — Progress Notes (Deleted)
65 y.o. [id number] Married White or Caucasian female here for annual exam.    Patient's last menstrual period was 05/22/2003.          Sexually active: {yes no:314532}  The current method of family planning is {contraception:315051}.    Exercising: {yes no:314532}  {types:19826} Smoker:  {YES NO:22349}  Health Maintenance: Pap:  *** History of abnormal Pap:  {YES NO:22349} MMG:  *** Colonoscopy:  *** BMD:   *** TDaP:  *** Pneumonia vaccine(s):  *** Shingrix:   *** Hep C testing: *** Screening Labs: ***   reports that she has never smoked. She has never used smokeless tobacco. She reports previous alcohol use. She reports that she does not use drugs.  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 ok   Rotator cuff injury    Shingles     Past Surgical History:  Procedure Laterality Date   ABDOMINAL HYSTERECTOMY     CESAREAN SECTION     x's 4   DILATION AND CURETTAGE OF UTERUS     hysteroscopy   FRACTURE SURGERY     nose as a child   OPEN REDUCTION INTERNAL FIXATION (ORIF) DISTAL PHALANX Left 08/05/2012   Procedure: OPEN REDUCTION INTERNAL FIXATION (ORIF) DISTAL PHALANX VERSUS EXTERNAL FIXATION LEFT RING FINGER;  Surgeon: Tennis Must, MD;  Location: [place];  Service: Orthopedics;  Laterality: Left;   ROBOTIC ASSISTED LAP VAGINAL HYSTERECTOMY  12/04/2011   Procedure: ROBOTIC ASSISTED LAPAROSCOPIC VAGINAL HYSTERECTOMY;  Surgeon: Imagene Gurney A. Alycia Rossetti, MD;  Location: [facility];  Service: Gynecology;  Laterality: N/A;   SHOULDER ARTHROSCOPY WITH ROTATOR CUFF REPAIR AND SUBACROMIAL DECOMPRESSION Left 11/27/2013   Procedure: LEFT SHOULDER ARTHROSCOPY, SUBACROMIAL  DECOMPRESSION, PARTIAL ACROMIOPLASTY WITH  CORACOACROMIAL RELEASE, DISTAL CLAVICULECTOMY WITH ROTATOR CUFF REPAIR AND EXTENSIVE DEBRIDEMENT;  Surgeon: Lorn Junes, MD;  Location: [place];  Service: Orthopedics;  Laterality: Left;    Current Outpatient Medications  Medication Sig Dispense Refill   COVID-19 At Home Antigen Test (CARESTART COVID-19 HOME TEST) KIT Use as directed within package instructions. 4 each 0   dapagliflozin propanediol (FARXIGA) 10 MG TABS tablet TAKE 1 TABLET BY MOUTH ONCE DAILY BEFORE BREAKFAST 90 tablet 1   Esomeprazole Magnesium 20 MG TBEC      Evolocumab (REPATHA SURECLICK) 454 MG/ML SOAJ Inject 1 Dose into the skin every 14 (fourteen) days. 2 mL 11   fluconazole (DIFLUCAN) 150 MG tablet Take 1 tablet by mouth x 1 dose,repeat after finishing antibiotics 2 tablet 0   fluticasone (FLONASE) 50 MCG/ACT nasal spray Place 2 sprays into both nostrils daily. 48 g 0   glimepiride (AMARYL) 2 MG tablet Take 1 tablet (2 mg total) by mouth daily before breakfast. 30 tablet 3   glucose blood (FREESTYLE LITE) test strip Test 3 times daily as needed 100 strip 12   Lancets (FREESTYLE) lancets Use as instructed 100 each 12   levothyroxine (SYNTHROID) 88 MCG tablet Take 1 tablet (88 mcg total) by mouth daily before breakfast. 90 tablet 1   loperamide (IMODIUM) 2 MG capsule      nitrofurantoin,  macrocrystal-monohydrate, (MACROBID) 100 MG capsule Take 1 capsule (100 mg total) by mouth 2 (two) times daily. 14 capsule 0   sertraline (ZOLOFT) 100 MG tablet TAKE 1 TABLET (100 MG TOTAL) BY MOUTH DAILY. 90 tablet 3   trimethoprim-polymyxin b (POLYTRIM) ophthalmic solution Place 1-2 drops into both eyes every 4 (four) hours. X 5 days 10 mL 0   Turmeric (QC TUMERIC COMPLEX) 500 MG CAPS Take by mouth.     No current facility-administered medications for this visit.    Family History  Problem Relation Age of Onset   Arthritis Mother    Cancer Father 70       prostrate    Hypertension Father    Stroke Father        in setting of sepsis f/ RMSF   Heart disease Brother 41       cardiomyopathy s/p ICD, on transplant list   Heart failure Brother    Heart failure Other     Review of Systems  Exam:   LMP 05/22/2003      General appearance: alert, cooperative and appears stated age Head: Normocephalic, without obvious abnormality, atraumatic Neck: no adenopathy, supple, symmetrical, trachea midline and thyroid {EXAM; THYROID:18604} Lungs: clear to auscultation bilaterally Breasts: {Exam; breast:13139} Heart: regular rate and rhythm Abdomen: soft, non-tender; bowel sounds normal; no masses,  no organomegaly Extremities: extremities normal, atraumatic, no cyanosis or edema Skin: Skin color, texture, turgor normal. No rashes or lesions Lymph nodes: Cervical, supraclavicular, and axillary nodes normal. No abnormal inguinal nodes palpated Neurologic: Grossly normal   Pelvic: External genitalia:  no lesions              Urethra:  normal appearing urethra with no masses, tenderness or lesions              Bartholins and Skenes: normal                 Vagina: normal appearing vagina with normal color and no discharge, no lesions              Cervix: {exam; cervix:14595}              Pap taken: {yes no:314532} Bimanual Exam:  Uterus:  {exam; uterus:12215}              Adnexa: {exam; adnexa:12223}               Rectovaginal: Confirms               Anus:  normal sphincter tone, no lesions  Chaperone, ***, CMA, was present for exam.  Assessment/Plan:

## 2020-11-24 ENCOUNTER — Ambulatory Visit (INDEPENDENT_AMBULATORY_CARE_PROVIDER_SITE_OTHER): Payer: 59 | Admitting: Obstetrics & Gynecology

## 2020-11-24 ENCOUNTER — Other Ambulatory Visit: Payer: Self-pay

## 2020-11-24 ENCOUNTER — Encounter (HOSPITAL_BASED_OUTPATIENT_CLINIC_OR_DEPARTMENT_OTHER): Payer: Self-pay | Admitting: Obstetrics & Gynecology

## 2020-11-24 VITALS — BP 147/68 | HR 77 | Ht 62.5 in | Wt 147.6 lb

## 2020-11-24 DIAGNOSIS — R140 Abdominal distension (gaseous): Secondary | ICD-10-CM | POA: Diagnosis not present

## 2020-11-24 DIAGNOSIS — E119 Type 2 diabetes mellitus without complications: Secondary | ICD-10-CM

## 2020-11-24 DIAGNOSIS — E039 Hypothyroidism, unspecified: Secondary | ICD-10-CM | POA: Diagnosis not present

## 2020-11-24 DIAGNOSIS — Z8542 Personal history of malignant neoplasm of other parts of uterus: Secondary | ICD-10-CM

## 2020-11-24 DIAGNOSIS — E041 Nontoxic single thyroid nodule: Secondary | ICD-10-CM

## 2020-11-24 DIAGNOSIS — Z01419 Encounter for gynecological examination (general) (routine) without abnormal findings: Secondary | ICD-10-CM

## 2020-11-24 NOTE — Progress Notes (Signed)
65 y.o. Cheryl Chan Married White or Caucasian female here for annual exam.  Doing well.  Retired during [employer].  Helping with grandchildren.    Blood was in June with Dr. Quay Burow.  Has diabetes and is now on amaryl.    Having GI issues.  Followed by Dr. Cristina Gong.  Just did one in February.  Having diarrhea.  Colonoscopy was normal.  Just frustrated with how she feels.  Denies vaginal bleeding.    Patient's last menstrual period was 05/22/2003.          Sexually active: Yes.    The current method of family planning is post menopausal status.    Exercising: No.  Smoker:  no  Health Maintenance: Pap:  05/05/2019 Negative History of abnormal Pap:  h/o endometrial cancer MMG:  10/12/2020 Negative Colonoscopy:  06/2020, follow up 10 years BMD:   07/28/2013 TDaP:  2014 Pneumonia vaccine(s):  2014 Shingrix:   2018 Hep C testing: 2016 Screening Labs: done with Dr. Quay Burow   reports that she has never smoked. She has never used smokeless tobacco. She reports previous alcohol use. She reports that she does not use drugs.  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"   Diet-controlled type 2 diabetes mellitus ([place])    Endometrial cancer ([place]) 10/2011 dx   s/p vag hyst 11/2011   History of chicken pox    Hyperlipidemia    Hypothyroidism    Positive TB test    CXRs ok   Rotator cuff injury    Shingles     Past Surgical History:  Procedure Laterality Date   ABDOMINAL HYSTERECTOMY     CESAREAN SECTION     x's 4   DILATION AND CURETTAGE OF UTERUS     hysteroscopy   FRACTURE SURGERY     nose as a child   OPEN REDUCTION INTERNAL FIXATION (ORIF) DISTAL PHALANX Left 08/05/2012   Procedure: OPEN REDUCTION INTERNAL FIXATION (ORIF) DISTAL PHALANX VERSUS EXTERNAL FIXATION LEFT RING FINGER;  Surgeon: Tennis Must, MD;  Location: [facility];  Service: Orthopedics;   Laterality: Left;   ROBOTIC ASSISTED LAP VAGINAL HYSTERECTOMY  12/04/2011   Procedure: ROBOTIC ASSISTED LAPAROSCOPIC VAGINAL HYSTERECTOMY;  Surgeon: Imagene Gurney A. Alycia Rossetti, MD;  Location: [facility];  Service: Gynecology;  Laterality: N/A;   SHOULDER ARTHROSCOPY WITH ROTATOR CUFF REPAIR AND SUBACROMIAL DECOMPRESSION Left 11/27/2013   Procedure: LEFT SHOULDER ARTHROSCOPY, SUBACROMIAL DECOMPRESSION, PARTIAL ACROMIOPLASTY WITH  CORACOACROMIAL RELEASE, DISTAL CLAVICULECTOMY WITH ROTATOR CUFF REPAIR AND EXTENSIVE DEBRIDEMENT;  Surgeon: Lorn Junes, MD;  Location: [facility];  Service: Orthopedics;  Laterality: Left;    Current Outpatient Medications  Medication Sig Dispense Refill   COVID-19 At Home Antigen Test (CARESTART COVID-19 HOME TEST) KIT Use as directed within package instructions. 4 each 0   dapagliflozin propanediol (FARXIGA) 10 MG TABS tablet TAKE 1 TABLET BY MOUTH ONCE DAILY BEFORE BREAKFAST 90 tablet 1   Esomeprazole Magnesium 20 MG TBEC      Evolocumab (REPATHA SURECLICK) 564 MG/ML SOAJ Inject 1 Dose into the skin every 14 (fourteen) days. 2 mL 11   fluticasone (FLONASE) 50 MCG/ACT nasal spray Place 2 sprays into both nostrils daily. 48 g 0   glimepiride (AMARYL) 2 MG tablet Take 1 tablet (2 mg total) by mouth daily before breakfast. 30 tablet 3   glucose blood (FREESTYLE LITE) test strip Test  3 times daily as needed 100 strip 12   Lancets (FREESTYLE) lancets Use as instructed 100 each 12   levothyroxine (SYNTHROID) 88 MCG tablet Take 1 tablet (88 mcg total) by mouth daily before breakfast. 90 tablet 1   loperamide (IMODIUM) 2 MG capsule      sertraline (ZOLOFT) 100 MG tablet TAKE 1 TABLET (100 MG TOTAL) BY MOUTH DAILY. 90 tablet 3   Turmeric 500 MG CAPS Take by mouth.     fluconazole (DIFLUCAN) 150 MG tablet Take 1 tablet by mouth x 1 dose,repeat after finishing antibiotics (Patient not taking: Reported on 11/24/2020) 2 tablet 0   nitrofurantoin, macrocrystal-monohydrate,  (MACROBID) 100 MG capsule Take 1 capsule (100 mg total) by mouth 2 (two) times daily. (Patient not taking: Reported on 11/24/2020) 14 capsule 0   trimethoprim-polymyxin b (POLYTRIM) ophthalmic solution Place 1-2 drops into both eyes every 4 (four) hours. X 5 days (Patient not taking: Reported on 11/24/2020) 10 mL 0   No current facility-administered medications for this visit.    Family History  Problem Relation Age of Onset   Arthritis Mother    Cancer Father 59       prostrate   Hypertension Father    Stroke Father        in setting of sepsis f/ RMSF   Heart disease Brother 71       cardiomyopathy s/p ICD, on transplant list   Heart failure Brother    Heart failure Other     Review of Systems  All other systems reviewed and are negative.  Exam:   BP (!) 147/68 (BP Location: Right Arm, Patient Position: Sitting, Cuff Size: Small)   Pulse 77   Ht 5' 2.5" (1.588 m)   Wt 147 lb 9.6 oz (67 kg)   LMP 05/22/2003   BMI 26.57 kg/m   Height: 5' 2.5" (158.8 cm)  General appearance: alert, cooperative and appears stated age Head: Normocephalic, without obvious abnormality, atraumatic Neck: no adenopathy, supple, symmetrical, trachea midline and thyroid normal to inspection and palpation Lungs: clear to auscultation bilaterally Breasts: normal appearance, no masses or tenderness Heart: regular rate and rhythm Abdomen: soft, non-tender; bowel sounds normal; no masses,  no organomegaly Extremities: extremities normal, atraumatic, no cyanosis or edema Skin: Skin color, texture, turgor normal. No rashes or lesions Lymph nodes: Cervical, supraclavicular, and axillary nodes normal. No abnormal inguinal nodes palpated Neurologic: Grossly normal   Pelvic: External genitalia:  no lesions              Urethra:  normal appearing urethra with no masses, tenderness or lesions              Bartholins and Skenes: normal                 Vagina: normal appearing vagina with normal color and no  discharge, nodularity at vaginal cuff              Cervix: absent              Pap taken: No. Bimanual Exam:  Uterus:  uterus absent              Adnexa: normal adnexa and no mass, fullness, tenderness               Rectovaginal: Confirms               Anus:  normal sphincter tone, no lesions  Chaperone, Octaviano Batty, CMA, was present for exam.  Assessment/Plan:  1. Well woman exam with routine gynecological exam - pap smear not indicated.  Guidelines reviewed. - MMG 10/12/2020 - colonoscopy 2022, follow up 10 years - BMD 07/28/2013 - vaccines updated - lab work done with Dr. Quay Burow  2. History of uterine cancer -nodularity of cuff.  With recent GI issues, will proceed with CT abdomen and pelvis with contrast (called radiology and confirmed correct order)  3. Acquired hypothyroidism  4. Type 2 diabetes mellitus without complication, without long-term current use of insulin ([place])  5.  PMP - no HRT

## 2020-11-25 ENCOUNTER — Other Ambulatory Visit (HOSPITAL_COMMUNITY): Payer: Self-pay

## 2020-11-28 ENCOUNTER — Other Ambulatory Visit (HOSPITAL_COMMUNITY): Payer: Self-pay

## 2020-11-28 NOTE — Addendum Note (Signed)
Addended by: Blenda Nicely on: 11/28/2020 09:34 AM   Modules accepted: Orders

## 2020-11-28 NOTE — Addendum Note (Signed)
Addended by: Blenda Nicely on: 11/28/2020 09:31 AM   Modules accepted: Orders

## 2020-11-29 ENCOUNTER — Other Ambulatory Visit (HOSPITAL_COMMUNITY): Payer: Self-pay

## 2020-12-01 ENCOUNTER — Ambulatory Visit (HOSPITAL_COMMUNITY)
Admission: RE | Admit: 2020-12-01 | Discharge: 2020-12-01 | Disposition: A | Discharge status: Home / Self Care | Payer: 59 | Source: Ambulatory Visit | Attending: Obstetrics & Gynecology | Admitting: Obstetrics & Gynecology

## 2020-12-01 ENCOUNTER — Other Ambulatory Visit: Payer: Self-pay

## 2020-12-01 DIAGNOSIS — Z8542 Personal history of malignant neoplasm of other parts of uterus: Secondary | ICD-10-CM | POA: Diagnosis not present

## 2020-12-01 DIAGNOSIS — C539 Malignant neoplasm of cervix uteri, unspecified: Secondary | ICD-10-CM | POA: Diagnosis not present

## 2020-12-01 DIAGNOSIS — Z90710 Acquired absence of both cervix and uterus: Secondary | ICD-10-CM | POA: Diagnosis not present

## 2020-12-01 DIAGNOSIS — R140 Abdominal distension (gaseous): Secondary | ICD-10-CM | POA: Diagnosis not present

## 2020-12-01 LAB — POCT I-STAT CREATININE: Creatinine, Ser: 0.6 mg/dL (ref 0.44–1.00)

## 2020-12-01 MED ORDER — IOHEXOL 350 MG/ML SOLN
80.0000 mL | Freq: Once | INTRAVENOUS | Status: AC | PRN
  Administered 2020-12-01: 80 mL via INTRAVENOUS

## 2020-12-01 MED ORDER — SODIUM CHLORIDE (PF) 0.9 % IJ SOLN
INTRAMUSCULAR | Status: AC
  Filled 2020-12-01: qty 50

## 2020-12-02 ENCOUNTER — Encounter (HOSPITAL_BASED_OUTPATIENT_CLINIC_OR_DEPARTMENT_OTHER): Payer: Self-pay

## 2020-12-02 ENCOUNTER — Other Ambulatory Visit (HOSPITAL_COMMUNITY): Payer: Self-pay

## 2020-12-12 ENCOUNTER — Other Ambulatory Visit: Payer: 59

## 2020-12-15 ENCOUNTER — Other Ambulatory Visit (HOSPITAL_COMMUNITY): Payer: Self-pay

## 2020-12-16 ENCOUNTER — Other Ambulatory Visit (HOSPITAL_COMMUNITY): Payer: Self-pay

## 2020-12-20 ENCOUNTER — Other Ambulatory Visit (HOSPITAL_COMMUNITY): Payer: Self-pay

## 2020-12-20 ENCOUNTER — Other Ambulatory Visit: Payer: Self-pay | Admitting: Internal Medicine

## 2020-12-20 MED ORDER — FREESTYLE LIBRE 14 DAY SENSOR MISC
3 refills | Status: DC
  Filled 2020-12-20: qty 6, 84d supply, fill #0
  Filled 2020-12-21: qty 2, 28d supply, fill #0
  Filled 2021-01-17 (×2): qty 2, 28d supply, fill #1
  Filled 2021-05-17: qty 2, 28d supply, fill #2
  Filled 2021-06-14 – 2021-06-15 (×2): qty 2, 28d supply, fill #3

## 2020-12-20 MED ORDER — FREESTYLE LIBRE 14 DAY SENSOR MISC
0 refills | Status: DC
  Filled 2020-12-20: qty 1, 14d supply, fill #0

## 2020-12-20 MED ORDER — FREESTYLE LIBRE READER DEVI
1.0000 | 5 refills | Status: DC
  Filled 2020-12-20: qty 14, fill #0

## 2020-12-20 MED ORDER — FREESTYLE LIBRE READER DEVI
1.0000 | 0 refills | Status: DC
  Filled 2020-12-20: qty 1, 1d supply, fill #0

## 2020-12-21 ENCOUNTER — Other Ambulatory Visit (HOSPITAL_COMMUNITY): Payer: Self-pay

## 2020-12-21 ENCOUNTER — Other Ambulatory Visit: Payer: Self-pay | Admitting: Internal Medicine

## 2020-12-21 MED ORDER — SERTRALINE HCL 100 MG PO TABS
100.0000 mg | ORAL_TABLET | Freq: Every day | ORAL | 3 refills | Status: DC
  Filled 2020-12-21: qty 90, 90d supply, fill #0
  Filled 2021-03-27: qty 90, 90d supply, fill #1
  Filled 2021-06-26: qty 90, 90d supply, fill #2
  Filled 2021-09-22: qty 90, 90d supply, fill #3

## 2020-12-21 MED ORDER — DAPAGLIFLOZIN PROPANEDIOL 10 MG PO TABS
ORAL_TABLET | Freq: Every day | ORAL | 1 refills | Status: DC
  Filled 2020-12-21: qty 90, 90d supply, fill #0
  Filled 2021-03-01 – 2021-03-20 (×2): qty 90, 90d supply, fill #1

## 2020-12-22 ENCOUNTER — Other Ambulatory Visit (HOSPITAL_COMMUNITY): Payer: Self-pay

## 2021-01-17 ENCOUNTER — Other Ambulatory Visit (HOSPITAL_COMMUNITY): Payer: Self-pay

## 2021-01-26 ENCOUNTER — Other Ambulatory Visit (HOSPITAL_COMMUNITY): Payer: Self-pay

## 2021-02-13 ENCOUNTER — Other Ambulatory Visit: Payer: Self-pay

## 2021-02-13 ENCOUNTER — Other Ambulatory Visit (HOSPITAL_BASED_OUTPATIENT_CLINIC_OR_DEPARTMENT_OTHER): Payer: Self-pay

## 2021-02-13 ENCOUNTER — Ambulatory Visit: Payer: 59 | Attending: Internal Medicine

## 2021-02-13 DIAGNOSIS — Z23 Encounter for immunization: Secondary | ICD-10-CM

## 2021-02-13 MED ORDER — INFLUENZA VAC SPLIT QUAD 0.5 ML IM SUSY
PREFILLED_SYRINGE | INTRAMUSCULAR | 0 refills | Status: DC
  Filled 2021-02-13: qty 0.5, 1d supply, fill #0

## 2021-02-13 MED ORDER — PFIZER COVID-19 VAC BIVALENT 30 MCG/0.3ML IM SUSP
INTRAMUSCULAR | 0 refills | Status: DC
  Filled 2021-02-13: qty 0.3, 1d supply, fill #0

## 2021-02-14 NOTE — Progress Notes (Signed)
   Covid-19 Vaccination Clinic  Name:  Cheryl Chan    MRN: [id number] DOB: 1955-11-18  02/14/2021  Cheryl Chan was observed post Covid-19 immunization for 15 minutes without incident. She was provided with Vaccine Information Sheet and instruction to access the V-Safe system.   Cheryl Chan was instructed to call 911 with any severe reactions post vaccine: Difficulty breathing  Swelling of face and throat  A fast heartbeat  A bad rash all over body  Dizziness and weakness

## 2021-02-15 ENCOUNTER — Ambulatory Visit (INDEPENDENT_AMBULATORY_CARE_PROVIDER_SITE_OTHER): Payer: 59 | Admitting: Family Medicine

## 2021-02-15 VITALS — Ht 62.0 in | Wt 145.0 lb

## 2021-02-15 DIAGNOSIS — M25511 Pain in right shoulder: Secondary | ICD-10-CM

## 2021-02-15 MED ORDER — METHYLPREDNISOLONE ACETATE 40 MG/ML IJ SUSP
40.0000 mg | Freq: Once | INTRAMUSCULAR | Status: AC
  Administered 2021-02-15: 40 mg via INTRA_ARTICULAR

## 2021-02-15 NOTE — Progress Notes (Signed)
PCP: Binnie Rail, MD  Subjective:   HPI: Patient is a 65 y.o. female here for right shoulder pain.  12/03/18: Pt is here c/o chronic R shoulder pain. Pain started 1 month ago, seeking care today because it was starting to wake her up from sleep. No MOI or new trauma, seen almost 1 year ago for similar symptoms and received subacromial CSI at that time that offered relief for 6 months in her shoulder and in her hand. Aggravating factors include reaching arm overhead. Alleviating factors include rest. No radiating symptoms. Describes the pain as dull and rates pain 7/10.   06/15/19: Patient returns with recurring pain in lateral right shoulder. Pain worse lateral shoulder with reaching. Has been doing a lot of painting at home, gardening in her free time. Gets radiation into hand. History of rotator cuff repair remotely. No numbness.  02/15/21: Patient reports right shoulder started bothering her again. She has been painting a lot at home, may have exacerbated this. Worse at night and with overhead motions. Has trouble lifting above head with any weight. Feels pain all the time now. No numbness, tingling.  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"   Diet-controlled type 2 diabetes mellitus ([place])    Endometrial cancer ([place]) 10/2011 dx   s/p vag hyst 11/2011   History of chicken pox    Hyperlipidemia    Hypothyroidism    Positive TB test    CXRs ok   Rotator cuff injury    Shingles     Current Outpatient Medications on File Prior to Visit  Medication Sig Dispense Refill   Continuous Blood Gluc Receiver (FREESTYLE LIBRE READER) DEVI Use to check blood sugar 4 times daily. 1 each 0   Continuous Blood Gluc Sensor (FREESTYLE LIBRE 14 DAY SENSOR) MISC Use as directed to check sugars 4 times daily. 6 each 3   COVID-19 At Home Antigen Test (CARESTART COVID-19 HOME TEST)  KIT Use as directed within package instructions. 4 each 0   COVID-19 mRNA bivalent vaccine, Pfizer, (PFIZER COVID-19 VAC BIVALENT) injection Inject into the muscle. 0.3 mL 0   dapagliflozin propanediol (FARXIGA) 10 MG TABS tablet TAKE 1 TABLET BY MOUTH ONCE DAILY BEFORE BREAKFAST 90 tablet 1   Esomeprazole Magnesium 20 MG TBEC      Evolocumab (REPATHA SURECLICK) 932 MG/ML SOAJ Inject 1 Dose into the skin every 14 (fourteen) days. 2 mL 11   fluconazole (DIFLUCAN) 150 MG tablet Take 1 tablet by mouth x 1 dose,repeat after finishing antibiotics (Patient not taking: Reported on 11/24/2020) 2 tablet 0   fluticasone (FLONASE) 50 MCG/ACT nasal spray Place 2 sprays into both nostrils daily. 48 g 0   glimepiride (AMARYL) 2 MG tablet Take 1 tablet (2 mg total) by mouth daily before breakfast. 30 tablet 3   glucose blood (FREESTYLE LITE) test strip Test 3 times daily as needed 100 strip 12   influenza vac split quadrivalent PF (FLUARIX) 0.5 ML injection Inject into the muscle. 0.5 mL 0   Lancets (FREESTYLE) lancets Use as instructed 100 each 12   levothyroxine (SYNTHROID) 88 MCG tablet Take 1 tablet (88 mcg total) by mouth daily before breakfast. 90 tablet 1   loperamide (IMODIUM) 2 MG capsule      nitrofurantoin, macrocrystal-monohydrate, (MACROBID) 100 MG capsule Take 1 capsule (100 mg total) by mouth 2 (two) times daily. (Patient  not taking: Reported on 11/24/2020) 14 capsule 0   sertraline (ZOLOFT) 100 MG tablet Take 1 tablet (100 mg total) by mouth daily. 90 tablet 3   trimethoprim-polymyxin b (POLYTRIM) ophthalmic solution Place 1-2 drops into both eyes every 4 (four) hours. X 5 days (Patient not taking: Reported on 11/24/2020) 10 mL 0   Turmeric 500 MG CAPS Take by mouth.     No current facility-administered medications on file prior to visit.    Past Surgical History:  Procedure Laterality Date   ABDOMINAL HYSTERECTOMY     CESAREAN SECTION     x's 4   DILATION AND CURETTAGE OF UTERUS      hysteroscopy   FRACTURE SURGERY     nose as a child   OPEN REDUCTION INTERNAL FIXATION (ORIF) DISTAL PHALANX Left 08/05/2012   Procedure: OPEN REDUCTION INTERNAL FIXATION (ORIF) DISTAL PHALANX VERSUS EXTERNAL FIXATION LEFT RING FINGER;  Surgeon: Tennis Must, MD;  Location: [facility];  Service: Orthopedics;  Laterality: Left;   ROBOTIC ASSISTED LAP VAGINAL HYSTERECTOMY  12/04/2011   Procedure: ROBOTIC ASSISTED LAPAROSCOPIC VAGINAL HYSTERECTOMY;  Surgeon: Imagene Gurney A. Alycia Rossetti, MD;  Location: [facility];  Service: Gynecology;  Laterality: N/A;   SHOULDER ARTHROSCOPY WITH ROTATOR CUFF REPAIR AND SUBACROMIAL DECOMPRESSION Left 11/27/2013   Procedure: LEFT SHOULDER ARTHROSCOPY, SUBACROMIAL DECOMPRESSION, PARTIAL ACROMIOPLASTY WITH  CORACOACROMIAL RELEASE, DISTAL CLAVICULECTOMY WITH ROTATOR CUFF REPAIR AND EXTENSIVE DEBRIDEMENT;  Surgeon: Lorn Junes, MD;  Location: [facility];  Service: Orthopedics;  Laterality: Left;    Allergies  Allergen Reactions   Crestor [Rosuvastatin Calcium]     Muscle ache   Demerol [Meperidine]    Statins    Trulicity [Dulaglutide]     Nausea, diarrhea   Victoza [Liraglutide]     Nausea, diarrhea   Metformin And Related     diarrhea    Social History   Socioeconomic History   Marital status: Married    Spouse name: Not on file   Number of children: Not on file   Years of education: Not on file   Highest education level: Not on file  Occupational History   Not on file  Tobacco Use   Smoking status: Never   Smokeless tobacco: Never  Substance and Sexual Activity   Alcohol use: Not Currently   Drug use: No   Sexual activity: Yes    Partners: Male    Birth control/protection: Surgical    Comment: hysterectomy  Other Topics Concern   Not on file  Social History Narrative   Married, lives with spouse   MS degree in speech path -    works at [employer] since 1990s, prior [place], 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       Walking the dog for exercise   Social Determinants of Health   Financial Resource Strain: Not on file  Food Insecurity: Not on file  Transportation Needs: Not on file  Physical Activity: Not on file  Stress: Not on file  Social Connections: Not on file  Intimate Partner Violence: Not on file    Family History  Problem Relation Age of Onset   Arthritis Mother    Cancer Father 53       prostrate   Hypertension Father    Stroke Father        in setting of sepsis f/ RMSF   Heart disease Brother 73       cardiomyopathy s/p ICD, on transplant list   Heart failure Brother  Heart failure Other     Ht 5' 2" (1.575 m)   Wt 145 lb (65.8 kg)   LMP 05/22/2003   BMI 26.52 kg/m   Review of Systems: See HPI above.     Objective:  Physical Exam:  Gen: NAD, comfortable in exam room  Right shoulder: No swelling, ecchymoses.  No gross deformity. No TTP AC joint, biceps tendon. FROM. Positive Hawkins, negative Neers. Negative Yergasons. Strength 5/5 with empty can and resisted internal/external rotation.  Mild pain with resisted external rotation. Negative apprehension, sulcus. NV intact distally.  Limited MSK u/s:  Infraspinatus and subscapularis intact.  She does have a partial width full thickness tear of anterior portion of supraspinatus - about 1.2cm of retraction.  Assessment & Plan:  1. Right shoulder pain - chronic, pain 2/2 impingement.  Her limited ultrasound today does show a full thickness tear of part of her supraspinatus but her strength on testing is excellent and no pain so she has compensated very well for this.  Exam consistent with impingement and infraspinatus tendinopathy.  Will start home exercise program.  Subacromial injection given today.  Aleve or ibuprofen if needed.  After informed written consent timeout was performed, patient was seated in chair in exam room. Right shoulder was prepped with alcohol swab and utilizing lateral approach with ultrasound  guidance, patient's right subacromial space was injected with 3:1 lidocaine: depomedrol. Patient tolerated the procedure well without immediate complications.

## 2021-02-15 NOTE — Patient Instructions (Signed)
You have rotator cuff impingement Try to avoid painful activities (overhead activities, lifting with extended arm) as much as possible. Aleve 2 tabs twice a day with food OR ibuprofen 3 tabs three times a day with food for pain and inflammation if needed. Can take tylenol in addition to this. Subacromial injection may be beneficial to help with pain and to decrease inflammation - you were given this today. Consider physical therapy. Do home exercise program with theraband and scapular stabilization exercises daily 3 sets of 10 once a day. Follow up with me in 6 weeks.

## 2021-03-01 ENCOUNTER — Other Ambulatory Visit (HOSPITAL_COMMUNITY): Payer: Self-pay

## 2021-03-01 ENCOUNTER — Other Ambulatory Visit: Payer: Self-pay | Admitting: Internal Medicine

## 2021-03-01 MED FILL — Glimepiride Tab 2 MG: ORAL | 30 days supply | Qty: 30 | Fill #0 | Status: AC

## 2021-03-02 ENCOUNTER — Other Ambulatory Visit (HOSPITAL_COMMUNITY): Payer: Self-pay

## 2021-03-20 ENCOUNTER — Other Ambulatory Visit (HOSPITAL_COMMUNITY): Payer: Self-pay

## 2021-03-23 ENCOUNTER — Other Ambulatory Visit (HOSPITAL_COMMUNITY): Payer: Self-pay

## 2021-03-23 MED ORDER — CARESTART COVID-19 HOME TEST VI KIT
PACK | 0 refills | Status: DC
  Filled 2021-03-23: qty 4, 4d supply, fill #0

## 2021-03-27 ENCOUNTER — Other Ambulatory Visit (HOSPITAL_COMMUNITY): Payer: Self-pay

## 2021-03-27 MED FILL — Glimepiride Tab 2 MG: ORAL | 90 days supply | Qty: 90 | Fill #1 | Status: AC

## 2021-03-28 ENCOUNTER — Other Ambulatory Visit (HOSPITAL_COMMUNITY): Payer: Self-pay

## 2021-03-29 ENCOUNTER — Ambulatory Visit: Payer: 59 | Admitting: Family Medicine

## 2021-04-17 DIAGNOSIS — Z760 Encounter for issue of repeat prescription: Secondary | ICD-10-CM | POA: Diagnosis not present

## 2021-04-18 DIAGNOSIS — H25813 Combined forms of age-related cataract, bilateral: Secondary | ICD-10-CM | POA: Diagnosis not present

## 2021-04-18 DIAGNOSIS — H5213 Myopia, bilateral: Secondary | ICD-10-CM | POA: Diagnosis not present

## 2021-04-18 DIAGNOSIS — H524 Presbyopia: Secondary | ICD-10-CM | POA: Diagnosis not present

## 2021-04-18 DIAGNOSIS — H52223 Regular astigmatism, bilateral: Secondary | ICD-10-CM | POA: Diagnosis not present

## 2021-04-18 DIAGNOSIS — E119 Type 2 diabetes mellitus without complications: Secondary | ICD-10-CM | POA: Diagnosis not present

## 2021-05-02 ENCOUNTER — Other Ambulatory Visit: Payer: Self-pay | Admitting: Internal Medicine

## 2021-05-02 ENCOUNTER — Ambulatory Visit: Payer: 59 | Admitting: Internal Medicine

## 2021-05-02 ENCOUNTER — Other Ambulatory Visit (HOSPITAL_COMMUNITY): Payer: Self-pay

## 2021-05-02 MED ORDER — DAPAGLIFLOZIN PROPANEDIOL 10 MG PO TABS
ORAL_TABLET | Freq: Every day | ORAL | 1 refills | Status: DC
  Filled 2021-05-02 – 2021-06-26 (×2): qty 90, 90d supply, fill #0
  Filled 2021-09-22: qty 90, 90d supply, fill #1

## 2021-05-02 MED ORDER — LEVOTHYROXINE SODIUM 88 MCG PO TABS
88.0000 ug | ORAL_TABLET | Freq: Every day | ORAL | 1 refills | Status: DC
  Filled 2021-05-02: qty 90, 90d supply, fill #0
  Filled 2021-08-03: qty 90, 90d supply, fill #1

## 2021-05-17 ENCOUNTER — Other Ambulatory Visit (HOSPITAL_COMMUNITY): Payer: Self-pay

## 2021-05-21 ENCOUNTER — Telehealth: Payer: Self-pay | Admitting: Emergency Medicine

## 2021-05-21 DIAGNOSIS — J208 Acute bronchitis due to other specified organisms: Secondary | ICD-10-CM

## 2021-05-21 MED ORDER — PREDNISONE 10 MG (21) PO TBPK: ORAL_TABLET | Freq: Every day | ORAL | 0 refills | Status: DC

## 2021-05-21 MED ORDER — BENZONATATE 100 MG PO CAPS: 100.0000 mg | ORAL_CAPSULE | Freq: Two times a day (BID) | ORAL | 0 refills | Status: DC | PRN

## 2021-05-21 NOTE — Progress Notes (Signed)
I have spent 5 minutes in review of e-visit questionnaire, review and updating patient chart, medical decision making and response to patient.   Cyara Devoto, PA-C    

## 2021-05-21 NOTE — Progress Notes (Signed)
We are sorry that you are not feeling well.  Here is how we plan to help!  Based on your presentation I believe you most likely have A cough due to a virus.  This is called viral bronchitis and is best treated by rest, plenty of fluids and control of the cough.  You may use Ibuprofen or Tylenol as directed to help your symptoms.     In addition you may use A prescription cough medication called Tessalon Perles 100mg . You may take 1-2 capsules every 8 hours as needed for your cough.  Prednisone 10 mg daily for 6 days (see taper instructions below)  Directions for 6 day taper: Day 1: 2 tablets before breakfast, 1 after both lunch & dinner and 2 at bedtime Day 2: 1 tab before breakfast, 1 after both lunch & dinner and 2 at bedtime Day 3: 1 tab at each meal & 1 at bedtime Day 4: 1 tab at breakfast, 1 at lunch, 1 at bedtime Day 5: 1 tab at breakfast & 1 tab at bedtime Day 6: 1 tab at breakfast  From your responses in the eVisit questionnaire you describe inflammation in the upper respiratory tract which is causing a significant cough.  This is commonly called Bronchitis and has four common causes:   Allergies Viral Infections Acid Reflux Bacterial Infection Allergies, viruses and acid reflux are treated by controlling symptoms or eliminating the cause. An example might be a cough caused by taking certain blood pressure medications. You stop the cough by changing the medication. Another example might be a cough caused by acid reflux. Controlling the reflux helps control the cough.  USE OF BRONCHODILATOR ("RESCUE") INHALERS: There is a risk from using your bronchodilator too frequently.  The risk is that over-reliance on a medication which only relaxes the muscles surrounding the breathing tubes can reduce the effectiveness of medications prescribed to reduce swelling and congestion of the tubes themselves.  Although you feel brief relief from the bronchodilator inhaler, your asthma may actually be  worsening with the tubes becoming more swollen and filled with mucus.  This can delay other crucial treatments, such as oral steroid medications. If you need to use a bronchodilator inhaler daily, several times per day, you should discuss this with your provider.  There are probably better treatments that could be used to keep your asthma under control.     HOME CARE Only take medications as instructed by your medical team. Complete the entire course of an antibiotic. Drink plenty of fluids and get plenty of rest. Avoid close contacts especially the very young and the elderly Cover your mouth if you cough or cough into your sleeve. Always remember to wash your hands A steam or ultrasonic humidifier can help congestion.   GET HELP RIGHT AWAY IF: You develop worsening fever. You become short of breath You cough up blood. Your symptoms persist after you have completed your treatment plan MAKE SURE YOU  Understand these instructions. Will watch your condition. Will get help right away if you are not doing well or get worse.    Thank you for choosing an e-visit.  Your e-visit answers were reviewed by a board certified advanced clinical practitioner to complete your personal care plan. Depending upon the condition, your plan could have included both over the counter or prescription medications.  Please review your pharmacy choice. Make sure the pharmacy is open so you can pick up prescription now. If there is a problem, you may contact your provider  through [employer] and have the prescription routed to another pharmacy.  Your safety is important to [place]. If you have drug allergies check your prescription carefully.   For the next 24 hours you can use MyChart to ask questions about today's visit, request a non-urgent call back, or ask for a work or school excuse. You will get an email in the next two days asking about your experience. I hope that your e-visit has been valuable and will  speed your recovery.

## 2021-05-23 ENCOUNTER — Encounter: Payer: Self-pay | Admitting: Internal Medicine

## 2021-05-23 NOTE — Progress Notes (Signed)
Subjective:    Patient ID: Cheryl Chan, female    DOB: 05/08/1956, 66 y.o.   MRN: [id number]  This visit occurred during the SARS-CoV-2 public health emergency.  Safety protocols were in place, including screening questions prior to the visit, additional usage of staff PPE, and extensive cleaning of exam room while observing appropriate contact time as indicated for disinfecting solutions.     HPI The patient is here for follow up of their chronic medical problems, including DM, hld, hypothyroid, depression  She is walking the dog.  Her sugars are not ideally controlled.  She has not been eating as well as she should.  She needs to lose weight.    Medications and allergies reviewed with patient and updated if appropriate.  Patient Active Problem List   Diagnosis Date Noted   Diarrhea 10/25/2020   Acute cystitis 10/25/2020   Thyroid nodule 12/24/2018   Osteoarthritis 04/22/2017   Onychomycosis of toenail 09/25/2016   Overweight 01/25/2015   Rotator cuff injury    History of uterine cancer 11/12/2011   Non-insulin dependent type 2 diabetes mellitus (HCC)    Hypothyroidism    Hyperlipidemia    Depression     Current Outpatient Medications on File Prior to Visit  Medication Sig Dispense Refill   benzonatate (TESSALON) 100 MG capsule Take 1 capsule (100 mg total) by mouth 2 (two) times daily as needed for cough. 20 capsule 0   Continuous Blood Gluc Receiver (FREESTYLE LIBRE READER) DEVI Use to check blood sugar 4 times daily. 1 each 0   Continuous Blood Gluc Sensor (FREESTYLE LIBRE 14 DAY SENSOR) MISC Use as directed to check sugars 4 times daily. 6 each 3   dapagliflozin propanediol (FARXIGA) 10 MG TABS tablet TAKE 1 TABLET BY MOUTH ONCE DAILY BEFORE BREAKFAST 90 tablet 1   Esomeprazole Magnesium 20 MG TBEC      Evolocumab (REPATHA SURECLICK) 425 MG/ML SOAJ Inject 1 Dose into the skin every 14 (fourteen) days. 2 mL 11   fluticasone (FLONASE) 50 MCG/ACT nasal spray Place 2  sprays into both nostrils daily. 48 g 0   glimepiride (AMARYL) 2 MG tablet Take 1 tablet (2 mg total) by mouth daily before breakfast. 30 tablet 3   glucose blood (FREESTYLE LITE) test strip Test 3 times daily as needed 100 strip 12   Lancets (FREESTYLE) lancets Use as instructed 100 each 12   levothyroxine (SYNTHROID) 88 MCG tablet Take 1 tablet (88 mcg total) by mouth daily before breakfast. 90 tablet 1   loperamide (IMODIUM) 2 MG capsule      predniSONE (STERAPRED UNI-PAK 21 TAB) 10 MG (21) TBPK tablet Take by mouth daily. Take 6 tabs by mouth daily  for 2 days, then 5 tabs for 2 days, then 4 tabs for 2 days, then 3 tabs for 2 days, 2 tabs for 2 days, then 1 tab by mouth daily for 2 days 42 tablet 0   sertraline (ZOLOFT) 100 MG tablet Take 1 tablet (100 mg total) by mouth daily. 90 tablet 3   Turmeric 500 MG CAPS Take by mouth.     No current facility-administered medications on file prior to visit.    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"   Diet-controlled type 2 diabetes mellitus ([place])    Endometrial cancer ([place]) 10/2011 dx   s/p vag  hyst 11/2011   History of chicken pox    Hyperlipidemia    Hypothyroidism    Positive TB test    CXRs ok   Rotator cuff injury    Shingles     Past Surgical History:  Procedure Laterality Date   ABDOMINAL HYSTERECTOMY     CESAREAN SECTION     x's 4   DILATION AND CURETTAGE OF UTERUS     hysteroscopy   FRACTURE SURGERY     nose as a child   OPEN REDUCTION INTERNAL FIXATION (ORIF) DISTAL PHALANX Left 08/05/2012   Procedure: OPEN REDUCTION INTERNAL FIXATION (ORIF) DISTAL PHALANX VERSUS EXTERNAL FIXATION LEFT RING FINGER;  Surgeon: Tennis Must, MD;  Location: [facility];  Service: Orthopedics;  Laterality: Left;   ROBOTIC ASSISTED LAP VAGINAL HYSTERECTOMY  12/04/2011   Procedure: ROBOTIC ASSISTED LAPAROSCOPIC  VAGINAL HYSTERECTOMY;  Surgeon: Imagene Gurney A. Alycia Rossetti, MD;  Location: [facility];  Service: Gynecology;  Laterality: N/A;   SHOULDER ARTHROSCOPY WITH ROTATOR CUFF REPAIR AND SUBACROMIAL DECOMPRESSION Left 11/27/2013   Procedure: LEFT SHOULDER ARTHROSCOPY, SUBACROMIAL DECOMPRESSION, PARTIAL ACROMIOPLASTY WITH  CORACOACROMIAL RELEASE, DISTAL CLAVICULECTOMY WITH ROTATOR CUFF REPAIR AND EXTENSIVE DEBRIDEMENT;  Surgeon: Lorn Junes, MD;  Location: [facility];  Service: Orthopedics;  Laterality: Left;    Social History   Socioeconomic History   Marital status: Married    Spouse name: Not on file   Number of children: Not on file   Years of education: Not on file   Highest education level: Not on file  Occupational History   Not on file  Tobacco Use   Smoking status: Never   Smokeless tobacco: Never  Substance and Sexual Activity   Alcohol use: Not Currently   Drug use: No   Sexual activity: Yes    Partners: Male    Birth control/protection: Surgical    Comment: hysterectomy  Other Topics Concern   Not on file  Social History Narrative   Married, lives with spouse   MS degree in speech path -    works at [employer] since 1990s, prior [place], 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      Walking the dog for exercise   Social Determinants of Health   Financial Resource Strain: Not on file  Food Insecurity: Not on file  Transportation Needs: Not on file  Physical Activity: Not on file  Stress: Not on file  Social Connections: Not on file    Family History  Problem Relation Age of Onset   Arthritis Mother    Cancer Father 79       prostrate   Hypertension Father    Stroke Father        in setting of sepsis f/ RMSF   Heart disease Brother 22       cardiomyopathy s/p ICD, on transplant list   Heart failure Brother    Heart failure Other     Review of Systems  Constitutional:  Positive for fatigue. Negative for fever.  HENT:  Positive for hearing loss  (intermittent r ear) and sinus pain.   Respiratory:  Positive for cough (worse in morning). Negative for shortness of breath and wheezing.   Cardiovascular:  Negative for chest pain, palpitations and leg swelling.  Neurological:  Positive for headaches (sinus related). Negative for dizziness and light-headedness.      Objective:   Vitals:   05/24/21 1314  BP: 128/68  Pulse: 88  Temp: 98 F (36.7 C)  SpO2: 97%  BP Readings from Last 3 Encounters:  05/24/21 128/68  11/24/20 (!) 147/68  10/25/20 112/80   Wt Readings from Last 3 Encounters:  05/24/21 155 lb 6.4 oz (70.5 kg)  02/15/21 145 lb (65.8 kg)  11/24/20 147 lb 9.6 oz (67 kg)   Body mass index is 28.42 kg/m.   Physical Exam    Constitutional: Appears well-developed and well-nourished. No distress.  HENT:  Head: Normocephalic and atraumatic.  Neck: Neck supple. No tracheal deviation present. No thyromegaly present.  No cervical lymphadenopathy Ears: left ear canal with moderate nonobstructing cerumen - TM not visualized. Right ear canal with obstructing cerumen Cardiovascular: Normal rate, regular rhythm and normal heart sounds.   No murmur heard. No carotid bruit .  No edema Pulmonary/Chest: Effort normal and breath sounds normal. No respiratory distress. No has no wheezes. No rales.  Skin: Skin is warm and dry. Not diaphoretic.  Psychiatric: Normal mood and affect. Behavior is normal.      Assessment & Plan:    See Problem List for Assessment and Plan of chronic medical problems.

## 2021-05-23 NOTE — Patient Instructions (Addendum)
°  Blood work was ordered.     Medications changes include :   Mounjaro 2.5 mg weekly  Your prescription(s) have been submitted to your pharmacy. Please take as directed and contact our office if you believe you are having problem(s) with the medication(s).   Please followup in 6 months

## 2021-05-24 ENCOUNTER — Ambulatory Visit: Payer: 59 | Admitting: Internal Medicine

## 2021-05-24 ENCOUNTER — Other Ambulatory Visit (HOSPITAL_COMMUNITY): Payer: Self-pay

## 2021-05-24 ENCOUNTER — Other Ambulatory Visit: Payer: Self-pay

## 2021-05-24 VITALS — BP 128/68 | HR 88 | Temp 98.0°F | Ht 62.0 in | Wt 155.4 lb

## 2021-05-24 DIAGNOSIS — E782 Mixed hyperlipidemia: Secondary | ICD-10-CM

## 2021-05-24 DIAGNOSIS — H6121 Impacted cerumen, right ear: Secondary | ICD-10-CM

## 2021-05-24 DIAGNOSIS — E119 Type 2 diabetes mellitus without complications: Secondary | ICD-10-CM

## 2021-05-24 DIAGNOSIS — E039 Hypothyroidism, unspecified: Secondary | ICD-10-CM

## 2021-05-24 DIAGNOSIS — F3289 Other specified depressive episodes: Secondary | ICD-10-CM

## 2021-05-24 MED ORDER — TIRZEPATIDE 2.5 MG/0.5ML ~~LOC~~ SOAJ
2.5000 mg | SUBCUTANEOUS | 0 refills | Status: DC
  Filled 2021-05-24 – 2021-05-25 (×2): qty 2, 28d supply, fill #0

## 2021-05-24 NOTE — Assessment & Plan Note (Signed)
Chronic  Clinically euthyroid Currently taking levothyroxine 88 mcg Check tsh  Titrate med dose if needed

## 2021-05-24 NOTE — Assessment & Plan Note (Signed)
Chronic Controlled, stable Continue  sertraline 100 mg daily 

## 2021-05-24 NOTE — Assessment & Plan Note (Addendum)
Chronic Lab Results  Component Value Date   HGBA1C 8.1 (H) 10/21/2020   Not ideally controlled Continue farxiga 10 mg daily Continue glimepiride 2 mg daily Start mounjaro 2.5 mg weekly a1c today

## 2021-05-24 NOTE — Assessment & Plan Note (Signed)
Chronic Check lipid panel  Continue repatha q 14 days Regular exercise and healthy diet encouraged

## 2021-05-24 NOTE — Assessment & Plan Note (Signed)
Acute Intermittent decreased hearing Ear lavage done today which she tolerated relief

## 2021-05-25 ENCOUNTER — Other Ambulatory Visit (HOSPITAL_COMMUNITY): Payer: Self-pay

## 2021-05-25 LAB — MICROALBUMIN / CREATININE URINE RATIO
Creatinine,U: 104.5 mg/dL
Microalb Creat Ratio: 0.9 mg/g (ref 0.0–30.0)
Microalb, Ur: 1 mg/dL (ref 0.0–1.9)

## 2021-05-25 LAB — COMPREHENSIVE METABOLIC PANEL
ALT: 15 U/L (ref 0–35)
AST: 15 U/L (ref 0–37)
Albumin: 4.1 g/dL (ref 3.5–5.2)
Alkaline Phosphatase: 71 U/L (ref 39–117)
BUN: 24 mg/dL — ABNORMAL HIGH (ref 6–23)
CO2: 28 mEq/L (ref 19–32)
Calcium: 9 mg/dL (ref 8.4–10.5)
Chloride: 102 mEq/L (ref 96–112)
Creatinine, Ser: 0.69 mg/dL (ref 0.40–1.20)
GFR: 91.33 mL/min (ref >60.00)
Glucose, Bld: 150 mg/dL — ABNORMAL HIGH (ref 70–99)
Potassium: 4.1 mEq/L (ref 3.5–5.1)
Sodium: 137 mEq/L (ref 135–145)
Total Bilirubin: 0.5 mg/dL (ref 0.2–1.2)
Total Protein: 7.4 g/dL (ref 6.0–8.3)

## 2021-05-25 LAB — LIPID PANEL
Cholesterol: 196 mg/dL (ref 0–200)
HDL: 66.4 mg/dL (ref >39.00)
LDL Cholesterol: 109 mg/dL — ABNORMAL HIGH (ref 0–99)
NonHDL: 130.04
Total CHOL/HDL Ratio: 3
Triglycerides: 104 mg/dL (ref 0.0–149.0)
VLDL: 20.8 mg/dL (ref 0.0–40.0)

## 2021-05-25 LAB — TSH: TSH: 3.89 u[IU]/mL (ref 0.35–5.50)

## 2021-05-25 LAB — HEMOGLOBIN A1C: Hgb A1c MFr Bld: 7.1 % — ABNORMAL HIGH (ref 4.6–6.5)

## 2021-05-29 ENCOUNTER — Other Ambulatory Visit (HOSPITAL_COMMUNITY): Payer: Self-pay

## 2021-06-05 ENCOUNTER — Other Ambulatory Visit: Payer: Self-pay | Admitting: Internal Medicine

## 2021-06-05 ENCOUNTER — Other Ambulatory Visit (HOSPITAL_COMMUNITY): Payer: Self-pay

## 2021-06-07 ENCOUNTER — Other Ambulatory Visit (HOSPITAL_COMMUNITY): Payer: Self-pay

## 2021-06-07 ENCOUNTER — Other Ambulatory Visit: Payer: Self-pay | Admitting: Internal Medicine

## 2021-06-07 MED ORDER — FREESTYLE LIBRE READER DEVI
1.0000 | 0 refills | Status: DC
  Filled 2021-06-07: qty 1, fill #0

## 2021-06-08 ENCOUNTER — Other Ambulatory Visit (HOSPITAL_COMMUNITY): Payer: Self-pay

## 2021-06-12 ENCOUNTER — Other Ambulatory Visit (HOSPITAL_COMMUNITY): Payer: Self-pay

## 2021-06-12 ENCOUNTER — Other Ambulatory Visit: Payer: Self-pay | Admitting: Internal Medicine

## 2021-06-13 ENCOUNTER — Other Ambulatory Visit (HOSPITAL_COMMUNITY): Payer: Self-pay

## 2021-06-13 MED ORDER — MOUNJARO 2.5 MG/0.5ML ~~LOC~~ SOAJ
2.5000 mg | SUBCUTANEOUS | 0 refills | Status: DC
  Filled 2021-06-13 – 2021-06-16 (×2): qty 2, 28d supply, fill #0

## 2021-06-14 ENCOUNTER — Other Ambulatory Visit (HOSPITAL_COMMUNITY): Payer: Self-pay

## 2021-06-15 ENCOUNTER — Other Ambulatory Visit (HOSPITAL_COMMUNITY): Payer: Self-pay

## 2021-06-16 ENCOUNTER — Other Ambulatory Visit (HOSPITAL_COMMUNITY): Payer: Self-pay

## 2021-06-16 ENCOUNTER — Encounter: Payer: Self-pay | Admitting: Internal Medicine

## 2021-06-26 ENCOUNTER — Other Ambulatory Visit (HOSPITAL_COMMUNITY): Payer: Self-pay

## 2021-06-27 ENCOUNTER — Other Ambulatory Visit: Payer: Self-pay

## 2021-06-27 ENCOUNTER — Other Ambulatory Visit (HOSPITAL_COMMUNITY): Payer: Self-pay

## 2021-06-27 ENCOUNTER — Ambulatory Visit (INDEPENDENT_AMBULATORY_CARE_PROVIDER_SITE_OTHER): Payer: 59 | Admitting: Internal Medicine

## 2021-06-27 ENCOUNTER — Encounter (HOSPITAL_BASED_OUTPATIENT_CLINIC_OR_DEPARTMENT_OTHER): Payer: Self-pay | Admitting: Internal Medicine

## 2021-06-27 VITALS — BP 126/68 | HR 86 | Ht 62.0 in | Wt 145.4 lb

## 2021-06-27 DIAGNOSIS — E7801 Familial hypercholesterolemia: Secondary | ICD-10-CM

## 2021-06-27 DIAGNOSIS — Z8249 Family history of ischemic heart disease and other diseases of the circulatory system: Secondary | ICD-10-CM

## 2021-06-27 DIAGNOSIS — I2510 Atherosclerotic heart disease of native coronary artery without angina pectoris: Secondary | ICD-10-CM

## 2021-06-27 DIAGNOSIS — E119 Type 2 diabetes mellitus without complications: Secondary | ICD-10-CM

## 2021-06-27 NOTE — Progress Notes (Signed)
LIPID CLINIC CONSULT NOTE  Chief Complaint:  Manage dyslipidemia  Primary Care Physician: Binnie Rail, MD  Primary Cardiologist:  None  HPI:  Cheryl Chan is a 66 y.o. female who is being seen today for the evaluation of dyslipidemia at the request of Burns, Claudina Lick, MD.  This is a pleasant 66 year old female who is referred for evaluation management of dyslipidemia.  She reports a longstanding history of high cholesterol and has been statin intolerant for a number of years.  Despite significant exercise and dietary changes in the past she is not been able to get adequate control of her cholesterol.  She also reports that her mother had high cholesterol and that there is heart disease in her family.  Her brother was diagnosed in 2006 with a nonischemic cardiomyopathy ultimately died.  She underwent familial genetic testing with Dr. Broadus John and was referred to Dr. Ronna Polio in our heart failure clinic to see if she possibly could be at risk for ARVC.  Ultimately she had a cardiac MRI in February 2020 which showed normal biventricular function.  He had a stress echo in 2011 at the direction of Dr. Bea Laura which was negative for ischemia.  Her husband is a Software engineer at the [employer].  She had several questions regarding her elevated cholesterol.  Incidentally her most recent lipid showed total cholesterol 285, triglycerides 220, HDL 55 and direct LDL 195.  Her LDL has been well over 200 in the past, suggestive of a familial hyperlipidemia.  Surprisingly, she has not had prior cardiovascular events that were aware of.  She currently denies any coronary symptoms.  02/19/2020  This is for is seen today in follow-up.  Overall she is doing well with Repatha.  She denies any significant side effects with that.  She has had an excellent response to Repatha with marked improvement in her cholesterol.  Total cholesterol is now 170, HDL 61, LDL 87 and triglycerides 126, improved  from a direct LDL of 195.  06/27/2021  Cheryl Chan returns today for routine follow-up.  Overall she seems to be doing well.  She is tolerating Repatha.  Her cholesterol has come down significantly although I would argue remains above target is a diabetic with some ASCVD (imaging evidence of atherosclerotic calcification of the aorta).  Her most recent lipid profile showed total cholesterol of 196, HDL 66, triglycerides 104 and LDL 109.  We discussed the possibility of adding ezetimibe however she felt that she was not necessarily 100% compliant with the medication and or diet but is improving and has lost 10 pounds recently.  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"   Diet-controlled type 2 diabetes mellitus ([place])    Endometrial cancer ([place]) 10/2011 dx   s/p vag hyst 11/2011   History of chicken pox    Hyperlipidemia    Hypothyroidism    Positive TB test    CXRs ok   Rotator cuff injury    Shingles     Past Surgical History:  Procedure Laterality Date   ABDOMINAL HYSTERECTOMY     CESAREAN SECTION     x's 4   DILATION AND CURETTAGE OF UTERUS     hysteroscopy   FRACTURE SURGERY     nose as a child   OPEN REDUCTION INTERNAL FIXATION (ORIF) DISTAL PHALANX Left  08/05/2012   Procedure: OPEN REDUCTION INTERNAL FIXATION (ORIF) DISTAL PHALANX VERSUS EXTERNAL FIXATION LEFT RING FINGER;  Surgeon: Tennis Must, MD;  Location: [facility];  Service: Orthopedics;  Laterality: Left;   ROBOTIC ASSISTED LAP VAGINAL HYSTERECTOMY  12/04/2011   Procedure: ROBOTIC ASSISTED LAPAROSCOPIC VAGINAL HYSTERECTOMY;  Surgeon: Imagene Gurney A. Alycia Rossetti, MD;  Location: [facility];  Service: Gynecology;  Laterality: N/A;   SHOULDER ARTHROSCOPY WITH ROTATOR CUFF REPAIR AND SUBACROMIAL DECOMPRESSION Left 11/27/2013   Procedure: LEFT SHOULDER ARTHROSCOPY, SUBACROMIAL DECOMPRESSION, PARTIAL  ACROMIOPLASTY WITH  CORACOACROMIAL RELEASE, DISTAL CLAVICULECTOMY WITH ROTATOR CUFF REPAIR AND EXTENSIVE DEBRIDEMENT;  Surgeon: Lorn Junes, MD;  Location: [facility];  Service: Orthopedics;  Laterality: Left;    FAMHx:  Family History  Problem Relation Age of Onset   Arthritis Mother    Cancer Father 33       prostrate   Hypertension Father    Stroke Father        in setting of sepsis f/ RMSF   Heart disease Brother 109       cardiomyopathy s/p ICD, on transplant list   Heart failure Brother    Heart failure Other     SOCHx:   reports that she has never smoked. She has never used smokeless tobacco. She reports that she does not currently use alcohol. She reports that she does not use drugs.  ALLERGIES:  Allergies  Allergen Reactions   Crestor [Rosuvastatin Calcium]     Muscle ache   Demerol [Meperidine]    Statins    Trulicity [Dulaglutide]     Nausea, diarrhea   Victoza [Liraglutide]     Nausea, diarrhea   Metformin And Related     diarrhea    ROS: Pertinent items noted in HPI and remainder of comprehensive ROS otherwise negative.  HOME MEDS: Current Outpatient Medications on File Prior to Visit  Medication Sig Dispense Refill   Continuous Blood Gluc Receiver (FREESTYLE LIBRE READER) DEVI Use to check blood sugar 4 times daily. 1 each 0   Continuous Blood Gluc Sensor (FREESTYLE LIBRE 14 DAY SENSOR) MISC Use as directed to check sugars 4 times daily. 6 each 3   dapagliflozin propanediol (FARXIGA) 10 MG TABS tablet TAKE 1 TABLET BY MOUTH ONCE DAILY BEFORE BREAKFAST 90 tablet 1   Esomeprazole Magnesium 20 MG TBEC      Evolocumab (REPATHA SURECLICK) 465 MG/ML SOAJ Inject 1 Dose into the skin every 14 (fourteen) days. 2 mL 11   fluticasone (FLONASE) 50 MCG/ACT nasal spray Place 2 sprays into both nostrils daily. 48 g 0   glucose blood (FREESTYLE LITE) test strip Test 3 times daily as needed 100 strip 12   Lancets (FREESTYLE) lancets Use as instructed  100 each 12   levothyroxine (SYNTHROID) 88 MCG tablet Take 1 tablet (88 mcg total) by mouth daily before breakfast. 90 tablet 1   loperamide (IMODIUM) 2 MG capsule      sertraline (ZOLOFT) 100 MG tablet Take 1 tablet (100 mg total) by mouth daily. 90 tablet 3   tirzepatide (MOUNJARO) 2.5 MG/0.5ML Pen Inject 2.5 mg into the skin once a week. 2 mL 0   Turmeric 500 MG CAPS Take by mouth.     benzonatate (TESSALON) 100 MG capsule Take 1 capsule (100 mg total) by mouth 2 (two) times daily as needed for cough. (Patient not taking: Reported on 06/27/2021) 20 capsule 0   glimepiride (AMARYL) 2 MG tablet Take 1 tablet (2 mg total) by mouth  daily before breakfast. (Patient not taking: Reported on 06/27/2021) 30 tablet 3   predniSONE (STERAPRED UNI-PAK 21 TAB) 10 MG (21) TBPK tablet Take by mouth daily. Take 6 tabs by mouth daily  for 2 days, then 5 tabs for 2 days, then 4 tabs for 2 days, then 3 tabs for 2 days, 2 tabs for 2 days, then 1 tab by mouth daily for 2 days (Patient not taking: Reported on 06/27/2021) 42 tablet 0   No current facility-administered medications on file prior to visit.    LABS/IMAGING: No results found for this or any previous visit (from the past 48 hour(s)). No results found.  LIPID PANEL:    Component Value Date/Time   CHOL 196 05/25/2021 0748   CHOL 170 11/24/2019 0923   TRIG 104.0 05/25/2021 0748   HDL 66.40 05/25/2021 0748   HDL 61 11/24/2019 0923   CHOLHDL 3 05/25/2021 0748   VLDL 20.8 05/25/2021 0748   LDLCALC 109 (H) 05/25/2021 0748   LDLCALC 87 11/24/2019 0923   LDLDIRECT 195.0 06/12/2019 0849    WEIGHTS: Wt Readings from Last 3 Encounters:  06/27/21 145 lb 6.4 oz (66 kg)  05/24/21 155 lb 6.4 oz (70.5 kg)  02/15/21 145 lb (65.8 kg)    VITALS: BP 126/68    Pulse 86    Ht 5\' 2"  (1.575 m)    Wt 145 lb 6.4 oz (66 kg)    LMP 05/22/2003    SpO2 98%    BMI 26.59 kg/m   EXAM: Deferred  EKG: Deferred  ASSESSMENT: Possible familial hyperlipidemia- Baruch Merl Criteria Statin intolerance - myalgias, goal LDL less than 70 Type 2 diabetes. Atherosclerotic aortic calcification  PLAN: 1.   Cheryl Chan continues to have had a significant improvement with Repatha although LDL remains above target less than 70.  We discussed the possibility of adding ezetimibe to her regimen however she wants to continue to work with diet and weight loss.  She has had some success recently with that.  She will plan to have her lipids reassessed this summer with her PCP and follow-up with me annually or sooner as necessary.  Pixie Casino, MD, [facility], [place] Director of the Advanced Lipid Disorders &  Cardiovascular Risk Reduction Clinic Diplomate of the American Board of Clinical Lipidology Attending Cardiologist  Direct Dial: [phone]   Fax: [phone]  Website:  [email or web address]  Nadean Corwin Halona Amstutz 06/27/2021, 8:14 AM

## 2021-06-27 NOTE — Patient Instructions (Signed)
Medication Instructions:  Your physician recommends that you continue on your current medications as directed. Please refer to the Current Medication list given to you today.  *If you need a refill on your cardiac medications before your next appointment, please call your pharmacy*   Lab Work: FASTING lab work in 1 year -- complete before your next visit with Dr. Debara Pickett   If you have labs (blood work) drawn today and your tests are completely normal, you will receive your results only by: [place] (if you have MyChart) OR A paper copy in the mail If you have any lab test that is abnormal or we need to change your treatment, we will call you to review the results.   Testing/Procedures: NONE   Follow-Up: At [facility], you and your health needs are our priority.  As part of our continuing mission to provide you with exceptional heart care, we have created designated Provider Care Teams.  These Care Teams include your primary Cardiologist (physician) and Advanced Practice Providers (APPs -  Physician Assistants and Nurse Practitioners) who all work together to provide you with the care you need, when you need it.  We recommend signing up for the patient portal called "MyChart".  Sign up information is provided on this After Visit Summary.  MyChart is used to connect with patients for Virtual Visits (Telemedicine).  Patients are able to view lab/test results, encounter notes, upcoming appointments, etc.  Non-urgent messages can be sent to your provider as well.   To learn more about what you can do with MyChart, go to [email or web address].    Your next appointment:   1 year with Dr. Debara Pickett -- lipid clinic

## 2021-07-13 ENCOUNTER — Telehealth: Payer: Self-pay | Admitting: Internal Medicine

## 2021-07-13 NOTE — Telephone Encounter (Signed)
Repatha PA request received in CMM Attempted to submit  Message received: Member has an active PA on file which is expiring on 07/26/2021 and has 5 no. of fills remaining.

## 2021-07-21 ENCOUNTER — Other Ambulatory Visit (HOSPITAL_COMMUNITY): Payer: Self-pay

## 2021-07-21 ENCOUNTER — Other Ambulatory Visit: Payer: Self-pay | Admitting: Internal Medicine

## 2021-07-21 MED ORDER — MOUNJARO 2.5 MG/0.5ML ~~LOC~~ SOAJ
2.5000 mg | SUBCUTANEOUS | 0 refills | Status: DC
  Filled 2021-07-21: qty 2, 28d supply, fill #0

## 2021-08-04 ENCOUNTER — Other Ambulatory Visit (HOSPITAL_COMMUNITY): Payer: Self-pay

## 2021-08-07 ENCOUNTER — Encounter: Payer: Self-pay | Admitting: Internal Medicine

## 2021-08-08 ENCOUNTER — Other Ambulatory Visit (HOSPITAL_COMMUNITY): Payer: Self-pay

## 2021-08-08 MED ORDER — TIRZEPATIDE 5 MG/0.5ML ~~LOC~~ SOAJ
5.0000 mg | SUBCUTANEOUS | 0 refills | Status: DC
  Filled 2021-08-08: qty 2, 28d supply, fill #0

## 2021-08-15 ENCOUNTER — Other Ambulatory Visit (HOSPITAL_COMMUNITY): Payer: Self-pay

## 2021-08-17 ENCOUNTER — Other Ambulatory Visit (HOSPITAL_COMMUNITY): Payer: Self-pay

## 2021-08-17 MED ORDER — CARESTART COVID-19 HOME TEST VI KIT
PACK | 0 refills | Status: DC
  Filled 2021-08-17: qty 4, 8d supply, fill #0

## 2021-08-18 ENCOUNTER — Other Ambulatory Visit (HOSPITAL_COMMUNITY): Payer: Self-pay

## 2021-08-28 ENCOUNTER — Other Ambulatory Visit (HOSPITAL_COMMUNITY): Payer: Self-pay

## 2021-08-28 ENCOUNTER — Telehealth: Payer: No Typology Code available for payment source | Admitting: Physician Assistant

## 2021-08-28 DIAGNOSIS — J020 Streptococcal pharyngitis: Secondary | ICD-10-CM

## 2021-08-28 MED ORDER — AMOXICILLIN 500 MG PO CAPS
500.0000 mg | ORAL_CAPSULE | Freq: Two times a day (BID) | ORAL | 0 refills | Status: DC
  Filled 2021-08-28: qty 20, 10d supply, fill #0

## 2021-08-28 NOTE — Progress Notes (Signed)

## 2021-09-08 ENCOUNTER — Other Ambulatory Visit: Payer: Self-pay | Admitting: Internal Medicine

## 2021-09-08 ENCOUNTER — Other Ambulatory Visit (HOSPITAL_COMMUNITY): Payer: Self-pay

## 2021-09-08 MED ORDER — MOUNJARO 5 MG/0.5ML ~~LOC~~ SOAJ
5.0000 mg | SUBCUTANEOUS | 2 refills | Status: DC
  Filled 2021-09-08: qty 6, 84d supply, fill #0

## 2021-09-22 ENCOUNTER — Other Ambulatory Visit (HOSPITAL_COMMUNITY): Payer: Self-pay

## 2021-09-26 ENCOUNTER — Other Ambulatory Visit (HOSPITAL_COMMUNITY): Payer: Self-pay

## 2021-09-26 ENCOUNTER — Other Ambulatory Visit: Payer: Self-pay | Admitting: Internal Medicine

## 2021-09-27 ENCOUNTER — Other Ambulatory Visit (HOSPITAL_COMMUNITY): Payer: Self-pay

## 2021-09-27 IMAGING — MG MM DIGITAL DIAGNOSTIC UNILAT*L* W/ TOMO W/ CAD
8 series · 8 of 20 positions shown · non-contrast
Comparison: Previous exams.

CLINICAL DATA: 63-year-old female with reported new left nipple
inversion.

EXAM:
DIGITAL DIAGNOSTIC UNILATERAL LEFT MAMMOGRAM WITH TOMO AND CAD;
ULTRASOUND LEFT BREAST LIMITED

[L CC]
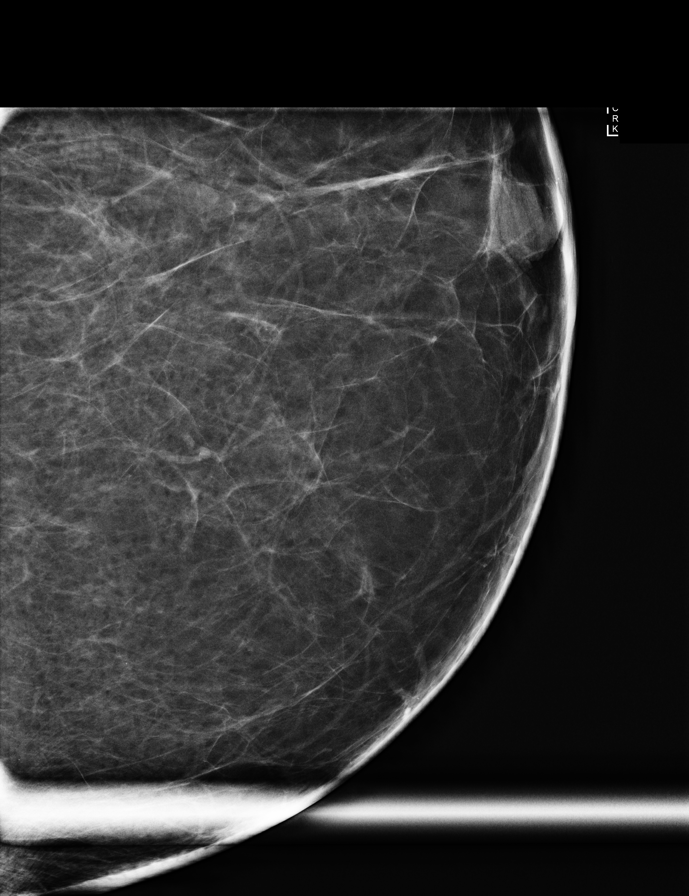

[L ML]
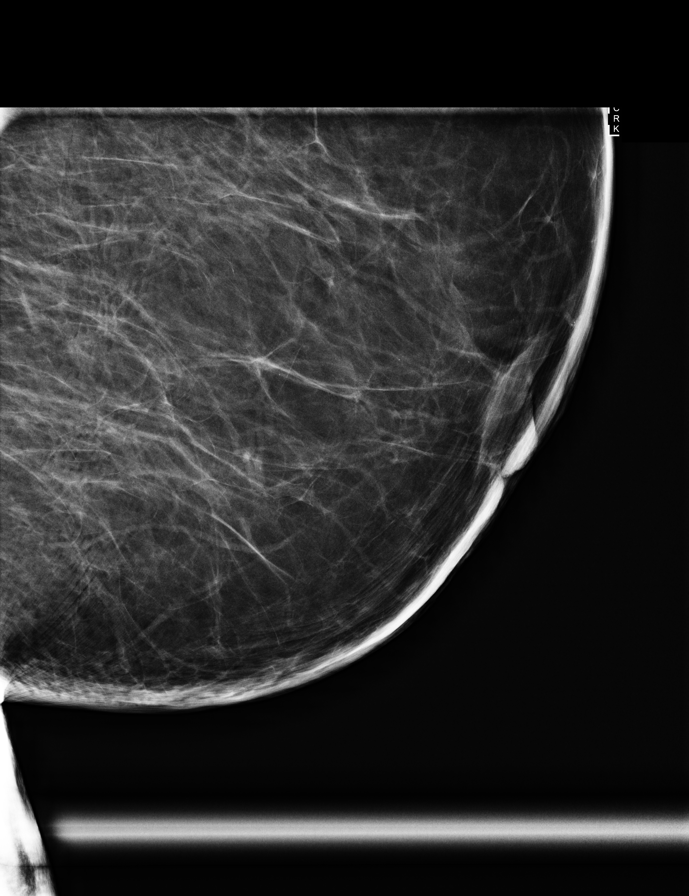

[L CC synth-2D]
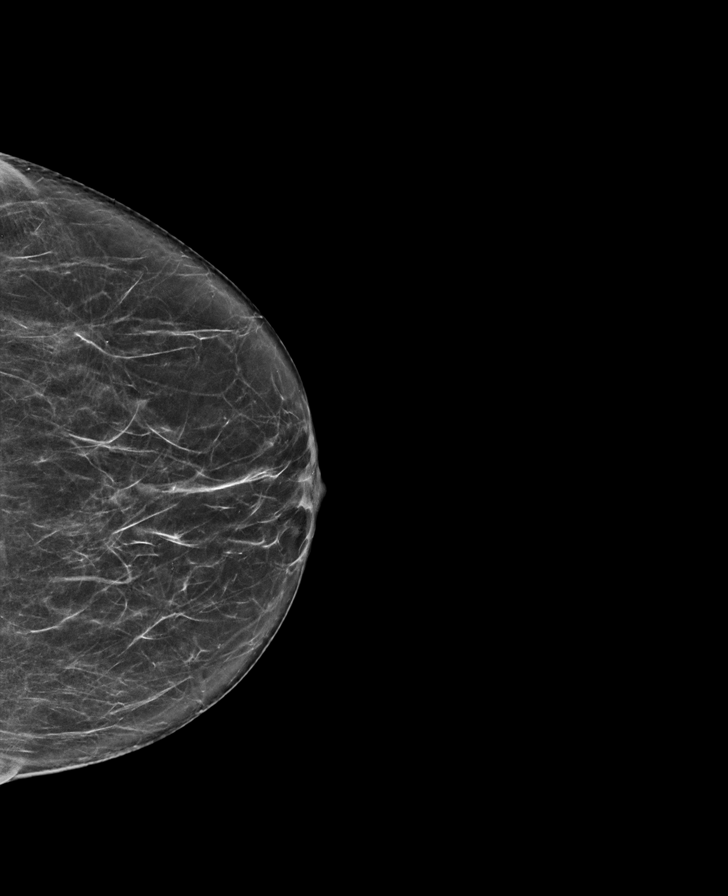

[L MLO synth-2D (1 of 2)]
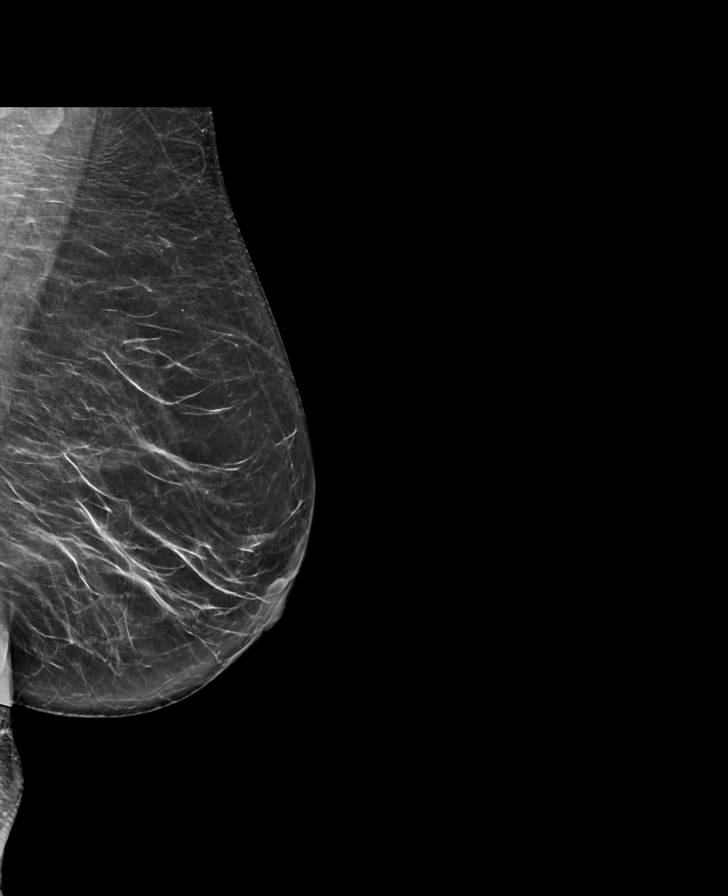

[L MLO synth-2D (2 of 2)]
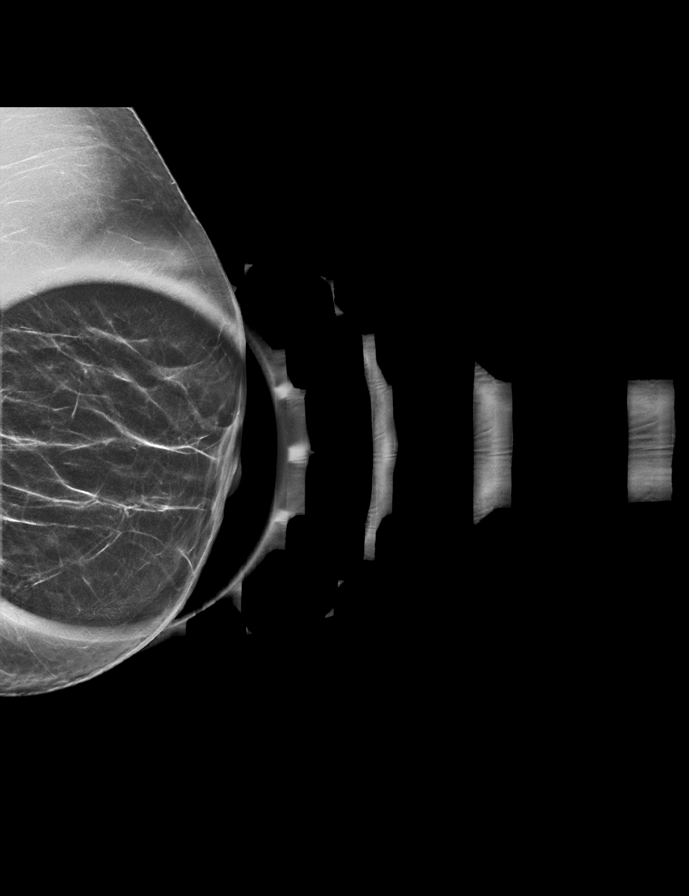

[L MLO tomo (1 of 2) · tomo slice 23/45.0]
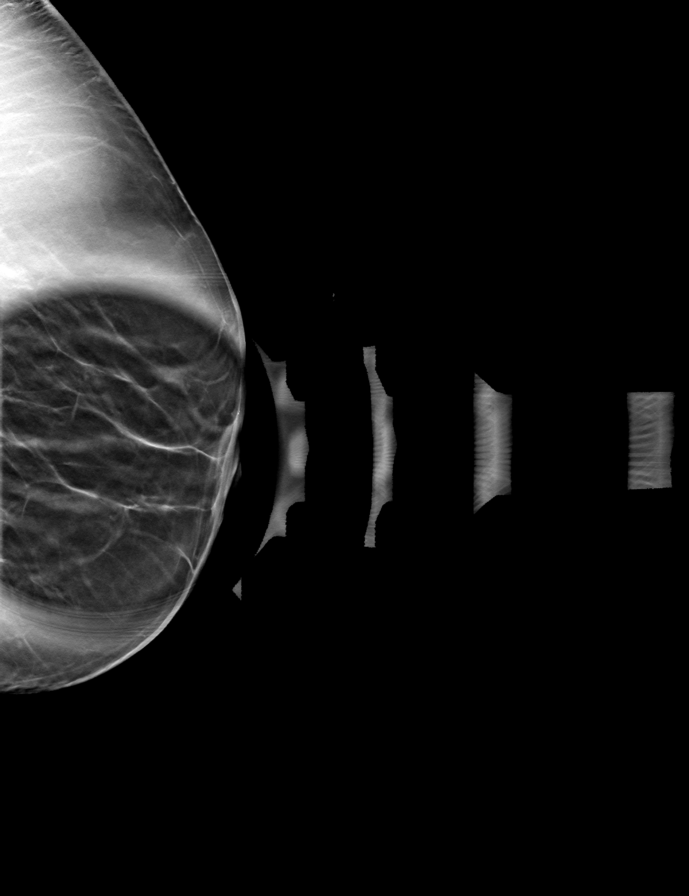

[L CC tomo · tomo slice 32/63.0]
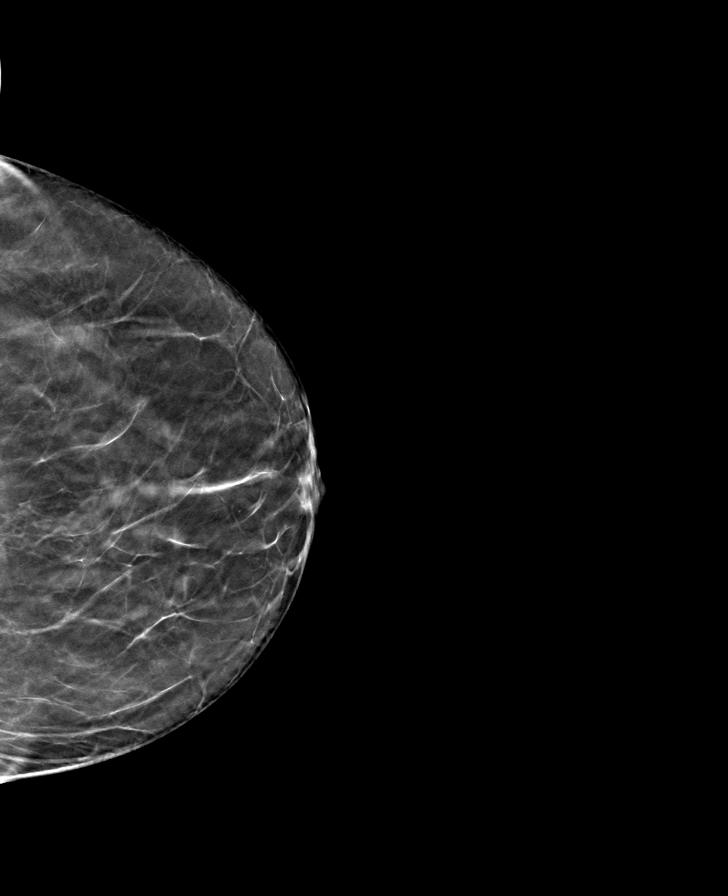

[L MLO tomo (2 of 2) · tomo slice 36/71.0]
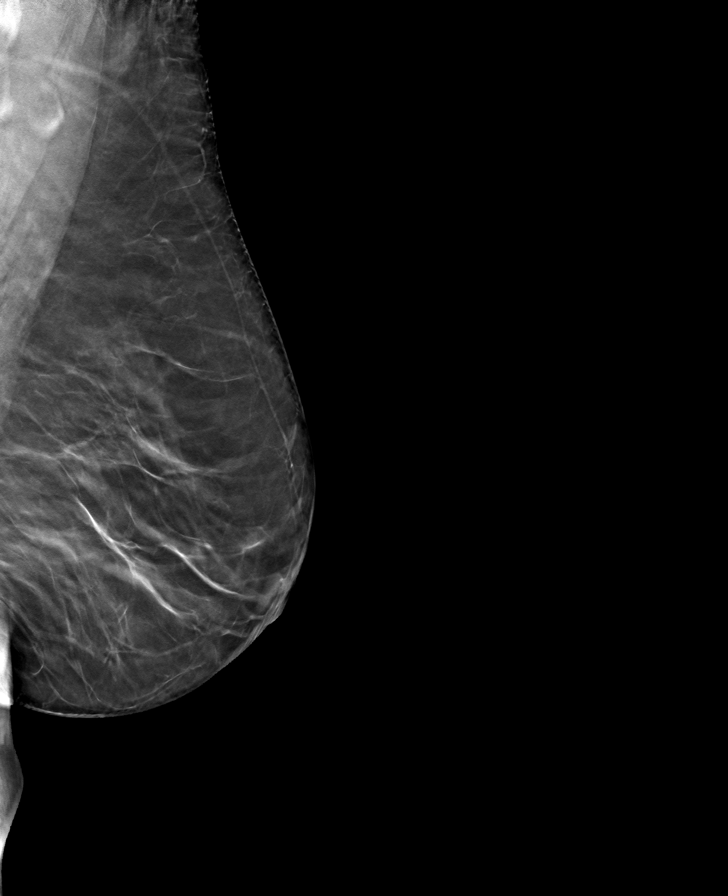

[8 of 20 positions shown; findings below may reference images not displayed]

ACR Breast Density Category b: There are scattered areas of
fibroglandular density.
FINDINGS: No suspicious masses or calcifications are seen in the left breast.
Mild flattening of the left nipple appears similar when compared to
prior mammograms dating back to at least 2218. spot compression MLO
tomograms of the retroareolar left breast were performed with no
suspicious retroareolar abnormalities identified. Initially
questioned calcifications within the lower slightly inner left
breast appear scattered and punctate, benign in appearance.

Mammographic images were processed with CAD.

Physical examination reveals normal appearance of the left nipple
areola complex. This is symmetric when compared to the right. There
is no left nipple inversion observed today. No retroareolar palpable
masses.

Targeted ultrasound of the retroareolar left breast was performed.
No suspicious masses or abnormalities identified.
IMPRESSION: 1. No mammographic or sonographic abnormalities identified to
explain patient's episode of left nipple inversion.

2.  No mammographic evidence of malignancy in the left breast.

RECOMMENDATION:
1. Recommend further management of the episode of left nipple
inversion be based on clinical assessment. If the patient
experiences inversion of the left nipple that becomes fixed, then
further evaluation with breast MRI is recommended.

2.  Recommend annual routine screening mammography, due July 2020.

I have discussed the findings and recommendations with the patient.
If applicable, a reminder letter will be sent to the patient
regarding the next appointment.

BI-RADS CATEGORY  1: Negative.

## 2021-09-27 IMAGING — US US BREAST*L* LIMITED INC AXILLA
1 series · 4 of 4 positions shown · non-contrast
Comparison: Previous exams.

CLINICAL DATA: 63-year-old female with reported new left nipple
inversion.

EXAM:
DIGITAL DIAGNOSTIC UNILATERAL LEFT MAMMOGRAM WITH TOMO AND CAD;
ULTRASOUND LEFT BREAST LIMITED

[Series 1: us breast*left* limited inc axilla · 0.07mm/px · 4 of 4 slices shown]
[im 1/4]
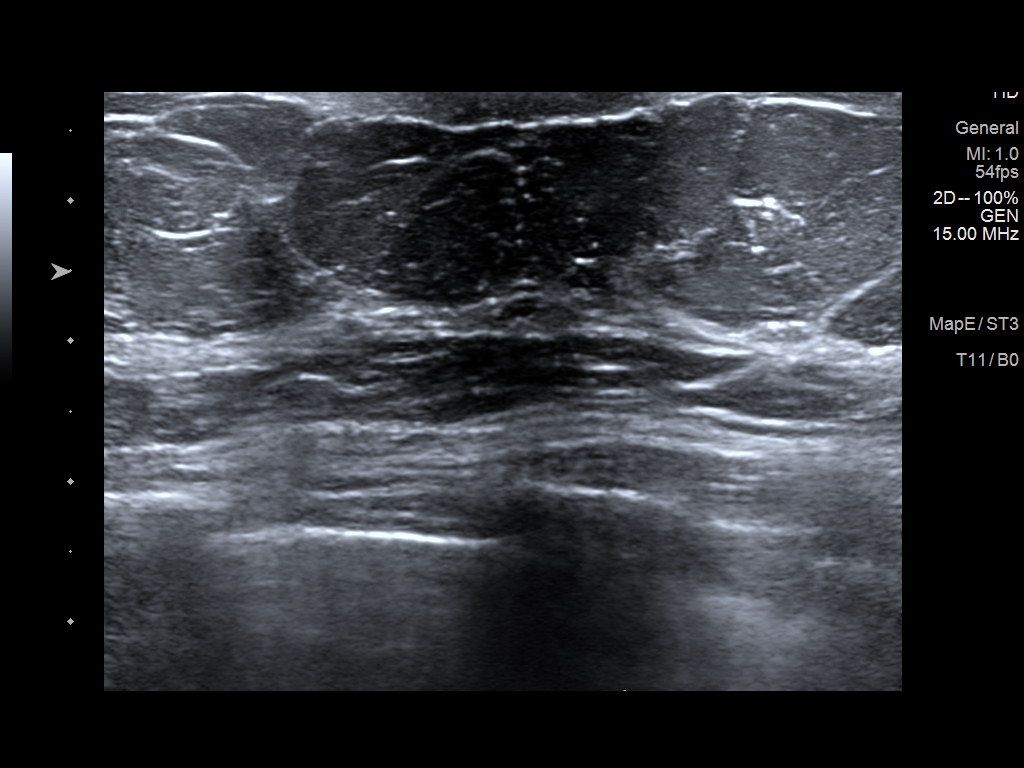
[im 2/4]
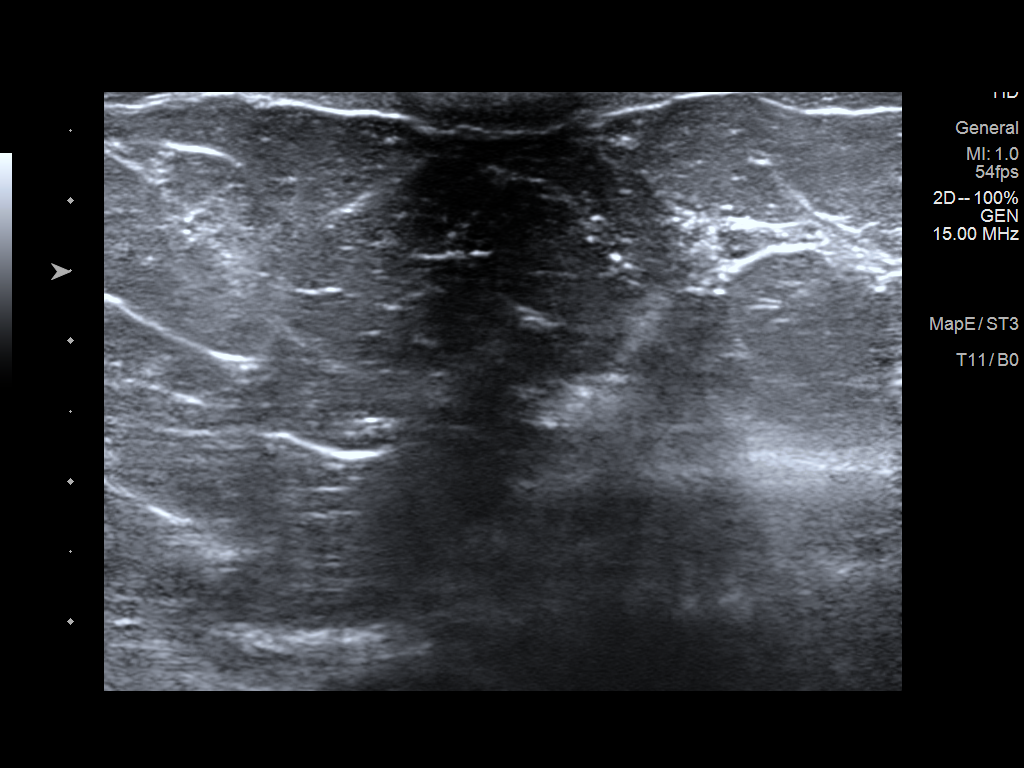
[im 3/4]
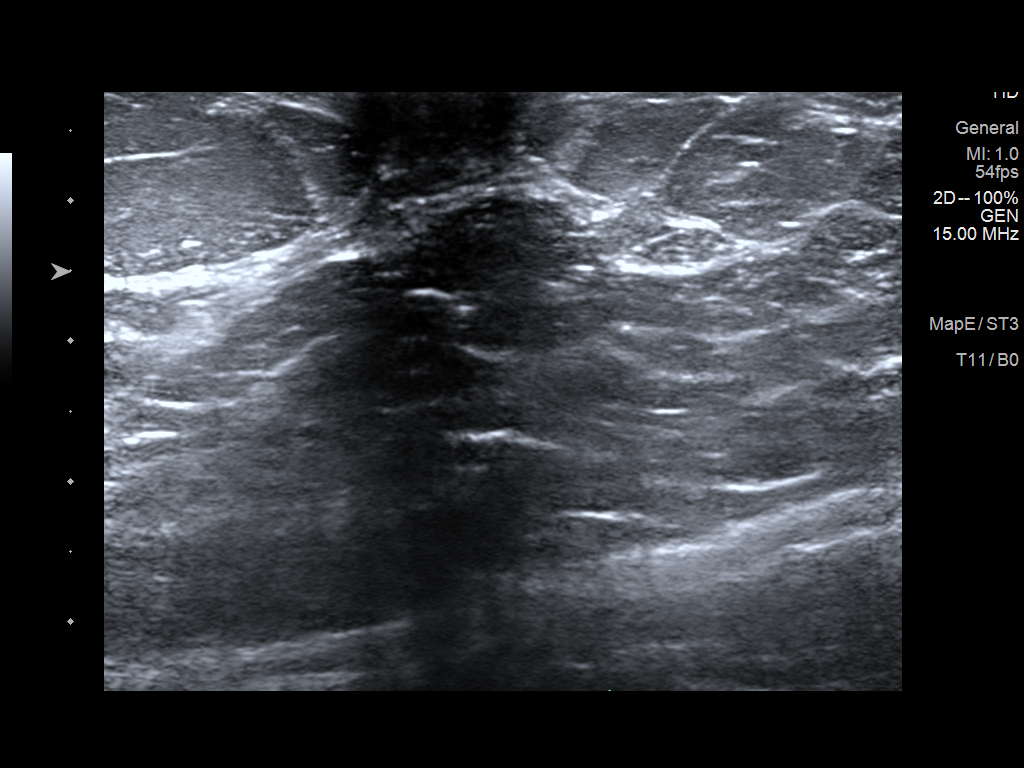
[im 4/4]
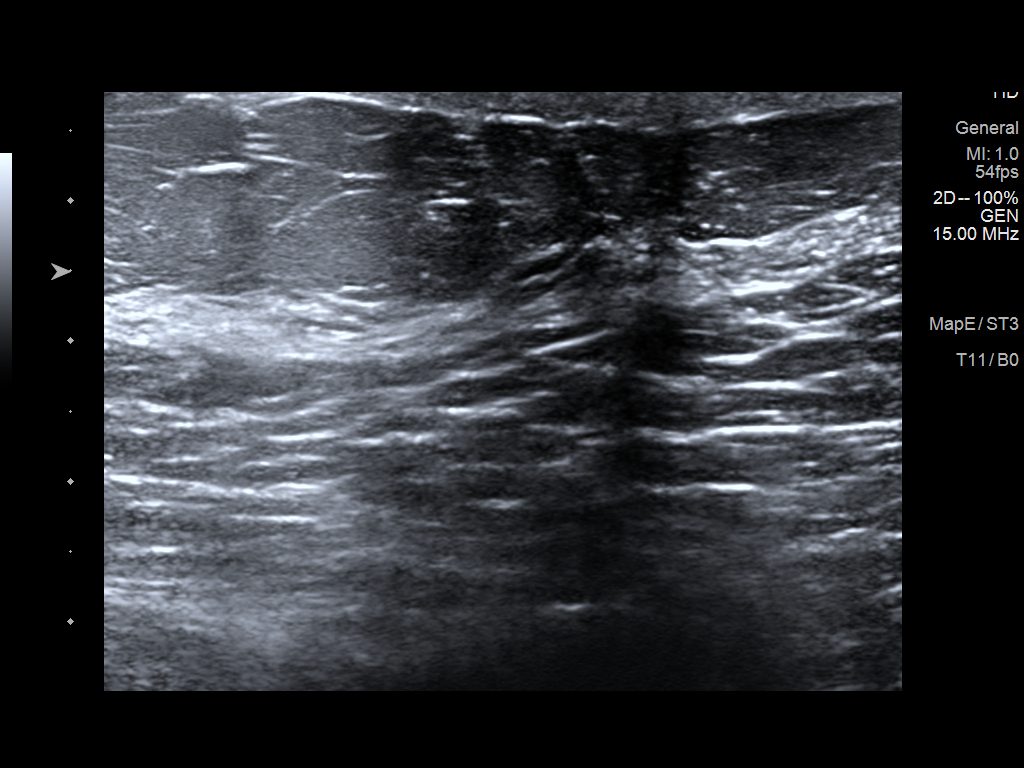

[4 of 4 positions shown; findings below may reference images not displayed]

ACR Breast Density Category b: There are scattered areas of
fibroglandular density.
FINDINGS: No suspicious masses or calcifications are seen in the left breast.
Mild flattening of the left nipple appears similar when compared to
prior mammograms dating back to at least 2218. spot compression MLO
tomograms of the retroareolar left breast were performed with no
suspicious retroareolar abnormalities identified. Initially
questioned calcifications within the lower slightly inner left
breast appear scattered and punctate, benign in appearance.

Mammographic images were processed with CAD.

Physical examination reveals normal appearance of the left nipple
areola complex. This is symmetric when compared to the right. There
is no left nipple inversion observed today. No retroareolar palpable
masses.

Targeted ultrasound of the retroareolar left breast was performed.
No suspicious masses or abnormalities identified.
IMPRESSION: 1. No mammographic or sonographic abnormalities identified to
explain patient's episode of left nipple inversion.

2.  No mammographic evidence of malignancy in the left breast.

RECOMMENDATION:
1. Recommend further management of the episode of left nipple
inversion be based on clinical assessment. If the patient
experiences inversion of the left nipple that becomes fixed, then
further evaluation with breast MRI is recommended.

2.  Recommend annual routine screening mammography, due July 2020.

I have discussed the findings and recommendations with the patient.
If applicable, a reminder letter will be sent to the patient
regarding the next appointment.

BI-RADS CATEGORY  1: Negative.

## 2021-09-27 MED ORDER — REPATHA SURECLICK 140 MG/ML ~~LOC~~ SOAJ
1.0000 | SUBCUTANEOUS | 3 refills | Status: DC
  Filled 2021-09-27: qty 6, 84d supply, fill #0
  Filled 2021-12-09: qty 6, 84d supply, fill #1
  Filled 2022-01-31 – 2022-03-02 (×2): qty 6, 84d supply, fill #2
  Filled 2022-05-24: qty 6, 84d supply, fill #3

## 2021-10-12 ENCOUNTER — Ambulatory Visit
Admission: RE | Admit: 2021-10-12 | Discharge: 2021-10-12 | Disposition: A | Discharge status: Home / Self Care | Payer: No Typology Code available for payment source | Source: Ambulatory Visit | Attending: Internal Medicine | Admitting: Internal Medicine

## 2021-10-12 ENCOUNTER — Encounter: Payer: Self-pay | Admitting: Internal Medicine

## 2021-10-12 DIAGNOSIS — M8580 Other specified disorders of bone density and structure, unspecified site: Secondary | ICD-10-CM | POA: Insufficient documentation

## 2021-10-12 DIAGNOSIS — Z13820 Encounter for screening for osteoporosis: Secondary | ICD-10-CM

## 2021-10-12 DIAGNOSIS — Z1231 Encounter for screening mammogram for malignant neoplasm of breast: Secondary | ICD-10-CM

## 2021-11-06 ENCOUNTER — Other Ambulatory Visit (HOSPITAL_COMMUNITY): Payer: Self-pay

## 2021-11-06 ENCOUNTER — Other Ambulatory Visit: Payer: Self-pay | Admitting: Internal Medicine

## 2021-11-07 ENCOUNTER — Other Ambulatory Visit: Payer: Self-pay | Admitting: Internal Medicine

## 2021-11-07 ENCOUNTER — Other Ambulatory Visit (HOSPITAL_COMMUNITY): Payer: Self-pay

## 2021-11-07 MED ORDER — LEVOTHYROXINE SODIUM 88 MCG PO TABS
88.0000 ug | ORAL_TABLET | Freq: Every day | ORAL | 0 refills | Status: DC
Start: 2021-11-07 — End: 2021-12-09
  Filled 2021-11-07: qty 30, 30d supply, fill #0

## 2021-11-07 MED ORDER — FREESTYLE LIBRE 14 DAY SENSOR MISC
3 refills | Status: DC
  Filled 2021-11-07: qty 6, 84d supply, fill #0

## 2021-11-08 ENCOUNTER — Other Ambulatory Visit (HOSPITAL_COMMUNITY): Payer: Self-pay

## 2021-11-08 ENCOUNTER — Other Ambulatory Visit: Payer: Self-pay | Admitting: Internal Medicine

## 2021-11-08 MED ORDER — DEXCOM G7 RECEIVER DEVI
0 refills | Status: DC
  Filled 2021-11-08: qty 1, 90d supply, fill #0
  Filled 2021-12-09: qty 1, 30d supply, fill #0

## 2021-11-08 MED ORDER — DEXCOM G7 SENSOR MISC
5 refills | Status: DC
  Filled 2021-11-08: qty 3, 30d supply, fill #0
  Filled 2021-12-09: qty 3, 30d supply, fill #1
  Filled 2023-01-24: qty 3, 30d supply, fill #2

## 2021-11-26 ENCOUNTER — Encounter: Payer: Self-pay | Admitting: Internal Medicine

## 2021-11-26 DIAGNOSIS — E119 Type 2 diabetes mellitus without complications: Secondary | ICD-10-CM

## 2021-11-26 DIAGNOSIS — E782 Mixed hyperlipidemia: Secondary | ICD-10-CM

## 2021-11-26 DIAGNOSIS — E559 Vitamin D deficiency, unspecified: Secondary | ICD-10-CM

## 2021-11-26 DIAGNOSIS — E039 Hypothyroidism, unspecified: Secondary | ICD-10-CM

## 2021-11-30 DIAGNOSIS — E559 Vitamin D deficiency, unspecified: Secondary | ICD-10-CM | POA: Insufficient documentation

## 2021-12-01 ENCOUNTER — Other Ambulatory Visit (INDEPENDENT_AMBULATORY_CARE_PROVIDER_SITE_OTHER): Payer: No Typology Code available for payment source

## 2021-12-01 DIAGNOSIS — E119 Type 2 diabetes mellitus without complications: Secondary | ICD-10-CM | POA: Diagnosis not present

## 2021-12-01 DIAGNOSIS — E782 Mixed hyperlipidemia: Secondary | ICD-10-CM | POA: Diagnosis not present

## 2021-12-01 DIAGNOSIS — E039 Hypothyroidism, unspecified: Secondary | ICD-10-CM

## 2021-12-01 DIAGNOSIS — E559 Vitamin D deficiency, unspecified: Secondary | ICD-10-CM | POA: Diagnosis not present

## 2021-12-01 LAB — CBC WITH DIFFERENTIAL/PLATELET
Basophils Absolute: 0 10*3/uL (ref 0.0–0.1)
Basophils Relative: 0.6 % (ref 0.0–3.0)
Eosinophils Absolute: 0.1 10*3/uL (ref 0.0–0.7)
Eosinophils Relative: 2.5 % (ref 0.0–5.0)
HCT: 41.6 % (ref 36.0–46.0)
Hemoglobin: 13.6 g/dL (ref 12.0–15.0)
Lymphocytes Relative: 30.2 % (ref 12.0–46.0)
Lymphs Abs: 1.5 10*3/uL (ref 0.7–4.0)
MCHC: 32.6 g/dL (ref 30.0–36.0)
MCV: 90 fl (ref 78.0–100.0)
Monocytes Absolute: 0.5 10*3/uL (ref 0.1–1.0)
Monocytes Relative: 11.1 % (ref 3.0–12.0)
Neutro Abs: 2.7 10*3/uL (ref 1.4–7.7)
Neutrophils Relative %: 55.6 % (ref 43.0–77.0)
Platelets: 247 10*3/uL (ref 150.0–400.0)
RBC: 4.62 Mil/uL (ref 3.87–5.11)
RDW: 15.1 % (ref 11.5–15.5)
WBC: 4.8 10*3/uL (ref 4.0–10.5)

## 2021-12-01 LAB — COMPREHENSIVE METABOLIC PANEL
ALT: 11 U/L (ref 0–35)
AST: 16 U/L (ref 0–37)
Albumin: 4.3 g/dL (ref 3.5–5.2)
Alkaline Phosphatase: 57 U/L (ref 39–117)
BUN: 26 mg/dL — ABNORMAL HIGH (ref 6–23)
CO2: 29 mEq/L (ref 19–32)
Calcium: 9.3 mg/dL (ref 8.4–10.5)
Chloride: 103 mEq/L (ref 96–112)
Creatinine, Ser: 0.7 mg/dL (ref 0.40–1.20)
GFR: 90.68 mL/min (ref >60.00)
Glucose, Bld: 114 mg/dL — ABNORMAL HIGH (ref 70–99)
Potassium: 3.7 mEq/L (ref 3.5–5.1)
Sodium: 139 mEq/L (ref 135–145)
Total Bilirubin: 0.7 mg/dL (ref 0.2–1.2)
Total Protein: 7.3 g/dL (ref 6.0–8.3)

## 2021-12-01 LAB — HEMOGLOBIN A1C: Hgb A1c MFr Bld: 6.3 % (ref 4.6–6.5)

## 2021-12-01 LAB — LIPID PANEL
Cholesterol: 188 mg/dL (ref 0–200)
HDL: 59.9 mg/dL (ref >39.00)
LDL Cholesterol: 107 mg/dL — ABNORMAL HIGH (ref 0–99)
NonHDL: 128.27
Total CHOL/HDL Ratio: 3
Triglycerides: 105 mg/dL (ref 0.0–149.0)
VLDL: 21 mg/dL (ref 0.0–40.0)

## 2021-12-01 LAB — MAGNESIUM: Magnesium: 2.3 mg/dL (ref 1.5–2.5)

## 2021-12-01 LAB — VITAMIN B12: Vitamin B-12: 322 pg/mL (ref 211–911)

## 2021-12-01 LAB — TSH: TSH: 3.59 u[IU]/mL (ref 0.35–5.50)

## 2021-12-01 LAB — VITAMIN D 25 HYDROXY (VIT D DEFICIENCY, FRACTURES): VITD: 32.27 ng/mL (ref 30.00–100.00)

## 2021-12-02 ENCOUNTER — Encounter: Payer: Self-pay | Admitting: Internal Medicine

## 2021-12-02 NOTE — Patient Instructions (Addendum)
      Medications changes include :   none    Return in about 6 months (around 06/06/2022) for Physical Exam.

## 2021-12-02 NOTE — Progress Notes (Unsigned)
Subjective:    Patient ID: Cheryl Chan, female    DOB: [birth year], 66 y.o.   MRN: [id number]     HPI Cheryl Chan is here for follow up of her chronic medical problems, including FM, hld, hypothyroidism, depression  She is taking all of her medications as prescribed.    Medications and allergies reviewed with patient and updated if appropriate.  Current Outpatient Medications on File Prior to Visit  Medication Sig Dispense Refill   Continuous Blood Gluc Receiver (DEXCOM G7 RECEIVER) DEVI Use as directed to check sugars. 1 each 0   Continuous Blood Gluc Sensor (DEXCOM G7 SENSOR) MISC Use as directed to monitor sugars. 3 each 5   dapagliflozin propanediol (FARXIGA) 10 MG TABS tablet TAKE 1 TABLET BY MOUTH ONCE DAILY BEFORE BREAKFAST 90 tablet 1   Esomeprazole Magnesium 20 MG TBEC      Evolocumab (REPATHA SURECLICK) 409 MG/ML SOAJ Inject 1 Dose into the skin every 14 (fourteen) days. 6 mL 3   fluticasone (FLONASE) 50 MCG/ACT nasal spray Place 2 sprays into both nostrils daily. 48 g 0   glucose blood (FREESTYLE LITE) test strip Test 3 times daily as needed 100 strip 12   Lancets (FREESTYLE) lancets Use as instructed 100 each 12   levothyroxine (SYNTHROID) 88 MCG tablet Take 1 tablet (88 mcg total) by mouth daily before breakfast. 30 tablet 0   loperamide (IMODIUM) 2 MG capsule      sertraline (ZOLOFT) 100 MG tablet Take 1 tablet (100 mg total) by mouth daily. 90 tablet 3   tirzepatide (MOUNJARO) 5 MG/0.5ML Pen Inject 5 mg into the skin once a week. Annual appt due in June must see provider for future refills 2 mL 2   Turmeric 500 MG CAPS Take by mouth.     No current facility-administered medications on file prior to visit.     Review of Systems  Constitutional:  Negative for chills and fever.  Respiratory:  Negative for cough, shortness of breath and wheezing.   Cardiovascular:  Negative for chest pain, palpitations and leg swelling.  Neurological:  Negative for light-headedness  and headaches.       Objective:   Vitals:   12/04/21 0836  BP: 118/70  Pulse: 82  Temp: 98.1 F (36.7 C)  SpO2: 98%   BP Readings from Last 3 Encounters:  12/04/21 118/70  06/27/21 126/68  05/24/21 128/68   Wt Readings from Last 3 Encounters:  12/04/21 130 lb (59 kg)  06/27/21 145 lb 6.4 oz (66 kg)  05/24/21 155 lb 6.4 oz (70.5 kg)   Body mass index is 23.78 kg/m.    Physical Exam Constitutional:      General: She is not in acute distress.    Appearance: Normal appearance.  HENT:     Head: Normocephalic and atraumatic.  Eyes:     Conjunctiva/sclera: Conjunctivae normal.  Cardiovascular:     Rate and Rhythm: Normal rate and regular rhythm.     Heart sounds: Normal heart sounds. No murmur heard. Pulmonary:     Effort: Pulmonary effort is normal. No respiratory distress.     Breath sounds: Normal breath sounds. No wheezing.  Musculoskeletal:     Cervical back: Neck supple.     Right lower leg: No edema.     Left lower leg: No edema.  Lymphadenopathy:     Cervical: No cervical adenopathy.  Skin:    General: Skin is warm and dry.     Findings: No  rash.  Neurological:     Mental Status: She is alert. Mental status is at baseline.  Psychiatric:        Mood and Affect: Mood normal.        Behavior: Behavior normal.        Lab Results  Component Value Date   WBC 4.8 12/01/2021   HGB 13.6 12/01/2021   HCT 41.6 12/01/2021   PLT 247.0 12/01/2021   GLUCOSE 114 (H) 12/01/2021   CHOL 188 12/01/2021   TRIG 105.0 12/01/2021   HDL 59.90 12/01/2021   LDLDIRECT 195.0 06/12/2019   LDLCALC 107 (H) 12/01/2021   ALT 11 12/01/2021   AST 16 12/01/2021   NA 139 12/01/2021   K 3.7 12/01/2021   CL 103 12/01/2021   CREATININE 0.70 12/01/2021   BUN 26 (H) 12/01/2021   CO2 29 12/01/2021   TSH 3.59 12/01/2021   HGBA1C 6.3 12/01/2021   MICROALBUR 1.0 05/25/2021     Assessment & Plan:    See Problem List for Assessment and Plan of chronic medical problems.

## 2021-12-04 ENCOUNTER — Ambulatory Visit (INDEPENDENT_AMBULATORY_CARE_PROVIDER_SITE_OTHER): Payer: No Typology Code available for payment source | Admitting: Internal Medicine

## 2021-12-04 VITALS — BP 118/70 | HR 82 | Temp 98.1°F | Ht 62.0 in | Wt 130.0 lb

## 2021-12-04 DIAGNOSIS — E039 Hypothyroidism, unspecified: Secondary | ICD-10-CM

## 2021-12-04 DIAGNOSIS — E119 Type 2 diabetes mellitus without complications: Secondary | ICD-10-CM

## 2021-12-04 DIAGNOSIS — F3289 Other specified depressive episodes: Secondary | ICD-10-CM | POA: Diagnosis not present

## 2021-12-04 DIAGNOSIS — E782 Mixed hyperlipidemia: Secondary | ICD-10-CM

## 2021-12-04 DIAGNOSIS — R6882 Decreased libido: Secondary | ICD-10-CM

## 2021-12-04 MED ORDER — FREESTYLE LITE TEST VI STRP: ORAL_STRIP | 12 refills | Status: AC

## 2021-12-04 NOTE — Assessment & Plan Note (Signed)
Chronic  Clinically euthyroid Currently taking levothyroxine 88 mcg daily Lab Results  Component Value Date   TSH 3.59 12/01/2021   Continue current dose of levothyroxine given normal TSH

## 2021-12-04 NOTE — Assessment & Plan Note (Signed)
Decreased more now - ? related to mounjaro Will check testosterone level - can discuss with gyn if low given h/o uterine ca

## 2021-12-04 NOTE — Assessment & Plan Note (Signed)
Chronic Regular exercise and healthy diet encouraged Check lipid panel  Continue Repatha 140 mg q. 14 days 

## 2021-12-04 NOTE — Assessment & Plan Note (Addendum)
Chronic  Lab Results  Component Value Date   HGBA1C 6.3 12/01/2021   Sugars well controlled Testing sugars with Dexcom Continue Farxiga 10 mg daily, Mounjaro 5 mg weekly - higher dose not needed at this time Stressed regular exercise, diabetic diet

## 2021-12-04 NOTE — Assessment & Plan Note (Signed)
Chronic Controlled, Stable Continue sertraline 100 mg daily 

## 2021-12-08 LAB — TESTOSTERONE, FREE & TOTAL
Free Testosterone: 0.8 pg/mL (ref 0.1–6.4)
Testosterone, Total, LC-MS-MS: 11 ng/dL (ref 2–45)

## 2021-12-09 ENCOUNTER — Other Ambulatory Visit: Payer: Self-pay | Admitting: Internal Medicine

## 2021-12-09 ENCOUNTER — Other Ambulatory Visit (HOSPITAL_COMMUNITY): Payer: Self-pay

## 2021-12-11 ENCOUNTER — Encounter: Payer: Self-pay | Admitting: Internal Medicine

## 2021-12-11 ENCOUNTER — Other Ambulatory Visit (HOSPITAL_COMMUNITY): Payer: Self-pay

## 2021-12-11 MED ORDER — LEVOTHYROXINE SODIUM 88 MCG PO TABS
88.0000 ug | ORAL_TABLET | Freq: Every day | ORAL | 0 refills | Status: DC
  Filled 2021-12-11: qty 90, 90d supply, fill #0

## 2021-12-11 MED ORDER — DAPAGLIFLOZIN PROPANEDIOL 10 MG PO TABS
ORAL_TABLET | Freq: Every day | ORAL | 1 refills | Status: DC
  Filled 2021-12-11: qty 90, 90d supply, fill #0

## 2021-12-12 ENCOUNTER — Other Ambulatory Visit: Payer: Self-pay

## 2021-12-12 ENCOUNTER — Other Ambulatory Visit (HOSPITAL_COMMUNITY): Payer: Self-pay

## 2021-12-12 MED ORDER — LEVOTHYROXINE SODIUM 88 MCG PO TABS: 88.0000 ug | ORAL_TABLET | Freq: Every day | ORAL | 0 refills | Status: DC

## 2021-12-12 MED ORDER — MOUNJARO 5 MG/0.5ML ~~LOC~~ SOAJ: 5.0000 mg | SUBCUTANEOUS | 2 refills | Status: DC

## 2021-12-12 MED ORDER — DAPAGLIFLOZIN PROPANEDIOL 10 MG PO TABS: ORAL_TABLET | Freq: Every day | ORAL | 1 refills | Status: DC

## 2021-12-12 MED ORDER — MOUNJARO 5 MG/0.5ML ~~LOC~~ SOAJ
5.0000 mg | SUBCUTANEOUS | 2 refills | Status: DC
  Filled 2021-12-12: qty 6, 84d supply, fill #0

## 2021-12-19 ENCOUNTER — Other Ambulatory Visit (HOSPITAL_COMMUNITY): Payer: Self-pay

## 2021-12-19 MED ORDER — DAPAGLIFLOZIN PROPANEDIOL 10 MG PO TABS
10.0000 mg | ORAL_TABLET | Freq: Every day | ORAL | 1 refills | Status: DC
  Filled 2022-01-03 – 2022-01-05 (×2): qty 90, 90d supply, fill #0
  Filled 2022-05-16: qty 60, 60d supply, fill #1

## 2021-12-19 MED ORDER — MOUNJARO 5 MG/0.5ML ~~LOC~~ SOAJ
5.0000 mg | SUBCUTANEOUS | 2 refills | Status: DC
  Filled 2021-12-19: qty 6, 84d supply, fill #0
  Filled 2022-01-31 – 2022-03-02 (×2): qty 6, 84d supply, fill #1
  Filled 2022-05-24: qty 6, 84d supply, fill #2

## 2021-12-19 MED ORDER — LEVOTHYROXINE SODIUM 88 MCG PO TABS
88.0000 ug | ORAL_TABLET | Freq: Every day | ORAL | 0 refills | Status: DC
  Filled 2022-01-03: qty 30, 30d supply, fill #0
  Filled 2022-01-05: qty 60, 60d supply, fill #0

## 2022-01-03 ENCOUNTER — Other Ambulatory Visit: Payer: Self-pay | Admitting: Internal Medicine

## 2022-01-03 ENCOUNTER — Other Ambulatory Visit (HOSPITAL_COMMUNITY): Payer: Self-pay

## 2022-01-03 MED ORDER — SERTRALINE HCL 100 MG PO TABS
100.0000 mg | ORAL_TABLET | Freq: Every day | ORAL | 3 refills | Status: DC
  Filled 2022-01-03: qty 90, 90d supply, fill #0
  Filled 2022-04-13: qty 90, 90d supply, fill #1
  Filled 2022-07-16: qty 90, 90d supply, fill #2
  Filled 2022-10-17: qty 90, 90d supply, fill #3

## 2022-01-05 ENCOUNTER — Other Ambulatory Visit (HOSPITAL_COMMUNITY): Payer: Self-pay

## 2022-01-31 ENCOUNTER — Other Ambulatory Visit (HOSPITAL_COMMUNITY): Payer: Self-pay

## 2022-03-02 ENCOUNTER — Other Ambulatory Visit (HOSPITAL_COMMUNITY): Payer: Self-pay

## 2022-03-09 ENCOUNTER — Other Ambulatory Visit: Payer: Self-pay

## 2022-03-09 ENCOUNTER — Other Ambulatory Visit (HOSPITAL_COMMUNITY): Payer: Self-pay

## 2022-03-09 MED ORDER — INFLUENZA VAC A&B SA ADJ QUAD 0.5 ML IM PRSY
0.5000 mL | PREFILLED_SYRINGE | INTRAMUSCULAR | 0 refills | Status: DC
Start: 2022-03-09 — End: 2022-06-06
  Filled 2022-03-09: qty 0.5, 1d supply, fill #0

## 2022-03-09 MED ORDER — COVID-19 MRNA 2023-2024 VACCINE (COMIRNATY) 0.3 ML INJECTION
0.3000 mL | Freq: Once | INTRAMUSCULAR | 0 refills | Status: AC
  Filled 2022-03-09: qty 0.3, 1d supply, fill #0

## 2022-03-13 ENCOUNTER — Other Ambulatory Visit: Payer: Self-pay | Admitting: Internal Medicine

## 2022-03-13 ENCOUNTER — Other Ambulatory Visit (HOSPITAL_COMMUNITY): Payer: Self-pay

## 2022-03-13 MED ORDER — LEVOTHYROXINE SODIUM 88 MCG PO TABS
88.0000 ug | ORAL_TABLET | Freq: Every day | ORAL | 0 refills | Status: DC
  Filled 2022-03-13: qty 90, 90d supply, fill #0

## 2022-03-26 ENCOUNTER — Encounter: Payer: Self-pay | Admitting: Internal Medicine

## 2022-04-13 ENCOUNTER — Other Ambulatory Visit (HOSPITAL_COMMUNITY): Payer: Self-pay

## 2022-05-02 ENCOUNTER — Other Ambulatory Visit: Payer: Self-pay | Admitting: *Deleted

## 2022-05-02 DIAGNOSIS — E7801 Familial hypercholesterolemia: Secondary | ICD-10-CM

## 2022-05-16 ENCOUNTER — Other Ambulatory Visit (HOSPITAL_COMMUNITY): Payer: Self-pay

## 2022-05-24 ENCOUNTER — Other Ambulatory Visit (HOSPITAL_COMMUNITY): Payer: Self-pay

## 2022-05-25 ENCOUNTER — Other Ambulatory Visit (HOSPITAL_COMMUNITY): Payer: Self-pay

## 2022-05-28 ENCOUNTER — Telehealth: Payer: 59 | Admitting: Physician Assistant

## 2022-05-28 DIAGNOSIS — Z20828 Contact with and (suspected) exposure to other viral communicable diseases: Secondary | ICD-10-CM

## 2022-05-28 MED ORDER — OSELTAMIVIR PHOSPHATE 75 MG PO CAPS
75.0000 mg | ORAL_CAPSULE | Freq: Every day | ORAL | 0 refills | Status: DC
  Filled 2022-05-28: qty 10, 10d supply, fill #0

## 2022-05-28 NOTE — Progress Notes (Signed)
Virtual Visit Consent   Cheryl Chan, you are scheduled for a virtual visit with a [facility] provider today. Just as with appointments in the office, your consent must be obtained to participate. Your consent will be active for this visit and any virtual visit you may have with one of our providers in the next 365 days. If you have a MyChart account, a copy of this consent can be sent to you electronically.  As this is a virtual visit, video technology does not allow for your provider to perform a traditional examination. This may limit your provider's ability to fully assess your condition. If your provider identifies any concerns that need to be evaluated in person or the need to arrange testing (such as labs, EKG, etc.), we will make arrangements to do so. Although advances in technology are sophisticated, we cannot ensure that it will always work on either your end or our end. If the connection with a video visit is poor, the visit may have to be switched to a telephone visit. With either a video or telephone visit, we are not always able to ensure that we have a secure connection.  By engaging in this virtual visit, you consent to the provision of healthcare and authorize for your insurance to be billed (if applicable) for the services provided during this visit. Depending on your insurance coverage, you may receive a charge related to this service.  I need to obtain your verbal consent now. Are you willing to proceed with your visit today? Cheryl Chan has provided verbal consent on 05/28/2022 for a virtual visit (video or telephone). Mar Daring, PA-C  Date: 05/28/2022 8:19 PM  Virtual Visit via Video Note   I, Mar Daring, connected with  Cheryl Chan  ([id number], [birth year]) on 05/28/22 at  8:15 PM EST by a video-enabled telemedicine application and verified that I am speaking with the correct person using two identifiers.  Location: Patient: Virtual Visit Location Patient:  Home Provider: Virtual Visit Location Provider: Home Office   I discussed the limitations of evaluation and management by telemedicine and the availability of in person appointments. The patient expressed understanding and agreed to proceed.    History of Present Illness: Cheryl Chan is a 67 y.o. who identifies as a female who was assigned female at birth, and is being seen today for flu exposure. Family has been diagnosed with influenza A. She has been in direct contact. Not currently with any symptoms.   Problems:  Patient Active Problem List   Diagnosis Date Noted   Decreased libido 12/04/2021   Vitamin D deficiency 11/30/2021   Osteopenia 10/12/2021   Diarrhea 10/25/2020   Osteoarthritis 04/22/2017   Onychomycosis of toenail 09/25/2016   Overweight 01/25/2015   Rotator cuff injury    History of uterine cancer 11/12/2011   Non-insulin dependent type 2 diabetes mellitus ([place])    Hypothyroidism    Hyperlipidemia    Depression     Allergies:  Allergies  Allergen Reactions   Crestor [Rosuvastatin Calcium]     Muscle ache   Demerol [Meperidine]    Statins    Trulicity [Dulaglutide]     Nausea, diarrhea   Victoza [Liraglutide]     Nausea, diarrhea   Metformin And Related     diarrhea   Medications:  Current Outpatient Medications:    oseltamivir (TAMIFLU) 75 MG capsule, Take 1 capsule (75 mg total) by mouth daily., Disp: 10 capsule, Rfl: 0   Continuous  Blood Gluc Receiver ([place]) DEVI, Use as directed to check sugars., Disp: 1 each, Rfl: 0   Continuous Blood Gluc Sensor (DEXCOM G7 SENSOR) MISC, Use as directed to monitor sugars., Disp: 3 each, Rfl: 5   dapagliflozin propanediol (FARXIGA) 10 MG TABS tablet, TAKE 1 TABLET BY MOUTH ONCE DAILY BEFORE BREAKFAST, Disp: 90 tablet, Rfl: 1   dapagliflozin propanediol (FARXIGA) 10 MG TABS tablet, Take 1 tablet (10 mg total) by mouth daily before breakfast., Disp: 90 tablet, Rfl: 1   Esomeprazole Magnesium 20 MG TBEC, ,  Disp: , Rfl:    Evolocumab (REPATHA SURECLICK) 923 MG/ML SOAJ, Inject 1 Dose into the skin every 14 (fourteen) days., Disp: 6 mL, Rfl: 3   fluticasone (FLONASE) 50 MCG/ACT nasal spray, Place 2 sprays into both nostrils daily., Disp: 48 g, Rfl: 0   glucose blood (FREESTYLE LITE) test strip, Test 3 times daily as needed, Disp: 100 strip, Rfl: 12   influenza vaccine adjuvanted (FLUAD) 0.5 ML injection, Inject 0.5 mLs into the muscle., Disp: 0.5 mL, Rfl: 0   Lancets (FREESTYLE) lancets, Use as instructed, Disp: 100 each, Rfl: 12   levothyroxine (SYNTHROID) 88 MCG tablet, Take 1 tablet (88 mcg total) by mouth daily before breakfast., Disp: 90 tablet, Rfl: 0   levothyroxine (SYNTHROID) 88 MCG tablet, Take 1 tablet (88 mcg total) by mouth daily before breakfast., Disp: 90 tablet, Rfl: 0   loperamide (IMODIUM) 2 MG capsule, , Disp: , Rfl:    sertraline (ZOLOFT) 100 MG tablet, Take 1 tablet (100 mg total) by mouth daily., Disp: 90 tablet, Rfl: 3   tirzepatide (MOUNJARO) 5 MG/0.5ML Pen, Inject 5 mg into the skin once a week., Disp: 6 mL, Rfl: 2   tirzepatide (MOUNJARO) 5 MG/0.5ML Pen, Inject 5 mg into the skin once a week., Disp: 6 mL, Rfl: 2   Turmeric 500 MG CAPS, Take by mouth., Disp: , Rfl:   Observations/Objective: Patient is well-developed, well-nourished in no acute distress.  Resting comfortably at home.  Head is normocephalic, atraumatic.  No labored breathing.  Speech is clear and coherent with logical content.  Patient is alert and oriented at baseline.    Assessment and Plan: 1. Exposure to the flu - oseltamivir (TAMIFLU) 75 MG capsule; Take 1 capsule (75 mg total) by mouth daily.  Dispense: 10 capsule; Refill: 0  - Tamiflu for prophylaxis - Vit C, Vit D, and Zinc for immune support - Stay hydrated - Rest - Follow up if symptoms develop  Follow Up Instructions: I discussed the assessment and treatment plan with the patient. The patient was provided an opportunity to ask questions  and all were answered. The patient agreed with the plan and demonstrated an understanding of the instructions.  A copy of instructions were sent to the patient via MyChart unless otherwise noted below.    The patient was advised to call back or seek an in-person evaluation if the symptoms worsen or if the condition fails to improve as anticipated.  Time:  I spent 8 minutes with the patient via telehealth technology discussing the above problems/concerns.    Mar Daring, PA-C

## 2022-05-28 NOTE — Patient Instructions (Signed)
Cheryl Chan, thank you for joining Mar Daring, PA-C for today's virtual visit.  While this provider is not your primary care provider (PCP), if your PCP is located in our provider database this encounter information will be shared with them immediately following your visit.   [facility] account gives you access to today's visit and all your visits, tests, and labs performed at [facility] " click here if you don't have a [facility] account or go to [email or web address]  Consent: (Patient) Cheryl Chan provided verbal consent for this virtual visit at the beginning of the encounter.  Current Medications:  Current Outpatient Medications:    oseltamivir (TAMIFLU) 75 MG capsule, Take 1 capsule (75 mg total) by mouth daily., Disp: 10 capsule, Rfl: 0   Continuous Blood Gluc Receiver ([place]) DEVI, Use as directed to check sugars., Disp: 1 each, Rfl: 0   Continuous Blood Gluc Sensor (DEXCOM G7 SENSOR) MISC, Use as directed to monitor sugars., Disp: 3 each, Rfl: 5   dapagliflozin propanediol (FARXIGA) 10 MG TABS tablet, TAKE 1 TABLET BY MOUTH ONCE DAILY BEFORE BREAKFAST, Disp: 90 tablet, Rfl: 1   dapagliflozin propanediol (FARXIGA) 10 MG TABS tablet, Take 1 tablet (10 mg total) by mouth daily before breakfast., Disp: 90 tablet, Rfl: 1   Esomeprazole Magnesium 20 MG TBEC, , Disp: , Rfl:    Evolocumab (REPATHA SURECLICK) 315 MG/ML SOAJ, Inject 1 Dose into the skin every 14 (fourteen) days., Disp: 6 mL, Rfl: 3   fluticasone (FLONASE) 50 MCG/ACT nasal spray, Place 2 sprays into both nostrils daily., Disp: 48 g, Rfl: 0   glucose blood (FREESTYLE LITE) test strip, Test 3 times daily as needed, Disp: 100 strip, Rfl: 12   influenza vaccine adjuvanted (FLUAD) 0.5 ML injection, Inject 0.5 mLs into the muscle., Disp: 0.5 mL, Rfl: 0   Lancets (FREESTYLE) lancets, Use as instructed, Disp: 100 each, Rfl: 12   levothyroxine (SYNTHROID) 88 MCG tablet, Take 1  tablet (88 mcg total) by mouth daily before breakfast., Disp: 90 tablet, Rfl: 0   levothyroxine (SYNTHROID) 88 MCG tablet, Take 1 tablet (88 mcg total) by mouth daily before breakfast., Disp: 90 tablet, Rfl: 0   loperamide (IMODIUM) 2 MG capsule, , Disp: , Rfl:    sertraline (ZOLOFT) 100 MG tablet, Take 1 tablet (100 mg total) by mouth daily., Disp: 90 tablet, Rfl: 3   tirzepatide (MOUNJARO) 5 MG/0.5ML Pen, Inject 5 mg into the skin once a week., Disp: 6 mL, Rfl: 2   tirzepatide (MOUNJARO) 5 MG/0.5ML Pen, Inject 5 mg into the skin once a week., Disp: 6 mL, Rfl: 2   Turmeric 500 MG CAPS, Take by mouth., Disp: , Rfl:    Medications ordered in this encounter:  Meds ordered this encounter  Medications   oseltamivir (TAMIFLU) 75 MG capsule    Sig: Take 1 capsule (75 mg total) by mouth daily.    Dispense:  10 capsule    Refill:  0    Order Specific Question:   Supervising Provider    Answer:   Chase Picket [id number]     *If you need refills on other medications prior to your next appointment, please contact your pharmacy*  Follow-Up: Call back or seek an in-person evaluation if the symptoms worsen or if the condition fails to improve as anticipated.  [place] [phone]  Other Instructions  Preventing Influenza, Adult Influenza, also known as the flu, is an infection caused  by a virus. The flu mainly affects the nose, throat, and lungs (respiratory system). This infection causes common cold symptoms, as well as a high fever and body aches. The flu spreads easily from person to person (is contagious). The flu is most common from December through March. This period of time is called flu season. You can catch the flu virus by: Breathing in droplets from an infected person's cough or sneeze. Touching something that recently got the virus on it (is contaminated) and then touching your mouth, nose, or eyes. How can this condition affect me? If you get the flu, your  friends, family, and co-workers are also at risk of getting it because the flu spreads easily to others. Having the flu can lead to complications, such as pneumonia, ear infection, and sinus infection. The flu also can be life-threatening, especially for babies, people older than age 42, and people who have serious long-term diseases. You can protect yourself and other people by: Keeping your body's defense system (immune system) strong. Getting a flu shot, or influenza vaccination, each year. Practicing good health habits. What can increase my risk? The following factors may make you more likely to get the flu: Not washing or sanitizing your hands often. Having close contact with many people during cold and flu season. Touching your mouth, eyes, or nose without first washing or sanitizing your hands. Not getting a flu shot each year. Working in health care. You may be at risk for more serious flu symptoms if: You are older than age 73. You are pregnant. Your immune system is weak. You have a condition that makes the flu worse or life-threatening. You have severe obesity. What actions can I take to prevent this? Lifestyle You can lower your risk of getting the flu by keeping your immune system in good shape. Do this by: Eating a healthy diet. Drinking plenty of fluids. Drink enough fluid to keep your urine pale yellow. Getting enough sleep. Exercising regularly. Do not use any products that contain nicotine or tobacco. These products include cigarettes, chewing tobacco, and vaping devices, such as e-cigarettes. If you need help quitting, ask your health care provider. Medicines Getting a flu shot every year can lower your risk of getting the flu. This is the best way to prevent the flu. A flu shot is recommended for everyone age 62 months or older. It is best to get a flu shot in the fall, as soon as it is available. But getting a flu shot during winter or spring instead is still a good  idea. Flu season can last into early spring. Preventing the flu through vaccination means getting a new flu shot every year. This is because the flu virus changes slightly (mutates) from one year to the next. The flu shot does not completely protect you from all flu virus mutations. If you get the flu after you get the shot, the shot can make your illness milder and go away sooner. It also can prevent dangerous complications of the flu. If you are pregnant, you can and should get a flu shot. Check with your health care provider before getting a flu shot if you have had a reaction to the shot in the past or if you are allergic to eggs. Sometimes the vaccine is available as a nasal spray. In some years, the nasal spray has not been as effective against the flu virus. Check with your health care provider if you have questions about this. Most people recover from the  flu by resting at home and drinking plenty of fluids. However, a prescription antiviral medicine may reduce your flu symptoms and may make your flu go away sooner. This medicine must be started within a few days of getting flu symptoms. Antiviral medicine may be prescribed for people who are at risk for more serious flu symptoms. Talk with your health care provider about whether you need an antiviral medicine.  General information     You can also lower your risk of getting the flu by practicing good health habits. This is especially important during flu season. Avoid contact with people who are sick with flu or cold symptoms. Wash your hands with soap and water often and for at least 20 seconds. If soap and water are not available, use alcohol-based hand sanitizer. Avoid touching your hands to your face, especially when you have not washed your hands recently. Use a cleanser that kills germs (a disinfectant) to clean surfaces at home and at work that may have the flu virus on them. If you do get the flu, avoid spreading it to others  by: Staying home until your symptoms, including your fever, have been gone for at least 24 hours. Covering your mouth and nose when you cough or sneeze. Avoiding close contact with others, especially babies and older people.  Where to find more information Centers for Disease Control and Prevention: http://www.wolf.info/ American Academy of Family Physicians: www.familydoctor.org Contact a health care provider if: You have the flu and you develop new symptoms. You have diarrhea. You have a fever. Your cough gets worse, or you produce more mucus. Get help right away if you: Have trouble breathing. Have chest pain. These symptoms may represent a serious problem that is an emergency. Do not wait to see if the symptoms will go away. Get medical help right away. Call your local emergency services (911 in the [place] Do not drive yourself to the hospital. Summary The best way to prevent the flu is to get a flu shot (influenza vaccination) every year in the fall. Even if you get the flu after you have received the yearly flu shot, your flu may be milder and go away sooner because of your flu shot. If you get the flu, antiviral medicines that are started within a few days of flu symptoms may reduce your symptoms and reduce how long the flu lasts. You can also help prevent the flu by practicing good health habits. This information is not intended to replace advice given to you by your health care provider. Make sure you discuss any questions you have with your health care provider. Document Revised: 12/25/2019 Document Reviewed: 12/25/2019 Elsevier Patient Education  [place].    If you have been instructed to have an in-person evaluation today at a local Urgent Care facility, please use the link below. It will take you to a list of all of our available [facility], including address, phone number and hours of operation. Please do not delay care.  [facility]  If you or  a family member do not have a primary care provider, use the link below to schedule a visit and establish care. When you choose a [facility] primary care physician or advanced practice provider, you gain a long-term partner in health. Find a Primary Care Provider  Learn more about [facility]'s in-office and virtual care options: [facility]

## 2022-05-29 ENCOUNTER — Other Ambulatory Visit: Payer: Self-pay

## 2022-05-29 ENCOUNTER — Other Ambulatory Visit (HOSPITAL_COMMUNITY): Payer: Self-pay

## 2022-06-04 DIAGNOSIS — H35033 Hypertensive retinopathy, bilateral: Secondary | ICD-10-CM | POA: Diagnosis not present

## 2022-06-04 DIAGNOSIS — H524 Presbyopia: Secondary | ICD-10-CM | POA: Diagnosis not present

## 2022-06-04 DIAGNOSIS — H43393 Other vitreous opacities, bilateral: Secondary | ICD-10-CM | POA: Diagnosis not present

## 2022-06-04 LAB — HM DIABETES EYE EXAM

## 2022-06-05 ENCOUNTER — Encounter: Payer: Self-pay | Admitting: Internal Medicine

## 2022-06-05 NOTE — Progress Notes (Signed)
Subjective:    Patient ID: Cheryl Chan, female    DOB: August 06, 1955, 67 y.o.   MRN: [id number]      HPI Cheryl Chan is here for a Physical exam.   Trying to get a good amount of protein.  Taking calcium and vitamin D  Had arthritis in hands, they lock up at times, probably back and neck.  Worse early in the day.  No swelling. Taking tylenol.     Medications and allergies reviewed with patient and updated if appropriate.  Current Outpatient Medications on File Prior to Visit  Medication Sig Dispense Refill   Continuous Blood Gluc Receiver (DEXCOM G7 RECEIVER) DEVI Use as directed to check sugars. 1 each 0   Continuous Blood Gluc Sensor (DEXCOM G7 SENSOR) MISC Use as directed to monitor sugars. 3 each 5   dapagliflozin propanediol (FARXIGA) 10 MG TABS tablet Take 1 tablet (10 mg total) by mouth daily before breakfast. 90 tablet 1   Esomeprazole Magnesium 20 MG TBEC      Evolocumab (REPATHA SURECLICK) 528 MG/ML SOAJ Inject 1 Dose into the skin every 14 (fourteen) days. 6 mL 3   fluticasone (FLONASE) 50 MCG/ACT nasal spray Place 2 sprays into both nostrils daily. 48 g 0   glucose blood (FREESTYLE LITE) test strip Test 3 times daily as needed 100 strip 12   Lancets (FREESTYLE) lancets Use as instructed 100 each 12   levothyroxine (SYNTHROID) 88 MCG tablet Take 1 tablet (88 mcg total) by mouth daily before breakfast. 90 tablet 0   loperamide (IMODIUM) 2 MG capsule      sertraline (ZOLOFT) 100 MG tablet Take 1 tablet (100 mg total) by mouth daily. 90 tablet 3   tirzepatide (MOUNJARO) 5 MG/0.5ML Pen Inject 5 mg into the skin once a week. 6 mL 2   Turmeric 500 MG CAPS Take by mouth.     No current facility-administered medications on file prior to visit.    Review of Systems  Constitutional:  Negative for fever.  HENT:  Positive for hearing loss.   Eyes:  Positive for visual disturbance (has had one occular migraine).  Respiratory:  Negative for cough, shortness of breath and  wheezing.   Cardiovascular:  Negative for chest pain, palpitations and leg swelling.  Gastrointestinal:  Positive for constipation (mild - taking fiber) and nausea (mild iwth mounjaro). Negative for abdominal pain, blood in stool and diarrhea.       Occ gerd  Genitourinary:  Negative for dysuria.  Musculoskeletal:  Positive for arthralgias and back pain.  Skin:  Negative for rash.  Neurological:  Positive for headaches (occ). Negative for light-headedness.  Psychiatric/Behavioral:  Positive for dysphoric mood. The patient is not nervous/anxious.        Objective:   Vitals:   06/06/22 0830  BP: 106/68  Pulse: 76  Temp: 97.9 F (36.6 C)  SpO2: 99%   Filed Weights   06/06/22 0830  Weight: 128 lb (58.1 kg)   Body mass index is 23.41 kg/m.  BP Readings from Last 3 Encounters:  06/06/22 106/68  12/04/21 118/70  06/27/21 126/68    Wt Readings from Last 3 Encounters:  06/06/22 128 lb (58.1 kg)  12/04/21 130 lb (59 kg)  06/27/21 145 lb 6.4 oz (66 kg)       Physical Exam Constitutional: She appears well-developed and well-nourished. No distress.  HENT:  Head: Normocephalic and atraumatic.  Right Ear: External ear normal. Normal ear canal and TM Left Ear: External ear  normal.  Normal ear canal and TM Mouth/Throat: Oropharynx is clear and moist.  Eyes: Conjunctivae normal.  Neck: Neck supple. No tracheal deviation present. No thyromegaly present.  No carotid bruit  Cardiovascular: Normal rate, regular rhythm and normal heart sounds.   No murmur heard.  No edema. Pulmonary/Chest: Effort normal and breath sounds normal. No respiratory distress. She has no wheezes. She has no rales.  Breast: deferred   Abdominal: Soft. She exhibits no distension. There is no tenderness.  Lymphadenopathy: She has no cervical adenopathy.  Skin: Skin is warm and dry. She is not diaphoretic.  Psychiatric: She has a normal mood and affect. Her behavior is normal.     Lab Results   Component Value Date   WBC 4.8 12/01/2021   HGB 13.6 12/01/2021   HCT 41.6 12/01/2021   PLT 247.0 12/01/2021   GLUCOSE 114 (H) 12/01/2021   CHOL 188 12/01/2021   TRIG 105.0 12/01/2021   HDL 59.90 12/01/2021   LDLDIRECT 195.0 06/12/2019   LDLCALC 107 (H) 12/01/2021   ALT 11 12/01/2021   AST 16 12/01/2021   NA 139 12/01/2021   K 3.7 12/01/2021   CL 103 12/01/2021   CREATININE 0.70 12/01/2021   BUN 26 (H) 12/01/2021   CO2 29 12/01/2021   TSH 3.59 12/01/2021   HGBA1C 6.3 12/01/2021   MICROALBUR 1.0 05/25/2021         Assessment & Plan:   Physical exam: Screening blood work  ordered Exercise  active- encouraged regular exercise Weight  normal Substance abuse  none   Reviewed recommended immunizations.   Health Maintenance  Topic Date Due   FOOT EXAM  09/25/2017   OPHTHALMOLOGY EXAM  06/05/2019   Pneumonia Vaccine 42+ Years old (2 - PCV) 05/19/2021   Diabetic kidney evaluation - Urine ACR  05/25/2022   HEMOGLOBIN A1C  06/03/2022   COVID-19 Vaccine (7 - 2023-24 season) 06/22/2022 (Originally 05/04/2022)   DTaP/Tdap/Td (3 - Td or Tdap) 08/04/2022   Diabetic kidney evaluation - eGFR measurement  12/02/2022   MAMMOGRAM  10/13/2023   DEXA SCAN  10/12/2024   COLONOSCOPY (Pts 45-77yr Insurance coverage will need to be confirmed)  07/10/2026   INFLUENZA VACCINE  Completed   Hepatitis C Screening  Completed   Zoster Vaccines- Shingrix  Completed   HPV VACCINES  Aged Out          See Problem List for Assessment and Plan of chronic medical problems.

## 2022-06-05 NOTE — Patient Instructions (Addendum)
Blood work was ordered.   The lab is on the first floor.    Medications changes include :   None    Return in about 6 months (around 12/05/2022) for follow up.    Health Maintenance, Female Adopting a healthy lifestyle and getting preventive care are important in promoting health and wellness. Ask your health care provider about: The right schedule for you to have regular tests and exams. Things you can do on your own to prevent diseases and keep yourself healthy. What should I know about diet, weight, and exercise? Eat a healthy diet  Eat a diet that includes plenty of vegetables, fruits, low-fat dairy products, and lean protein. Do not eat a lot of foods that are high in solid fats, added sugars, or sodium. Maintain a healthy weight Body mass index (BMI) is used to identify weight problems. It estimates body fat based on height and weight. Your health care provider can help determine your BMI and help you achieve or maintain a healthy weight. Get regular exercise Get regular exercise. This is one of the most important things you can do for your health. Most adults should: Exercise for at least 150 minutes each week. The exercise should increase your heart rate and make you sweat (moderate-intensity exercise). Do strengthening exercises at least twice a week. This is in addition to the moderate-intensity exercise. Spend less time sitting. Even light physical activity can be beneficial. Watch cholesterol and blood lipids Have your blood tested for lipids and cholesterol at 67 years of age, then have this test every 5 years. Have your cholesterol levels checked more often if: Your lipid or cholesterol levels are high. You are older than 67 years of age. You are at high risk for heart disease. What should I know about cancer screening? Depending on your health history and family history, you may need to have cancer screening at various ages. This may include screening  for: Breast cancer. Cervical cancer. Colorectal cancer. Skin cancer. Lung cancer. What should I know about heart disease, diabetes, and high blood pressure? Blood pressure and heart disease High blood pressure causes heart disease and increases the risk of stroke. This is more likely to develop in people who have high blood pressure readings or are overweight. Have your blood pressure checked: Every 3-5 years if you are 57-91 years of age. Every year if you are 27 years old or older. Diabetes Have regular diabetes screenings. This checks your fasting blood sugar level. Have the screening done: Once every three years after age 26 if you are at a normal weight and have a low risk for diabetes. More often and at a younger age if you are overweight or have a high risk for diabetes. What should I know about preventing infection? Hepatitis B If you have a higher risk for hepatitis B, you should be screened for this virus. Talk with your health care provider to find out if you are at risk for hepatitis B infection. Hepatitis C Testing is recommended for: Everyone born from [birth year] through 1965. Anyone with known risk factors for hepatitis C. Sexually transmitted infections (STIs) Get screened for STIs, including gonorrhea and chlamydia, if: You are sexually active and are younger than 67 years of age. You are older than 67 years of age and your health care provider tells you that you are at risk for this type of infection. Your sexual activity has changed since you were last screened, and you are at  increased risk for chlamydia or gonorrhea. Ask your health care provider if you are at risk. Ask your health care provider about whether you are at high risk for HIV. Your health care provider may recommend a prescription medicine to help prevent HIV infection. If you choose to take medicine to prevent HIV, you should first get tested for HIV. You should then be tested every 3 months for as long as you  are taking the medicine. Pregnancy If you are about to stop having your period (premenopausal) and you may become pregnant, seek counseling before you get pregnant. Take 400 to 800 micrograms (mcg) of folic acid every day if you become pregnant. Ask for birth control (contraception) if you want to prevent pregnancy. Osteoporosis and menopause Osteoporosis is a disease in which the bones lose minerals and strength with aging. This can result in bone fractures. If you are 62 years old or older, or if you are at risk for osteoporosis and fractures, ask your health care provider if you should: Be screened for bone loss. Take a calcium or vitamin D supplement to lower your risk of fractures. Be given hormone replacement therapy (HRT) to treat symptoms of menopause. Follow these instructions at home: Alcohol use Do not drink alcohol if: Your health care provider tells you not to drink. You are pregnant, may be pregnant, or are planning to become pregnant. If you drink alcohol: Limit how much you have to: 0-1 drink a day. Know how much alcohol is in your drink. In the [place] one drink equals one 12 oz bottle of beer (355 mL), one 5 oz glass of wine (148 mL), or one 1 oz glass of hard liquor (44 mL). Lifestyle Do not use any products that contain nicotine or tobacco. These products include cigarettes, chewing tobacco, and vaping devices, such as e-cigarettes. If you need help quitting, ask your health care provider. Do not use street drugs. Do not share needles. Ask your health care provider for help if you need support or information about quitting drugs. General instructions Schedule regular health, dental, and eye exams. Stay current with your vaccines. Tell your health care provider if: You often feel depressed. You have ever been abused or do not feel safe at home. Summary Adopting a healthy lifestyle and getting preventive care are important in promoting health and wellness. Follow your  health care provider's instructions about healthy diet, exercising, and getting tested or screened for diseases. Follow your health care provider's instructions on monitoring your cholesterol and blood pressure. This information is not intended to replace advice given to you by your health care provider. Make sure you discuss any questions you have with your health care provider. Document Revised: 09/26/2020 Document Reviewed: 09/26/2020 Elsevier Patient Education  [employer].

## 2022-06-06 ENCOUNTER — Ambulatory Visit (INDEPENDENT_AMBULATORY_CARE_PROVIDER_SITE_OTHER): Payer: 59 | Admitting: Internal Medicine

## 2022-06-06 VITALS — BP 106/68 | HR 76 | Temp 97.9°F | Ht 62.0 in | Wt 128.0 lb

## 2022-06-06 DIAGNOSIS — E119 Type 2 diabetes mellitus without complications: Secondary | ICD-10-CM | POA: Diagnosis not present

## 2022-06-06 DIAGNOSIS — Z0000 Encounter for general adult medical examination without abnormal findings: Secondary | ICD-10-CM | POA: Diagnosis not present

## 2022-06-06 DIAGNOSIS — F3289 Other specified depressive episodes: Secondary | ICD-10-CM

## 2022-06-06 DIAGNOSIS — M85852 Other specified disorders of bone density and structure, left thigh: Secondary | ICD-10-CM | POA: Diagnosis not present

## 2022-06-06 DIAGNOSIS — E782 Mixed hyperlipidemia: Secondary | ICD-10-CM | POA: Diagnosis not present

## 2022-06-06 DIAGNOSIS — E039 Hypothyroidism, unspecified: Secondary | ICD-10-CM

## 2022-06-06 DIAGNOSIS — M2550 Pain in unspecified joint: Secondary | ICD-10-CM

## 2022-06-06 DIAGNOSIS — E559 Vitamin D deficiency, unspecified: Secondary | ICD-10-CM | POA: Diagnosis not present

## 2022-06-06 NOTE — Assessment & Plan Note (Addendum)
Chronic DEXA up-to-date Encouraged regular exercise Taking calcium and vitamin D-gets some of her calcium from the multivitamin and the rest from food

## 2022-06-06 NOTE — Assessment & Plan Note (Signed)
Chronic Controlled, Stable Continue sertraline 100 mg daily

## 2022-06-06 NOTE — Assessment & Plan Note (Addendum)
Chronic   Lab Results  Component Value Date   HGBA1C 6.3 12/01/2021   Sugars well controlled Check A1c, urine microalbumin today Continue Mounjaro 5 mg weekly, Farxiga 10 mg daily Doing well with Mounjaro-weight is stable.  Stressed making sure she is getting enough protein and exercising regularly to maintain good muscle mass Stressed diabetic diet

## 2022-06-06 NOTE — Assessment & Plan Note (Signed)
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

## 2022-06-06 NOTE — Assessment & Plan Note (Signed)
Chronic Regular exercise and healthy diet encouraged Check lipid panel  Continue Repatha 140 mg q. 14 days

## 2022-06-06 NOTE — Assessment & Plan Note (Addendum)
Chronic Taking vitamin D daily 

## 2022-06-06 NOTE — Assessment & Plan Note (Signed)
Chronic  Clinically euthyroid Check tsh and will titrate med dose if needed Currently taking levothyroxine 88 mcg daily

## 2022-06-08 ENCOUNTER — Other Ambulatory Visit (INDEPENDENT_AMBULATORY_CARE_PROVIDER_SITE_OTHER): Payer: 59

## 2022-06-08 DIAGNOSIS — E119 Type 2 diabetes mellitus without complications: Secondary | ICD-10-CM | POA: Diagnosis not present

## 2022-06-08 DIAGNOSIS — E782 Mixed hyperlipidemia: Secondary | ICD-10-CM | POA: Diagnosis not present

## 2022-06-08 DIAGNOSIS — E039 Hypothyroidism, unspecified: Secondary | ICD-10-CM

## 2022-06-08 DIAGNOSIS — M2550 Pain in unspecified joint: Secondary | ICD-10-CM

## 2022-06-08 LAB — CBC WITH DIFFERENTIAL/PLATELET
Basophils Absolute: 0 10*3/uL (ref 0.0–0.1)
Basophils Relative: 0.7 % (ref 0.0–3.0)
Eosinophils Absolute: 0.1 10*3/uL (ref 0.0–0.7)
Eosinophils Relative: 1.9 % (ref 0.0–5.0)
HCT: 42.3 % (ref 36.0–46.0)
Hemoglobin: 14 g/dL (ref 12.0–15.0)
Lymphocytes Relative: 34.7 % (ref 12.0–46.0)
Lymphs Abs: 1.5 10*3/uL (ref 0.7–4.0)
MCHC: 33 g/dL (ref 30.0–36.0)
MCV: 89.3 fl (ref 78.0–100.0)
Monocytes Absolute: 0.4 10*3/uL (ref 0.1–1.0)
Monocytes Relative: 9.8 % (ref 3.0–12.0)
Neutro Abs: 2.3 10*3/uL (ref 1.4–7.7)
Neutrophils Relative %: 52.9 % (ref 43.0–77.0)
Platelets: 250 10*3/uL (ref 150.0–400.0)
RBC: 4.74 Mil/uL (ref 3.87–5.11)
RDW: 14.1 % (ref 11.5–15.5)
WBC: 4.3 10*3/uL (ref 4.0–10.5)

## 2022-06-08 LAB — MICROALBUMIN / CREATININE URINE RATIO
Creatinine,U: 138.8 mg/dL
Microalb Creat Ratio: 0.6 mg/g (ref 0.0–30.0)
Microalb, Ur: 0.9 mg/dL (ref 0.0–1.9)

## 2022-06-08 LAB — COMPREHENSIVE METABOLIC PANEL
ALT: 10 U/L (ref 0–35)
AST: 15 U/L (ref 0–37)
Albumin: 4.3 g/dL (ref 3.5–5.2)
Alkaline Phosphatase: 51 U/L (ref 39–117)
BUN: 20 mg/dL (ref 6–23)
CO2: 29 mEq/L (ref 19–32)
Calcium: 8.9 mg/dL (ref 8.4–10.5)
Chloride: 103 mEq/L (ref 96–112)
Creatinine, Ser: 0.7 mg/dL (ref 0.40–1.20)
GFR: 90.35 mL/min (ref >60.00)
Glucose, Bld: 99 mg/dL (ref 70–99)
Potassium: 3.6 mEq/L (ref 3.5–5.1)
Sodium: 142 mEq/L (ref 135–145)
Total Bilirubin: 0.7 mg/dL (ref 0.2–1.2)
Total Protein: 7.4 g/dL (ref 6.0–8.3)

## 2022-06-08 LAB — C-REACTIVE PROTEIN: CRP: <1 mg/dL (ref 0.5–20.0)

## 2022-06-08 LAB — LIPID PANEL
Cholesterol: 222 mg/dL — ABNORMAL HIGH (ref 0–200)
HDL: 67.8 mg/dL (ref >39.00)
LDL Cholesterol: 133 mg/dL — ABNORMAL HIGH (ref 0–99)
NonHDL: 154.04
Total CHOL/HDL Ratio: 3
Triglycerides: 106 mg/dL (ref 0.0–149.0)
VLDL: 21.2 mg/dL (ref 0.0–40.0)

## 2022-06-08 LAB — SEDIMENTATION RATE: Sed Rate: 16 mm/hr (ref 0–30)

## 2022-06-08 LAB — TSH: TSH: 3.32 u[IU]/mL (ref 0.35–5.50)

## 2022-06-08 LAB — HEMOGLOBIN A1C: Hgb A1c MFr Bld: 6.4 % (ref 4.6–6.5)

## 2022-06-12 LAB — ANTI-NUCLEAR AB-TITER (ANA TITER): ANA Titer 1: 1:40 {titer} — ABNORMAL HIGH

## 2022-06-12 LAB — CYCLIC CITRUL PEPTIDE ANTIBODY, IGG: Cyclic Citrullin Peptide Ab: <16 UNITS

## 2022-06-12 LAB — ANA: Anti Nuclear Antibody (ANA): POSITIVE — AB

## 2022-06-12 LAB — RHEUMATOID FACTOR: Rheumatoid fact SerPl-aCnc: <14 IU/mL (ref <14)

## 2022-06-14 ENCOUNTER — Encounter: Payer: Self-pay | Admitting: Internal Medicine

## 2022-06-14 NOTE — Progress Notes (Signed)
Outside notes received. Information abstracted. Notes sent to scan. 

## 2022-06-18 ENCOUNTER — Other Ambulatory Visit: Payer: Self-pay | Admitting: Internal Medicine

## 2022-06-18 ENCOUNTER — Other Ambulatory Visit (HOSPITAL_COMMUNITY): Payer: Self-pay

## 2022-06-19 ENCOUNTER — Other Ambulatory Visit (HOSPITAL_COMMUNITY): Payer: Self-pay

## 2022-06-19 MED ORDER — LEVOTHYROXINE SODIUM 88 MCG PO TABS
88.0000 ug | ORAL_TABLET | Freq: Every day | ORAL | 3 refills | Status: DC
  Filled 2022-06-19: qty 90, 90d supply, fill #0
  Filled 2022-09-19: qty 90, 90d supply, fill #1
  Filled 2022-12-23: qty 90, 90d supply, fill #2
  Filled 2023-03-25: qty 90, 90d supply, fill #3

## 2022-07-16 ENCOUNTER — Other Ambulatory Visit (HOSPITAL_COMMUNITY): Payer: Self-pay

## 2022-07-16 ENCOUNTER — Other Ambulatory Visit: Payer: Self-pay | Admitting: Internal Medicine

## 2022-07-17 ENCOUNTER — Other Ambulatory Visit (HOSPITAL_COMMUNITY): Payer: Self-pay

## 2022-07-17 ENCOUNTER — Other Ambulatory Visit: Payer: Self-pay

## 2022-07-17 MED ORDER — DAPAGLIFLOZIN PROPANEDIOL 10 MG PO TABS
10.0000 mg | ORAL_TABLET | Freq: Every day | ORAL | 1 refills | Status: DC
  Filled 2022-07-17: qty 90, 90d supply, fill #0
  Filled 2022-10-17 – 2022-10-18 (×2): qty 90, 90d supply, fill #1

## 2022-07-20 ENCOUNTER — Other Ambulatory Visit (HOSPITAL_COMMUNITY): Payer: Self-pay

## 2022-07-24 ENCOUNTER — Other Ambulatory Visit (HOSPITAL_COMMUNITY): Payer: Self-pay

## 2022-08-14 ENCOUNTER — Other Ambulatory Visit (HOSPITAL_COMMUNITY): Payer: Self-pay

## 2022-08-14 MED ORDER — CELECOXIB 200 MG PO CAPS
200.0000 mg | ORAL_CAPSULE | Freq: Two times a day (BID) | ORAL | 1 refills | Status: DC
  Filled 2022-08-14: qty 60, 30d supply, fill #0
  Filled 2022-09-19: qty 60, 30d supply, fill #1

## 2022-08-21 ENCOUNTER — Other Ambulatory Visit: Payer: Self-pay | Admitting: Internal Medicine

## 2022-08-22 ENCOUNTER — Other Ambulatory Visit: Payer: Self-pay

## 2022-08-22 ENCOUNTER — Other Ambulatory Visit (HOSPITAL_COMMUNITY): Payer: Self-pay

## 2022-08-22 MED ORDER — MOUNJARO 5 MG/0.5ML ~~LOC~~ SOAJ
5.0000 mg | SUBCUTANEOUS | 2 refills | Status: DC
  Filled 2022-08-22: qty 2, 28d supply, fill #0
  Filled 2022-08-22: qty 6, 84d supply, fill #0
  Filled 2022-09-19 – 2022-10-17 (×3): qty 2, 28d supply, fill #1

## 2022-08-28 ENCOUNTER — Other Ambulatory Visit (HOSPITAL_COMMUNITY): Payer: Self-pay

## 2022-08-30 ENCOUNTER — Encounter: Payer: Self-pay | Admitting: Internal Medicine

## 2022-08-30 DIAGNOSIS — Z136 Encounter for screening for cardiovascular disorders: Secondary | ICD-10-CM

## 2022-09-10 ENCOUNTER — Other Ambulatory Visit: Payer: Self-pay | Admitting: Internal Medicine

## 2022-09-10 ENCOUNTER — Other Ambulatory Visit (HOSPITAL_COMMUNITY): Payer: Self-pay

## 2022-09-10 MED ORDER — REPATHA SURECLICK 140 MG/ML ~~LOC~~ SOAJ
SUBCUTANEOUS | 3 refills | Status: DC
  Filled 2022-09-10: qty 6, 84d supply, fill #0
  Filled 2022-10-17 – 2022-12-23 (×3): qty 6, 84d supply, fill #1
  Filled 2023-04-07 – 2023-04-16 (×5): qty 6, 84d supply, fill #2
  Filled 2023-04-22 (×2): qty 2, 28d supply, fill #2
  Filled 2023-05-23: qty 2, 28d supply, fill #3
  Filled 2023-06-29: qty 2, 28d supply, fill #4
  Filled 2023-07-31: qty 2, 28d supply, fill #5
  Filled 2023-08-26: qty 2, 28d supply, fill #6

## 2022-09-12 ENCOUNTER — Other Ambulatory Visit (HOSPITAL_COMMUNITY): Payer: Self-pay

## 2022-09-19 ENCOUNTER — Other Ambulatory Visit (HOSPITAL_COMMUNITY): Payer: Self-pay

## 2022-09-19 ENCOUNTER — Ambulatory Visit (HOSPITAL_COMMUNITY)
Admission: RE | Admit: 2022-09-19 | Discharge: 2022-09-19 | Disposition: A | Discharge status: Home / Self Care | Payer: Self-pay | Source: Ambulatory Visit | Attending: Internal Medicine | Admitting: Internal Medicine

## 2022-09-19 DIAGNOSIS — Z136 Encounter for screening for cardiovascular disorders: Secondary | ICD-10-CM | POA: Insufficient documentation

## 2022-09-22 ENCOUNTER — Encounter: Payer: Self-pay | Admitting: Internal Medicine

## 2022-09-22 DIAGNOSIS — R931 Abnormal findings on diagnostic imaging of heart and coronary circulation: Secondary | ICD-10-CM | POA: Insufficient documentation

## 2022-09-24 ENCOUNTER — Other Ambulatory Visit (HOSPITAL_COMMUNITY): Payer: Self-pay

## 2022-09-24 ENCOUNTER — Other Ambulatory Visit: Payer: Self-pay

## 2022-09-24 ENCOUNTER — Other Ambulatory Visit: Payer: Self-pay | Admitting: Internal Medicine

## 2022-09-24 MED ORDER — TIRZEPATIDE 7.5 MG/0.5ML ~~LOC~~ SOAJ
7.5000 mg | SUBCUTANEOUS | 0 refills | Status: DC
  Filled 2022-09-24: qty 6, 84d supply, fill #0
  Filled 2022-09-24: qty 2, 28d supply, fill #0
  Filled 2022-10-17 – 2022-10-18 (×2): qty 2, 28d supply, fill #1
  Filled 2022-11-23: qty 2, 28d supply, fill #2

## 2022-09-25 ENCOUNTER — Other Ambulatory Visit (HOSPITAL_COMMUNITY): Payer: Self-pay

## 2022-10-18 ENCOUNTER — Other Ambulatory Visit (HOSPITAL_COMMUNITY): Payer: Self-pay

## 2022-11-05 DIAGNOSIS — M19031 Primary osteoarthritis, right wrist: Secondary | ICD-10-CM | POA: Diagnosis not present

## 2022-11-06 ENCOUNTER — Other Ambulatory Visit (HOSPITAL_COMMUNITY): Payer: Self-pay

## 2022-11-06 ENCOUNTER — Encounter (HOSPITAL_BASED_OUTPATIENT_CLINIC_OR_DEPARTMENT_OTHER): Payer: Self-pay | Admitting: Internal Medicine

## 2022-11-06 ENCOUNTER — Ambulatory Visit (HOSPITAL_BASED_OUTPATIENT_CLINIC_OR_DEPARTMENT_OTHER): Payer: PPO | Admitting: Internal Medicine

## 2022-11-06 VITALS — BP 115/65 | HR 79 | Ht 62.0 in | Wt 124.0 lb

## 2022-11-06 DIAGNOSIS — E119 Type 2 diabetes mellitus without complications: Secondary | ICD-10-CM | POA: Diagnosis not present

## 2022-11-06 DIAGNOSIS — Z7985 Long-term (current) use of injectable non-insulin antidiabetic drugs: Secondary | ICD-10-CM | POA: Diagnosis not present

## 2022-11-06 DIAGNOSIS — E7801 Familial hypercholesterolemia: Secondary | ICD-10-CM | POA: Diagnosis not present

## 2022-11-06 DIAGNOSIS — T466X5A Adverse effect of antihyperlipidemic and antiarteriosclerotic drugs, initial encounter: Secondary | ICD-10-CM

## 2022-11-06 DIAGNOSIS — M791 Myalgia, unspecified site: Secondary | ICD-10-CM

## 2022-11-06 DIAGNOSIS — Z7984 Long term (current) use of oral hypoglycemic drugs: Secondary | ICD-10-CM | POA: Diagnosis not present

## 2022-11-06 DIAGNOSIS — I2510 Atherosclerotic heart disease of native coronary artery without angina pectoris: Secondary | ICD-10-CM

## 2022-11-06 MED ORDER — EZETIMIBE 10 MG PO TABS
10.0000 mg | ORAL_TABLET | Freq: Every day | ORAL | 3 refills | Status: DC
  Filled 2022-11-06: qty 90, 90d supply, fill #0
  Filled 2023-01-30: qty 90, 90d supply, fill #1
  Filled 2023-05-23: qty 90, 90d supply, fill #2
  Filled 2023-08-26: qty 90, 90d supply, fill #3

## 2022-11-06 NOTE — Patient Instructions (Signed)
Medication Instructions:  START zetia 10mg  daily   *If you need a refill on your cardiac medications before your next appointment, please call your pharmacy*   Lab Work: FASTING work to check cholesterol in 3-4 months  If you have labs (blood work) drawn today and your tests are completely normal, you will receive your results only by: MyChart Message (if you have MyChart) OR A paper copy in the mail If you have any lab test that is abnormal or we need to change your treatment, we will call you to review the results.    Follow-Up: At [facility], you and your health needs are our priority.  As part of our continuing mission to provide you with exceptional heart care, we have created designated Provider Care Teams.  These Care Teams include your primary Cardiologist (physician) and Advanced Practice Providers (APPs -  Physician Assistants and Nurse Practitioners) who all work together to provide you with the care you need, when you need it.  We recommend signing up for the patient portal called "MyChart".  Sign up information is provided on this After Visit Summary.  MyChart is used to connect with patients for Virtual Visits (Telemedicine).  Patients are able to view lab/test results, encounter notes, upcoming appointments, etc.  Non-urgent messages can be sent to your provider as well.   To learn more about what you can do with MyChart, go to [email or web address].    Your next appointment:    3-4 months with Dr. Rennis Golden

## 2022-11-06 NOTE — Progress Notes (Signed)
LIPID CLINIC CONSULT NOTE  Chief Complaint:  Manage dyslipidemia  Primary Care Physician: Pincus Sanes, MD  Primary Cardiologist:  None  HPI:  Cheryl Chan is a 67 y.o. female who is being seen today for the evaluation of dyslipidemia at the request of Burns, Bobette Mo, MD.  This is a pleasant 67 year old female who is referred for evaluation management of dyslipidemia.  She reports a longstanding history of high cholesterol and has been statin intolerant for a number of years.  Despite significant exercise and dietary changes in the past she is not been able to get adequate control of her cholesterol.  She also reports that her mother had high cholesterol and that there is heart disease in her family.  Her brother was diagnosed in 2006 with a nonischemic cardiomyopathy ultimately died.  She underwent familial genetic testing with Dr. Jomarie Longs and was referred to Dr. Sampson Goon in our heart failure clinic to see if she possibly could be at risk for ARVC.  Ultimately she had a cardiac MRI in February 2020 which showed normal biventricular function.  He had a stress echo in 2011 at the direction of Dr. Delfin Edis which was negative for ischemia.  Her husband is a Teacher, early years/pre at the [employer].  She had several questions regarding her elevated cholesterol.  Incidentally her most recent lipid showed total cholesterol 285, triglycerides 220, HDL 55 and direct LDL 195.  Her LDL has been well over 200 in the past, suggestive of a familial hyperlipidemia.  Surprisingly, she has not had prior cardiovascular events that were aware of.  She currently denies any coronary symptoms.  02/19/2020  This is for is seen today in follow-up.  Overall she is doing well with Repatha.  She denies any significant side effects with that.  She has had an excellent response to Repatha with marked improvement in her cholesterol.  Total cholesterol is now 170, HDL 61, LDL 87 and triglycerides 126, improved  from a direct LDL of 195.  06/27/2021  Cheryl Chan returns today for routine follow-up.  Overall she seems to be doing well.  She is tolerating Repatha.  Her cholesterol has come down significantly although I would argue remains above target is a diabetic with some ASCVD (imaging evidence of atherosclerotic calcification of the aorta).  Her most recent lipid profile showed total cholesterol of 196, HDL 66, triglycerides 104 and LDL 109.  We discussed the possibility of adding ezetimibe however she felt that she was not necessarily 100% compliant with the medication and or diet but is improving and has lost 10 pounds recently.  11/06/2022  Cheryl Chan is seen today for follow-up with her husband who recently retired from [employer].  She has had an interval increase in her LDL cholesterol.  Recent labs showed total 222, triglycerides 106, HDL 67 and LDL 133, up from 107.  She continues on Repatha.  We have previously talked about possibility of additive therapy or ezetimibe however she wanted to work on medication compliance and continued weight loss.  Despite the fact that she has succeeded in additional weight loss with the addition of Mounjaro to her diet, her LDL is higher.  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"   Diet-controlled type 2 diabetes mellitus (HCC)    Endometrial cancer (HCC) 10/2011 dx   s/p  vag hyst 11/2011   History of chicken pox    Hyperlipidemia    Hypothyroidism    Positive TB test    CXRs ok   Rotator cuff injury    Shingles     Past Surgical History:  Procedure Laterality Date   ABDOMINAL HYSTERECTOMY     CESAREAN SECTION     x's 4   DILATION AND CURETTAGE OF UTERUS     hysteroscopy   FRACTURE SURGERY     nose as a child   OPEN REDUCTION INTERNAL FIXATION (ORIF) DISTAL PHALANX Left 08/05/2012   Procedure: OPEN REDUCTION INTERNAL FIXATION  (ORIF) DISTAL PHALANX VERSUS EXTERNAL FIXATION LEFT RING FINGER;  Surgeon: Tami Ribas, MD;  Location: [facility];  Service: Orthopedics;  Laterality: Left;   ROBOTIC ASSISTED LAP VAGINAL HYSTERECTOMY  12/04/2011   Procedure: ROBOTIC ASSISTED LAPAROSCOPIC VAGINAL HYSTERECTOMY;  Surgeon: Rejeana Brock A. Duard Brady, MD;  Location: [facility];  Service: Gynecology;  Laterality: N/A;   SHOULDER ARTHROSCOPY WITH ROTATOR CUFF REPAIR AND SUBACROMIAL DECOMPRESSION Left 11/27/2013   Procedure: LEFT SHOULDER ARTHROSCOPY, SUBACROMIAL DECOMPRESSION, PARTIAL ACROMIOPLASTY WITH  CORACOACROMIAL RELEASE, DISTAL CLAVICULECTOMY WITH ROTATOR CUFF REPAIR AND EXTENSIVE DEBRIDEMENT;  Surgeon: Nilda Simmer, MD;  Location: [facility];  Service: Orthopedics;  Laterality: Left;    FAMHx:  Family History  Problem Relation Age of Onset   Arthritis Mother    Cancer Father 89       prostrate   Hypertension Father    Stroke Father        in setting of sepsis f/ RMSF   Heart disease Brother 19       cardiomyopathy s/p ICD, on transplant list   Heart failure Brother    Heart failure Other     SOCHx:   reports that she has never smoked. She has never used smokeless tobacco. She reports that she does not currently use alcohol. She reports that she does not use drugs.  ALLERGIES:  Allergies  Allergen Reactions   Crestor [Rosuvastatin Calcium]     Muscle ache   Demerol [Meperidine]    Statins    Trulicity [Dulaglutide]     Nausea, diarrhea   Victoza [Liraglutide]     Nausea, diarrhea   Metformin And Related     diarrhea    ROS: Pertinent items noted in HPI and remainder of comprehensive ROS otherwise negative.  HOME MEDS: Current Outpatient Medications on File Prior to Visit  Medication Sig Dispense Refill   celecoxib (CELEBREX) 200 MG capsule Take 200 mg by mouth daily.     Continuous Blood Gluc Receiver (DEXCOM G7 RECEIVER) DEVI Use as directed to check sugars. 1 each 0   Continuous  Blood Gluc Sensor (DEXCOM G7 SENSOR) MISC Use as directed to monitor sugars. 3 each 5   dapagliflozin propanediol (FARXIGA) 10 MG TABS tablet Take 1 tablet (10 mg total) by mouth daily before breakfast. 90 tablet 1   Evolocumab (REPATHA SURECLICK) 140 MG/ML SOAJ Inject 1 Dose into the skin every 14 (fourteen) days. 6 mL 3   fluticasone (FLONASE) 50 MCG/ACT nasal spray Place 2 sprays into both nostrils daily. 48 g 0   glucose blood (FREESTYLE LITE) test strip Test 3 times daily as needed 100 strip 12   Lancets (FREESTYLE) lancets Use as instructed 100 each 12   levothyroxine (SYNTHROID) 88 MCG tablet Take 1 tablet (88 mcg total) by mouth daily before breakfast. 90 tablet 3   sertraline (ZOLOFT) 100 MG tablet Take  1 tablet (100 mg total) by mouth daily. 90 tablet 3   tirzepatide (MOUNJARO) 7.5 MG/0.5ML Pen Inject 7.5 mg into the skin once a week. 6 mL 0   No current facility-administered medications on file prior to visit.    LABS/IMAGING: No results found for this or any previous visit (from the past 48 hour(s)). No results found.  LIPID PANEL:    Component Value Date/Time   CHOL 222 (H) 06/08/2022 0822   CHOL 170 11/24/2019 0923   TRIG 106.0 06/08/2022 0822   HDL 67.80 06/08/2022 0822   HDL 61 11/24/2019 0923   CHOLHDL 3 06/08/2022 0822   VLDL 21.2 06/08/2022 0822   LDLCALC 133 (H) 06/08/2022 0822   LDLCALC 87 11/24/2019 0923   LDLDIRECT 195.0 06/12/2019 0849    WEIGHTS: Wt Readings from Last 3 Encounters:  11/06/22 124 lb (56.2 kg)  06/06/22 128 lb (58.1 kg)  12/04/21 130 lb (59 kg)    VITALS: BP 115/65 (BP Location: Left Arm, Patient Position: Sitting, Cuff Size: Normal)   Pulse 79   Ht 5\' 2"  (1.575 m)   Wt 124 lb (56.2 kg)   LMP 05/22/2003   SpO2 99%   BMI 22.68 kg/m   EXAM: Deferred  EKG: Deferred  ASSESSMENT: Possible familial hyperlipidemia- Tedra Coupe Criteria Statin intolerance - myalgias, goal LDL less than 70 Type 2 diabetes. Atherosclerotic  aortic calcification  PLAN: 1.   Cheryl Chan had interval increases in her LDL cholesterol despite weight loss and at this point I think would likely need additional therapy as she has been compliant with the Repatha.  Ideally I would like to get her LDL to less than 70.  We discussed adding ezetimibe 10 mg daily to her regimen.  This may give her another 20 to 25% reduction.  She seems willing to give this a try.  She does have a history of statin intolerance.  Will plan repeat lipids in about 3 to 4 months and follow-up with me afterwards.  Chrystie Nose, MD, [facility], FACP  [facility]  Medical Director of the Advanced Lipid Disorders &  Cardiovascular Risk Reduction Clinic Diplomate of the American Board of Clinical Lipidology Attending Cardiologist  Direct Dial: [phone]  Fax: [phone]  Website:  [email or web address] Nicely Suzann Lazaro 11/06/2022, 1:15 PM

## 2022-11-21 DIAGNOSIS — M79641 Pain in right hand: Secondary | ICD-10-CM | POA: Diagnosis not present

## 2022-12-04 DIAGNOSIS — M79641 Pain in right hand: Secondary | ICD-10-CM | POA: Diagnosis not present

## 2022-12-11 ENCOUNTER — Ambulatory Visit: Payer: 59 | Admitting: Internal Medicine

## 2022-12-12 ENCOUNTER — Other Ambulatory Visit: Payer: Self-pay | Admitting: Internal Medicine

## 2022-12-13 ENCOUNTER — Other Ambulatory Visit (HOSPITAL_COMMUNITY): Payer: Self-pay

## 2022-12-13 MED ORDER — CELECOXIB 200 MG PO CAPS
200.0000 mg | ORAL_CAPSULE | Freq: Every day | ORAL | 1 refills | Status: DC
  Filled 2022-12-13: qty 90, 90d supply, fill #0
  Filled 2023-03-12: qty 90, 90d supply, fill #1

## 2022-12-17 ENCOUNTER — Encounter: Payer: Self-pay | Admitting: Internal Medicine

## 2022-12-17 NOTE — Progress Notes (Unsigned)
Subjective:    Patient ID: [name], female    DOB: 08-22-1955, 67 y.o.   MRN: [id number]     HPI Cheryl Chan is here for follow up of her chronic medical problems.  Sugar 120-124  exercising some - does yard work.  Walks dog.    Medications and allergies reviewed with patient and updated if appropriate.  Current Outpatient Medications on File Prior to Visit  Medication Sig Dispense Refill   celecoxib (CELEBREX) 200 MG capsule Take 1 capsule (200 mg total) by mouth daily. 90 capsule 1   Continuous Blood Gluc Receiver (DEXCOM G7 RECEIVER) DEVI Use as directed to check sugars. 1 each 0   Continuous Blood Gluc Sensor (DEXCOM G7 SENSOR) MISC Use as directed to monitor sugars. 3 each 5   dapagliflozin propanediol (FARXIGA) 10 MG TABS tablet Take 1 tablet (10 mg total) by mouth daily before breakfast. 90 tablet 1   Evolocumab (REPATHA SURECLICK) 140 MG/ML SOAJ Inject 1 Dose into the skin every 14 (fourteen) days. 6 mL 3   ezetimibe (ZETIA) 10 MG tablet Take 1 tablet (10 mg total) by mouth daily. 90 tablet 3   fluticasone (FLONASE) 50 MCG/ACT nasal spray Place 2 sprays into both nostrils daily. 48 g 0   glucose blood (FREESTYLE LITE) test strip Test 3 times daily as needed 100 strip 12   Lancets (FREESTYLE) lancets Use as instructed 100 each 12   levothyroxine (SYNTHROID) 88 MCG tablet Take 1 tablet (88 mcg total) by mouth daily before breakfast. 90 tablet 3   sertraline (ZOLOFT) 100 MG tablet Take 1 tablet (100 mg total) by mouth daily. 90 tablet 3   tirzepatide (MOUNJARO) 7.5 MG/0.5ML Pen Inject 7.5 mg into the skin once a week. 6 mL 0   No current facility-administered medications on file prior to visit.     Review of Systems  Constitutional:  Negative for fever.  Respiratory:  Negative for cough, shortness of breath and wheezing.   Cardiovascular:  Negative for chest pain, palpitations and leg swelling.  Neurological:  Negative for light-headedness and headaches.        Objective:   Vitals:   12/18/22 0805  BP: 112/80  Pulse: 75  Temp: 98.1 F (36.7 C)  SpO2: 98%   BP Readings from Last 3 Encounters:  12/18/22 112/80  11/06/22 115/65  06/06/22 106/68   Wt Readings from Last 3 Encounters:  12/18/22 123 lb 12.8 oz (56.2 kg)  11/06/22 124 lb (56.2 kg)  06/06/22 128 lb (58.1 kg)   Body mass index is 22.64 kg/m.    Physical Exam Constitutional:      General: She is not in acute distress.    Appearance: Normal appearance.  HENT:     Head: Normocephalic and atraumatic.  Eyes:     Conjunctiva/sclera: Conjunctivae normal.  Cardiovascular:     Rate and Rhythm: Normal rate and regular rhythm.     Heart sounds: Normal heart sounds.  Pulmonary:     Effort: Pulmonary effort is normal. No respiratory distress.     Breath sounds: Normal breath sounds. No wheezing.  Musculoskeletal:     Cervical back: Neck supple.     Right lower leg: No edema.     Left lower leg: No edema.  Lymphadenopathy:     Cervical: No cervical adenopathy.  Skin:    General: Skin is warm and dry.     Findings: No rash.  Neurological:     Mental Status: She  is alert. Mental status is at baseline.  Psychiatric:        Mood and Affect: Mood normal.        Behavior: Behavior normal.       Diabetic Foot Exam - Simple   Simple Foot Form Diabetic Foot exam was performed with the following findings: Yes 12/18/2022  8:44 AM  Visual Inspection No deformities, no ulcerations, no other skin breakdown bilaterally: Yes Sensation Testing Intact to touch and monofilament testing bilaterally: Yes Pulse Check Posterior Tibialis and Dorsalis pulse intact bilaterally: Yes Comments      Lab Results  Component Value Date   WBC 4.3 06/08/2022   HGB 14.0 06/08/2022   HCT 42.3 06/08/2022   PLT 250.0 06/08/2022   GLUCOSE 99 06/08/2022   CHOL 222 (H) 06/08/2022   TRIG 106.0 06/08/2022   HDL 67.80 06/08/2022   LDLDIRECT 195.0 06/12/2019   LDLCALC 133 (H) 06/08/2022   ALT 10  06/08/2022   AST 15 06/08/2022   NA 142 06/08/2022   K 3.6 06/08/2022   CL 103 06/08/2022   CREATININE 0.70 06/08/2022   BUN 20 06/08/2022   CO2 29 06/08/2022   TSH 3.32 06/08/2022   HGBA1C 6.4 06/08/2022   MICROALBUR 0.9 06/08/2022     Assessment & Plan:    See Problem List for Assessment and Plan of chronic medical problems.

## 2022-12-17 NOTE — Patient Instructions (Addendum)
      Blood work was ordered.   The lab is on the first floor.    Medications changes include :   none      Return in about 6 months (around 06/20/2023) for Physical Exam.

## 2022-12-18 ENCOUNTER — Other Ambulatory Visit (HOSPITAL_COMMUNITY): Payer: Self-pay

## 2022-12-18 ENCOUNTER — Ambulatory Visit (INDEPENDENT_AMBULATORY_CARE_PROVIDER_SITE_OTHER): Payer: PPO | Admitting: Internal Medicine

## 2022-12-18 VITALS — BP 112/80 | HR 75 | Temp 98.1°F | Ht 62.0 in | Wt 123.8 lb

## 2022-12-18 DIAGNOSIS — F3289 Other specified depressive episodes: Secondary | ICD-10-CM | POA: Diagnosis not present

## 2022-12-18 DIAGNOSIS — E782 Mixed hyperlipidemia: Secondary | ICD-10-CM

## 2022-12-18 DIAGNOSIS — E119 Type 2 diabetes mellitus without complications: Secondary | ICD-10-CM | POA: Diagnosis not present

## 2022-12-18 DIAGNOSIS — E559 Vitamin D deficiency, unspecified: Secondary | ICD-10-CM | POA: Diagnosis not present

## 2022-12-18 DIAGNOSIS — E039 Hypothyroidism, unspecified: Secondary | ICD-10-CM

## 2022-12-18 LAB — COMPREHENSIVE METABOLIC PANEL
ALT: 11 U/L (ref 0–35)
AST: 14 U/L (ref 0–37)
Albumin: 4.2 g/dL (ref 3.5–5.2)
Alkaline Phosphatase: 49 U/L (ref 39–117)
BUN: 25 mg/dL — ABNORMAL HIGH (ref 6–23)
CO2: 28 mEq/L (ref 19–32)
Calcium: 9.1 mg/dL (ref 8.4–10.5)
Chloride: 103 mEq/L (ref 96–112)
Creatinine, Ser: 0.62 mg/dL (ref 0.40–1.20)
GFR: 92.69 mL/min (ref >60.00)
Glucose, Bld: 115 mg/dL — ABNORMAL HIGH (ref 70–99)
Potassium: 3.6 mEq/L (ref 3.5–5.1)
Sodium: 139 mEq/L (ref 135–145)
Total Bilirubin: 0.9 mg/dL (ref 0.2–1.2)
Total Protein: 7.1 g/dL (ref 6.0–8.3)

## 2022-12-18 LAB — CBC WITH DIFFERENTIAL/PLATELET
Basophils Absolute: 0 10*3/uL (ref 0.0–0.1)
Basophils Relative: 0.6 % (ref 0.0–3.0)
Eosinophils Absolute: 0.1 10*3/uL (ref 0.0–0.7)
Eosinophils Relative: 2.4 % (ref 0.0–5.0)
HCT: 43 % (ref 36.0–46.0)
Hemoglobin: 13.9 g/dL (ref 12.0–15.0)
Lymphocytes Relative: 28.3 % (ref 12.0–46.0)
Lymphs Abs: 1.4 10*3/uL (ref 0.7–4.0)
MCHC: 32.4 g/dL (ref 30.0–36.0)
MCV: 91.6 fl (ref 78.0–100.0)
Monocytes Absolute: 0.6 10*3/uL (ref 0.1–1.0)
Monocytes Relative: 12.3 % — ABNORMAL HIGH (ref 3.0–12.0)
Neutro Abs: 2.8 10*3/uL (ref 1.4–7.7)
Neutrophils Relative %: 56.4 % (ref 43.0–77.0)
Platelets: 269 10*3/uL (ref 150.0–400.0)
RBC: 4.7 Mil/uL (ref 3.87–5.11)
RDW: 14.1 % (ref 11.5–15.5)
WBC: 5 10*3/uL (ref 4.0–10.5)

## 2022-12-18 LAB — HEMOGLOBIN A1C: Hgb A1c MFr Bld: 6 % (ref 4.6–6.5)

## 2022-12-18 LAB — LIPID PANEL
Cholesterol: 194 mg/dL (ref 0–200)
HDL: 69.6 mg/dL (ref >39.00)
LDL Cholesterol: 106 mg/dL — ABNORMAL HIGH (ref 0–99)
NonHDL: 124.69
Total CHOL/HDL Ratio: 3
Triglycerides: 93 mg/dL (ref 0.0–149.0)
VLDL: 18.6 mg/dL (ref 0.0–40.0)

## 2022-12-18 LAB — TSH: TSH: 1.84 u[IU]/mL (ref 0.35–5.50)

## 2022-12-18 LAB — VITAMIN D 25 HYDROXY (VIT D DEFICIENCY, FRACTURES): VITD: 33.51 ng/mL (ref 30.00–100.00)

## 2022-12-18 MED ORDER — SERTRALINE HCL 100 MG PO TABS
100.0000 mg | ORAL_TABLET | Freq: Every day | ORAL | 1 refills | Status: DC
  Filled 2022-12-18: qty 90, 90d supply, fill #0
  Filled 2023-04-23: qty 90, 90d supply, fill #1

## 2022-12-18 MED ORDER — DAPAGLIFLOZIN PROPANEDIOL 10 MG PO TABS
10.0000 mg | ORAL_TABLET | Freq: Every day | ORAL | 1 refills | Status: DC
  Filled 2022-12-18: qty 90, 90d supply, fill #0
  Filled 2023-04-23: qty 90, 90d supply, fill #1

## 2022-12-18 NOTE — Assessment & Plan Note (Signed)
Chronic Taking vitamin D daily-continue  

## 2022-12-18 NOTE — Assessment & Plan Note (Signed)
Chronic  Clinically euthyroid Check tsh and will titrate med dose if needed Currently taking levothyroxine 88 mcg daily

## 2022-12-18 NOTE — Assessment & Plan Note (Signed)
Chronic Regular exercise and healthy diet encouraged Check lipid panel  Continue Repatha 140 mg q. 14 days

## 2022-12-18 NOTE — Assessment & Plan Note (Signed)
Chronic   Lab Results  Component Value Date   HGBA1C 6.4 06/08/2022   Sugars well controlled Check A1c Continue Mounjaro 7.5 mg weekly, Farxiga 10 mg daily Doing well with Mounjaro-weight is stable.   Regular exercise advised Stressed diabetic diet

## 2022-12-18 NOTE — Assessment & Plan Note (Signed)
Chronic Controlled, Stable Continue sertraline 100 mg daily

## 2022-12-24 ENCOUNTER — Other Ambulatory Visit (HOSPITAL_COMMUNITY): Payer: Self-pay

## 2022-12-27 ENCOUNTER — Telehealth: Payer: Self-pay

## 2022-12-27 ENCOUNTER — Encounter: Payer: Self-pay | Admitting: Internal Medicine

## 2022-12-28 ENCOUNTER — Other Ambulatory Visit (HOSPITAL_COMMUNITY): Payer: Self-pay

## 2022-12-28 ENCOUNTER — Telehealth: Payer: Self-pay

## 2022-12-28 NOTE — Telephone Encounter (Signed)
Patient approved

## 2022-12-28 NOTE — Telephone Encounter (Signed)
Patient notified via mychart

## 2022-12-28 NOTE — Telephone Encounter (Signed)
Placed a call to [employer] due to a duplicate prior authorization claim via Cover My Meds.   Pharmacy Patient Advocate Encounter  Received notification from  [employer]  that Prior Authorization for Mounjaro 7.5mg /0.49ml has been APPROVED from 12/28/22 to 12/28/23   PA #/Case ID/Reference #: [id number]

## 2023-01-03 ENCOUNTER — Other Ambulatory Visit: Payer: Self-pay | Admitting: Internal Medicine

## 2023-01-03 ENCOUNTER — Other Ambulatory Visit (HOSPITAL_COMMUNITY): Payer: Self-pay

## 2023-01-03 MED ORDER — MOUNJARO 7.5 MG/0.5ML ~~LOC~~ SOAJ
7.5000 mg | SUBCUTANEOUS | 0 refills | Status: DC
  Filled 2023-01-03: qty 6, 84d supply, fill #0
  Filled 2023-01-03: qty 2, 28d supply, fill #0
  Filled 2023-01-30: qty 2, 28d supply, fill #1
  Filled 2023-02-28: qty 2, 28d supply, fill #2

## 2023-01-24 ENCOUNTER — Other Ambulatory Visit (HOSPITAL_COMMUNITY): Payer: Self-pay

## 2023-01-24 ENCOUNTER — Other Ambulatory Visit: Payer: Self-pay | Admitting: Internal Medicine

## 2023-01-24 MED ORDER — FREESTYLE LIBRE 3 SENSOR MISC
0 refills | Status: DC
  Filled 2023-01-24: qty 2, 30d supply, fill #0

## 2023-01-25 ENCOUNTER — Other Ambulatory Visit (HOSPITAL_COMMUNITY): Payer: Self-pay

## 2023-01-29 ENCOUNTER — Other Ambulatory Visit (HOSPITAL_COMMUNITY): Payer: Self-pay

## 2023-01-31 ENCOUNTER — Other Ambulatory Visit (HOSPITAL_COMMUNITY): Payer: Self-pay

## 2023-02-01 ENCOUNTER — Other Ambulatory Visit (HOSPITAL_COMMUNITY): Payer: Self-pay

## 2023-02-07 ENCOUNTER — Ambulatory Visit (HOSPITAL_BASED_OUTPATIENT_CLINIC_OR_DEPARTMENT_OTHER): Payer: PPO | Admitting: Advanced Practice Midwife

## 2023-02-07 ENCOUNTER — Other Ambulatory Visit (HOSPITAL_COMMUNITY)
Admission: RE | Admit: 2023-02-07 | Discharge: 2023-02-07 | Disposition: A | Discharge status: Home / Self Care | Payer: PPO | Source: Ambulatory Visit | Attending: Advanced Practice Midwife | Admitting: Advanced Practice Midwife

## 2023-02-07 ENCOUNTER — Encounter (HOSPITAL_BASED_OUTPATIENT_CLINIC_OR_DEPARTMENT_OTHER): Payer: Self-pay | Admitting: Advanced Practice Midwife

## 2023-02-07 VITALS — BP 121/63 | HR 79 | Ht 61.75 in | Wt 124.2 lb

## 2023-02-07 DIAGNOSIS — N6323 Unspecified lump in the left breast, lower outer quadrant: Secondary | ICD-10-CM | POA: Diagnosis not present

## 2023-02-07 DIAGNOSIS — N6313 Unspecified lump in the right breast, lower outer quadrant: Secondary | ICD-10-CM | POA: Diagnosis not present

## 2023-02-07 DIAGNOSIS — Z01419 Encounter for gynecological examination (general) (routine) without abnormal findings: Secondary | ICD-10-CM

## 2023-02-07 DIAGNOSIS — Z1151 Encounter for screening for human papillomavirus (HPV): Secondary | ICD-10-CM | POA: Insufficient documentation

## 2023-02-07 NOTE — Progress Notes (Signed)
Subjective:     Cheryl Chan is a 67 y.o. female here at [facility] for a routine exam. Hx significant for endometrial cancer with total hysterectomy in 2013.  Last Pap 2020 and wnl.  Current complaints: none..  Personal health history reviewed: yes.  Do you have a primary care provider? yes Do you feel safe at home? yes  Flowsheet Row Office Visit from 02/07/2023 in [facility] for [facility] at [facility] Total Score 0       Health Maintenance Due  Topic Date Due   Medicare Annual Wellness (AWV)  Never done   Pneumonia Vaccine 56+ Years old (2 of 2 - PCV) 05/19/2021   DTaP/Tdap/Td (3 - Td or Tdap) 08/04/2022   INFLUENZA VACCINE  12/20/2022   COVID-19 Vaccine (7 - 2023-24 season) 01/20/2023     Risk factors for chronic health problems: Smoking: never Alchohol/how much: None Pt BMI: Body mass index is 22.9 kg/m.   Gynecologic History Patient's last menstrual period was 05/22/2003. Contraception: status post hysterectomy Last Pap: 2020. Results were: normal Last mammogram: 10/12/21. Results were: normal  Obstetric History OB History  Gravida Para Term Preterm AB Living  5 4     1 4   SAB IAB Ectopic Multiple Live Births  1            # Outcome Date GA Lbr Len/2nd Weight Sex Type Anes PTL Lv  5 Para 1991    F      4 Para 1988    M      3 Para 1986    M      2 Para 1984    M      1 SAB              The following portions of the patient's history were reviewed and updated as appropriate: allergies, current medications, past family history, past medical history, past social history, past surgical history, and problem list.  Review of Systems Pertinent items noted in HPI and remainder of comprehensive ROS otherwise negative.    Objective:  BP 121/63 (BP Location: Right Arm, Patient Position: Sitting, Cuff Size: Normal)   Pulse 79   Ht 5' 1.75" (1.568 m)   Wt 124 lb 3.2 oz (56.3 kg)   LMP 05/22/2003   BMI 22.90 kg/m   VS  reviewed, nursing note reviewed,  Constitutional: well developed, well nourished, no distress HEENT: normocephalic, thyroid without enlargement or mass HEART: RRR, no murmurs rubs/gallops RESP: clear and equal to auscultation bilaterally in all lobes  Breast Exam:   exam performed: right breast with 2-3 small (0.25 cm or less) smooth round masses in lower outer quadrant at margin of pt rib, no skin or nipple changes or axillary nodes, left breast with similar small masses in lower outer quadrant, no new skin or nipple changes or axillary nodes. Left nipple is inverted, which pt reports occurred in 2021 but was evaluated with diagnostic mammogram and MRI.  Abdomen: soft Neuro: alert and oriented x 3 Skin: warm, dry Psych: affect normal Pelvic exam:  Performed: surgically absent cervix and uterus, vaginal cuff wnl, vaginal walls and external labia wnl       Assessment/Plan:   1. Encounter for annual routine gynecological examination --Doing well, no gyn concerns or problems --Pt with normal Paps following hysterectomy for endometrial cancer.  Pt unsure of need for further Pap testing but desires Pap today if there is any evidence it is  needed..  Pap collected today. Dr Berton Lan consulted and appropriate to send Pap today, will discuss with pt annually and review need for continued Pap.  - Cytology - PAP  2. Mass of lower outer quadrant of left breast --Bilateral, symmetrical tiny masses that are smooth, mobile, round.  Inverted nipple on left breast since 2021 with full evaluation at that time.   - MM Digital Diagnostic Bilat; Future - [place] LIMITED ULTRASOUND INCLUDING AXILLA LEFT BREAST ; Future  3. Mass of lower outer quadrant of right breast  - MM Digital Diagnostic Bilat; Future - [place] LIMITED ULTRASOUND INCLUDING AXILLA RIGHT BREAST; Future     Return in about 1 year (around 02/07/2024) for annual exam.   Sharen Counter, CNM 7:13 PM

## 2023-02-08 ENCOUNTER — Ambulatory Visit (HOSPITAL_BASED_OUTPATIENT_CLINIC_OR_DEPARTMENT_OTHER): Payer: Self-pay | Admitting: Obstetrics & Gynecology

## 2023-02-12 LAB — CYTOLOGY - PAP
Comment: NEGATIVE
Diagnosis: NEGATIVE
High risk HPV: NEGATIVE

## 2023-02-19 ENCOUNTER — Encounter (HOSPITAL_BASED_OUTPATIENT_CLINIC_OR_DEPARTMENT_OTHER): Payer: PPO | Admitting: Internal Medicine

## 2023-03-01 ENCOUNTER — Ambulatory Visit
Admission: RE | Admit: 2023-03-01 | Discharge: 2023-03-01 | Disposition: A | Discharge status: Home / Self Care | Payer: PPO | Source: Ambulatory Visit | Attending: Advanced Practice Midwife | Admitting: Advanced Practice Midwife

## 2023-03-01 ENCOUNTER — Other Ambulatory Visit (HOSPITAL_COMMUNITY): Payer: Self-pay

## 2023-03-01 DIAGNOSIS — N6323 Unspecified lump in the left breast, lower outer quadrant: Secondary | ICD-10-CM

## 2023-03-01 DIAGNOSIS — N6320 Unspecified lump in the left breast, unspecified quadrant: Secondary | ICD-10-CM | POA: Diagnosis not present

## 2023-03-01 DIAGNOSIS — N6313 Unspecified lump in the right breast, lower outer quadrant: Secondary | ICD-10-CM

## 2023-03-18 DIAGNOSIS — E7801 Familial hypercholesterolemia: Secondary | ICD-10-CM | POA: Diagnosis not present

## 2023-03-19 ENCOUNTER — Other Ambulatory Visit (HOSPITAL_COMMUNITY): Payer: Self-pay

## 2023-03-19 LAB — LIPID PANEL
Chol/HDL Ratio: 2.7 {ratio} (ref 0.0–4.4)
Cholesterol, Total: 196 mg/dL (ref 100–199)
HDL: 73 mg/dL (ref >39)
LDL Chol Calc (NIH): 107 mg/dL — ABNORMAL HIGH (ref 0–99)
Triglycerides: 92 mg/dL (ref 0–149)
VLDL Cholesterol Cal: 16 mg/dL (ref 5–40)

## 2023-03-21 ENCOUNTER — Other Ambulatory Visit: Payer: PPO

## 2023-03-25 ENCOUNTER — Other Ambulatory Visit (HOSPITAL_COMMUNITY): Payer: Self-pay

## 2023-03-26 ENCOUNTER — Ambulatory Visit (HOSPITAL_BASED_OUTPATIENT_CLINIC_OR_DEPARTMENT_OTHER): Payer: PPO | Admitting: Internal Medicine

## 2023-03-26 ENCOUNTER — Encounter (HOSPITAL_BASED_OUTPATIENT_CLINIC_OR_DEPARTMENT_OTHER): Payer: Self-pay | Admitting: Internal Medicine

## 2023-03-26 VITALS — BP 90/50 | HR 86 | Ht 61.0 in | Wt 126.0 lb

## 2023-03-26 DIAGNOSIS — E7801 Familial hypercholesterolemia: Secondary | ICD-10-CM

## 2023-03-26 DIAGNOSIS — T466X5D Adverse effect of antihyperlipidemic and antiarteriosclerotic drugs, subsequent encounter: Secondary | ICD-10-CM

## 2023-03-26 DIAGNOSIS — I2510 Atherosclerotic heart disease of native coronary artery without angina pectoris: Secondary | ICD-10-CM | POA: Diagnosis not present

## 2023-03-26 DIAGNOSIS — M791 Myalgia, unspecified site: Secondary | ICD-10-CM

## 2023-03-26 MED ORDER — NEXLETOL 180 MG PO TABS: 1.0000 | ORAL_TABLET | Freq: Every day | ORAL | Status: DC

## 2023-03-26 NOTE — Patient Instructions (Signed)
Medication Instructions:  Try Nexletol samples Please contact the office with our you tolerate this medication.  If well tolerated, we will try to combine Zetia + Nexletol = Nexlizet 180/10mg  once daily  *If you need a refill on your cardiac medications before your next appointment, please call your pharmacy*   Lab Work: FASTING lab work to check cholesterol in about 4 months  If you have labs (blood work) drawn today and your tests are completely normal, you will receive your results only by: MyChart Message (if you have MyChart) OR A paper copy in the mail If you have any lab test that is abnormal or we need to change your treatment, we will call you to review the results.   Follow-Up: At [facility], you and your health needs are our priority.  As part of our continuing mission to provide you with exceptional heart care, we have created designated Provider Care Teams.  These Care Teams include your primary Cardiologist (physician) and Advanced Practice Providers (APPs -  Physician Assistants and Nurse Practitioners) who all work together to provide you with the care you need, when you need it.  We recommend signing up for the patient portal called "MyChart".  Sign up information is provided on this After Visit Summary.  MyChart is used to connect with patients for Virtual Visits (Telemedicine).  Patients are able to view lab/test results, encounter notes, upcoming appointments, etc.  Non-urgent messages can be sent to your provider as well.   To learn more about what you can do with MyChart, go to [email or web address].    Your next appointment:   4 months with Dr. Rennis Golden or Eligha Bridegroom NP

## 2023-03-26 NOTE — Progress Notes (Signed)
LIPID CLINIC CONSULT NOTE  Chief Complaint:  Manage dyslipidemia  Primary Care Physician: Pincus Sanes, MD  Primary Cardiologist:  None  HPI:  Cheryl Chan is a 67 y.o. female who is being seen today for the evaluation of dyslipidemia at the request of Burns, Bobette Mo, MD.  This is a pleasant 67 year old female who is referred for evaluation management of dyslipidemia.  She reports a longstanding history of high cholesterol and has been statin intolerant for a number of years.  Despite significant exercise and dietary changes in the past she is not been able to get adequate control of her cholesterol.  She also reports that her mother had high cholesterol and that there is heart disease in her family.  Her brother was diagnosed in 2006 with a nonischemic cardiomyopathy ultimately died.  She underwent familial genetic testing with Dr. Jomarie Longs and was referred to Dr. Sampson Goon in our heart failure clinic to see if she possibly could be at risk for ARVC.  Ultimately she had a cardiac MRI in February 2020 which showed normal biventricular function.  He had a stress echo in 2011 at the direction of Dr. Delfin Edis which was negative for ischemia.  Her husband is a Teacher, early years/pre at the [employer].  She had several questions regarding her elevated cholesterol.  Incidentally her most recent lipid showed total cholesterol 285, triglycerides 220, HDL 55 and direct LDL 195.  Her LDL has been well over 200 in the past, suggestive of a familial hyperlipidemia.  Surprisingly, she has not had prior cardiovascular events that were aware of.  She currently denies any coronary symptoms.  02/19/2020  This is for is seen today in follow-up.  Overall she is doing well with Repatha.  She denies any significant side effects with that.  She has had an excellent response to Repatha with marked improvement in her cholesterol.  Total cholesterol is now 170, HDL 61, LDL 87 and triglycerides 126, improved  from a direct LDL of 195.  06/27/2021  Cheryl Chan returns today for routine follow-up.  Overall she seems to be doing well.  She is tolerating Repatha.  Her cholesterol has come down significantly although I would argue remains above target is a diabetic with some ASCVD (imaging evidence of atherosclerotic calcification of the aorta).  Her most recent lipid profile showed total cholesterol of 196, HDL 66, triglycerides 104 and LDL 109.  We discussed the possibility of adding ezetimibe however she felt that she was not necessarily 100% compliant with the medication and or diet but is improving and has lost 10 pounds recently.  11/06/2022  Cheryl Chan is seen today for follow-up with her husband who recently retired from [employer].  She has had an interval increase in her LDL cholesterol.  Recent labs showed total 222, triglycerides 106, HDL 67 and LDL 133, up from 107.  She continues on Repatha.  We have previously talked about possibility of additive therapy or ezetimibe however she wanted to work on medication compliance and continued weight loss.  Despite the fact that she has succeeded in additional weight loss with the addition of Mounjaro to her diet, her LDL is higher.  03/26/2023  Cheryl Chan is seen today in follow-up.  Of note her cholesterol has further improved on ezetimibe.  Total now 196, triglycerides 92, HDL 73 and LDL 107.  She had previously been in the low 100s.  She remains on Repatha.  Her target LDL however is lower  less than 70.  We discussed about ways we may be able to achieve this.  She has been on the [facility] but her weight is appropriate at this time.  Her blood pressure was quite low today at 90/50.  It is unclear why this is as she is on no blood pressure lowering medications.  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"   Diet-controlled type 2 diabetes  mellitus (HCC)    Endometrial cancer (HCC) 10/2011 dx   s/p vag hyst 11/2011   History of chicken pox    Hyperlipidemia    Hypothyroidism    Positive TB test    CXRs ok   Rotator cuff injury    Shingles     Past Surgical History:  Procedure Laterality Date   ABDOMINAL HYSTERECTOMY     CESAREAN SECTION     x's 4   DILATION AND CURETTAGE OF UTERUS     hysteroscopy   FRACTURE SURGERY     nose as a child   OPEN REDUCTION INTERNAL FIXATION (ORIF) DISTAL PHALANX Left 08/05/2012   Procedure: OPEN REDUCTION INTERNAL FIXATION (ORIF) DISTAL PHALANX VERSUS EXTERNAL FIXATION LEFT RING FINGER;  Surgeon: Tami Ribas, MD;  Location: [facility];  Service: Orthopedics;  Laterality: Left;   ROBOTIC ASSISTED LAP VAGINAL HYSTERECTOMY  12/04/2011   Procedure: ROBOTIC ASSISTED LAPAROSCOPIC VAGINAL HYSTERECTOMY;  Surgeon: Rejeana Brock A. Duard Brady, MD;  Location: [facility];  Service: Gynecology;  Laterality: N/A;   SHOULDER ARTHROSCOPY WITH ROTATOR CUFF REPAIR AND SUBACROMIAL DECOMPRESSION Left 11/27/2013   Procedure: LEFT SHOULDER ARTHROSCOPY, SUBACROMIAL DECOMPRESSION, PARTIAL ACROMIOPLASTY WITH  CORACOACROMIAL RELEASE, DISTAL CLAVICULECTOMY WITH ROTATOR CUFF REPAIR AND EXTENSIVE DEBRIDEMENT;  Surgeon: Nilda Simmer, MD;  Location: [facility];  Service: Orthopedics;  Laterality: Left;    FAMHx:  Family History  Problem Relation Age of Onset   Arthritis Mother    Cancer Father 26       prostrate   Hypertension Father    Stroke Father        in setting of sepsis f/ RMSF   Heart disease Brother 68       cardiomyopathy s/p ICD, on transplant list   Heart failure Brother    Heart failure Other     SOCHx:   reports that she has never smoked. She has never used smokeless tobacco. She reports that she does not currently use alcohol. She reports that she does not use drugs.  ALLERGIES:  Allergies  Allergen Reactions   Crestor [Rosuvastatin Calcium]     Muscle ache   Demerol  [Meperidine]    Statins    Trulicity [Dulaglutide]     Nausea, diarrhea   Victoza [Liraglutide]     Nausea, diarrhea   Metformin And Related     diarrhea    ROS: Pertinent items noted in HPI and remainder of comprehensive ROS otherwise negative.  HOME MEDS: Current Outpatient Medications on File Prior to Visit  Medication Sig Dispense Refill   celecoxib (CELEBREX) 200 MG capsule Take 1 capsule (200 mg total) by mouth daily. 90 capsule 1   Continuous Blood Gluc Receiver (DEXCOM G7 RECEIVER) DEVI Use as directed to check sugars. 1 each 0   dapagliflozin propanediol (FARXIGA) 10 MG TABS tablet Take 1 tablet (10 mg total) by mouth daily before breakfast. 90 tablet 1   Evolocumab (  REPATHA SURECLICK) 140 MG/ML SOAJ Inject 1 Dose into the skin every 14 (fourteen) days. 6 mL 3   ezetimibe (ZETIA) 10 MG tablet Take 1 tablet (10 mg total) by mouth daily. 90 tablet 3   fluticasone (FLONASE) 50 MCG/ACT nasal spray Place 2 sprays into both nostrils daily. 48 g 0   glucose blood (FREESTYLE LITE) test strip Test 3 times daily as needed 100 strip 12   Lancets (FREESTYLE) lancets Use as instructed 100 each 12   levothyroxine (SYNTHROID) 88 MCG tablet Take 1 tablet (88 mcg total) by mouth daily before breakfast. 90 tablet 3   sertraline (ZOLOFT) 100 MG tablet Take 1 tablet (100 mg total) by mouth daily. 90 tablet 1   tirzepatide (MOUNJARO) 7.5 MG/0.5ML Pen Inject 7.5 mg into the skin once a week. 6 mL 0   No current facility-administered medications on file prior to visit.    LABS/IMAGING: No results found for this or any previous visit (from the past 48 hour(s)). No results found.  LIPID PANEL:    Component Value Date/Time   CHOL 196 03/18/2023 0845   TRIG 92 03/18/2023 0845   HDL 73 03/18/2023 0845   CHOLHDL 2.7 03/18/2023 0845   CHOLHDL 3 12/18/2022 0856   VLDL 18.6 12/18/2022 0856   LDLCALC 107 (H) 03/18/2023 0845   LDLDIRECT 195.0 06/12/2019 0849    WEIGHTS: Wt Readings from Last  3 Encounters:  03/26/23 126 lb (57.2 kg)  02/07/23 124 lb 3.2 oz (56.3 kg)  12/18/22 123 lb 12.8 oz (56.2 kg)    VITALS: BP (!) 90/50 (BP Location: Left Arm, Patient Position: Sitting, Cuff Size: Normal)   Pulse 86   Ht 5\' 1"  (1.549 m)   Wt 126 lb (57.2 kg)   LMP 05/22/2003   SpO2 97%   BMI 23.81 kg/m   EXAM: Deferred  EKG: Deferred  ASSESSMENT: Possible familial hyperlipidemia- Tedra Coupe Criteria Statin intolerance - myalgias, goal LDL less than 70 Type 2 diabetes. Atherosclerotic aortic calcification  PLAN: 1.   Mrs. Abbasi has had further improvement in her cholesterol however LDL remains above target.  We could consider adding Nexletol to her regimen and then combining the Nexletol with Zetia as Nexlizet if this is well-tolerated.  I provided samples of the Nexletol today.  If this is tolerated then we will go ahead and prescribe Nexlizet 180/10 mg daily.  Plan then repeat lipids in about 3 to 4 months and follow-up afterwards.  Chrystie Nose, MD, [facility], FACP  [place]  [facility]  Medical Director of the Advanced Lipid Disorders &  Cardiovascular Risk Reduction Clinic Diplomate of the American Board of Clinical Lipidology Attending Cardiologist  Direct Dial: [phone]  Fax: [phone]  Website:  [email or web address] Nicely Laporshia Hogen 03/26/2023, 10:38 AM

## 2023-03-28 ENCOUNTER — Other Ambulatory Visit (HOSPITAL_COMMUNITY): Payer: Self-pay

## 2023-03-28 DIAGNOSIS — L821 Other seborrheic keratosis: Secondary | ICD-10-CM | POA: Diagnosis not present

## 2023-03-28 DIAGNOSIS — D2262 Melanocytic nevi of left upper limb, including shoulder: Secondary | ICD-10-CM | POA: Diagnosis not present

## 2023-03-28 DIAGNOSIS — L738 Other specified follicular disorders: Secondary | ICD-10-CM | POA: Diagnosis not present

## 2023-03-28 DIAGNOSIS — D1801 Hemangioma of skin and subcutaneous tissue: Secondary | ICD-10-CM | POA: Diagnosis not present

## 2023-03-28 DIAGNOSIS — L814 Other melanin hyperpigmentation: Secondary | ICD-10-CM | POA: Diagnosis not present

## 2023-03-28 DIAGNOSIS — D225 Melanocytic nevi of trunk: Secondary | ICD-10-CM | POA: Diagnosis not present

## 2023-03-28 DIAGNOSIS — D2261 Melanocytic nevi of right upper limb, including shoulder: Secondary | ICD-10-CM | POA: Diagnosis not present

## 2023-03-28 DIAGNOSIS — D2371 Other benign neoplasm of skin of right lower limb, including hip: Secondary | ICD-10-CM | POA: Diagnosis not present

## 2023-03-28 MED ORDER — CLINDAMYCIN PHOSPHATE 1 % EX SOLN
1.0000 | Freq: Two times a day (BID) | CUTANEOUS | 11 refills | Status: DC
  Filled 2023-03-28: qty 60, 30d supply, fill #0
  Filled 2023-05-23: qty 60, 30d supply, fill #1

## 2023-04-01 ENCOUNTER — Other Ambulatory Visit: Payer: Self-pay | Admitting: Internal Medicine

## 2023-04-01 ENCOUNTER — Other Ambulatory Visit (HOSPITAL_COMMUNITY): Payer: Self-pay

## 2023-04-01 MED ORDER — MOUNJARO 7.5 MG/0.5ML ~~LOC~~ SOAJ
7.5000 mg | SUBCUTANEOUS | 0 refills | Status: DC
  Filled 2023-04-01: qty 2, 28d supply, fill #0
  Filled 2023-04-23: qty 2, 28d supply, fill #1
  Filled 2023-06-12: qty 2, 28d supply, fill #2

## 2023-04-02 ENCOUNTER — Other Ambulatory Visit (HOSPITAL_COMMUNITY): Payer: Self-pay

## 2023-04-07 ENCOUNTER — Other Ambulatory Visit (HOSPITAL_BASED_OUTPATIENT_CLINIC_OR_DEPARTMENT_OTHER): Payer: Self-pay | Admitting: Internal Medicine

## 2023-04-08 ENCOUNTER — Encounter (HOSPITAL_COMMUNITY): Payer: Self-pay

## 2023-04-08 ENCOUNTER — Other Ambulatory Visit (HOSPITAL_COMMUNITY): Payer: Self-pay

## 2023-04-10 ENCOUNTER — Other Ambulatory Visit (HOSPITAL_COMMUNITY): Payer: Self-pay

## 2023-04-12 ENCOUNTER — Other Ambulatory Visit (HOSPITAL_COMMUNITY): Payer: Self-pay

## 2023-04-15 ENCOUNTER — Other Ambulatory Visit (HOSPITAL_COMMUNITY): Payer: Self-pay

## 2023-04-15 ENCOUNTER — Encounter (HOSPITAL_COMMUNITY): Payer: Self-pay

## 2023-04-16 ENCOUNTER — Telehealth: Payer: Self-pay | Admitting: Pharmacy Technician

## 2023-04-16 ENCOUNTER — Other Ambulatory Visit (HOSPITAL_COMMUNITY): Payer: Self-pay

## 2023-04-16 NOTE — Telephone Encounter (Signed)
-----   Message from Nurse Eileen Stanford E sent at 04/16/2023 11:29 AM EST ----- Regarding: PA for repatha needed This patient needs a PA for Repatha. Looks like it has been pending and she has been messaging thru MyChart with the [facility] outpatient pharmacists.

## 2023-04-16 NOTE — Telephone Encounter (Signed)
Pharmacy Patient Advocate Encounter   Received notification from Pt Calls Messages that prior authorization for repatha is required/requested.   Insurance verification completed.   The patient is insured through [facility] [employer]/RX [employer]VANCE .   Per test claim: PA required; PA submitted to above mentioned insurance via Phone Key/confirmation #/EOC by phone Status is pending

## 2023-04-17 ENCOUNTER — Other Ambulatory Visit (HOSPITAL_COMMUNITY): Payer: Self-pay

## 2023-04-17 NOTE — Telephone Encounter (Signed)
Pharmacy Patient Advocate Encounter  Received notification from [facility] [employer] that Prior Authorization for repatha has been APPROVED from 04/16/23 to 10/14/23. Ran test claim, Copay is $134.65- one month. This test claim was processed through [facility]- copay amounts may vary at other pharmacies due to pharmacy/plan contracts, or as the patient moves through the different stages of their insurance plan.   PA #/Case ID/Reference #: [id number]

## 2023-04-22 ENCOUNTER — Other Ambulatory Visit (HOSPITAL_COMMUNITY): Payer: Self-pay

## 2023-04-23 ENCOUNTER — Other Ambulatory Visit (HOSPITAL_COMMUNITY): Payer: Self-pay

## 2023-04-23 ENCOUNTER — Other Ambulatory Visit: Payer: Self-pay

## 2023-05-23 ENCOUNTER — Other Ambulatory Visit (HOSPITAL_COMMUNITY): Payer: Self-pay

## 2023-05-23 ENCOUNTER — Other Ambulatory Visit (HOSPITAL_BASED_OUTPATIENT_CLINIC_OR_DEPARTMENT_OTHER): Payer: Self-pay | Admitting: Internal Medicine

## 2023-05-23 MED ORDER — NEXLETOL 180 MG PO TABS
1.0000 | ORAL_TABLET | Freq: Every day | ORAL | 1 refills | Status: DC
  Filled 2023-05-23 – 2023-06-13 (×2): qty 90, 90d supply, fill #0
  Filled 2023-06-14: qty 30, 30d supply, fill #0
  Filled 2023-07-07: qty 90, 90d supply, fill #1
  Filled 2023-10-16: qty 60, 60d supply, fill #2

## 2023-05-24 ENCOUNTER — Other Ambulatory Visit (HOSPITAL_COMMUNITY): Payer: Self-pay

## 2023-05-27 ENCOUNTER — Telehealth: Payer: Self-pay

## 2023-05-27 ENCOUNTER — Other Ambulatory Visit (HOSPITAL_COMMUNITY): Payer: Self-pay

## 2023-05-27 NOTE — Telephone Encounter (Signed)
 Pharmacy Patient Advocate Encounter  Received notification from [employer] that Prior Authorization for NEXLETOL  has been APPROVED from 05/27/23 to 11/23/23. Ran test claim, Copay is $100. This test claim was processed through [facility]- copay amounts may vary at other pharmacies due to pharmacy/plan contracts, or as the patient moves through the different stages of their insurance plan.

## 2023-05-27 NOTE — Telephone Encounter (Signed)
 Pharmacy Patient Advocate Encounter   Received notification from CoverMyMeds that prior authorization for NEXLETOL  is required/requested.   Insurance verification completed.   The patient is insured through [employer]/RX ADVANCE .   Per test claim: PA required; PA submitted to above mentioned insurance via CoverMyMeds Key/confirmation #/EOC [facility] Status is pending

## 2023-05-28 ENCOUNTER — Other Ambulatory Visit (HOSPITAL_COMMUNITY): Payer: Self-pay

## 2023-05-28 ENCOUNTER — Encounter (HOSPITAL_COMMUNITY): Payer: Self-pay

## 2023-05-28 ENCOUNTER — Telehealth: Payer: Self-pay | Admitting: Pharmacy Technician

## 2023-05-28 ENCOUNTER — Other Ambulatory Visit: Payer: Self-pay | Admitting: Internal Medicine

## 2023-05-28 MED ORDER — DEXCOM G7 RECEIVER DEVI
0 refills | Status: DC
Start: 2023-05-28 — End: 2023-06-20
  Filled 2023-05-28: qty 1, 90d supply, fill #0

## 2023-05-28 NOTE — Telephone Encounter (Signed)
 Pharmacy Patient Advocate Encounter   Received notification from CoverMy[employer]eds that prior authorization for Dexcom G7 Receiver device is required/requested.   Insurance verification completed.   The patient is insured through [facility] ADVANTAGE/RX ADVANCE .   Per test claim: PA required; PA submitted to above mentioned insurance via CoverMyMeds Key/confirmation #/EOC Key: [id number] Status is pending

## 2023-05-29 NOTE — Telephone Encounter (Signed)
 Pharmacy Patient Advocate Encounter  Received notification from [employer] that Prior Authorization for [facility] G7 Receiver device has been DENIED.  Full denial letter will be uploaded to the media tab. See denial reason below.   PA #/Case ID/Reference #: [id number]  *Insurance denied due to not having tried and failed a preferred alternative. The insurance preferred alternative is Freestyle Libre CGM.

## 2023-05-30 ENCOUNTER — Other Ambulatory Visit: Payer: Self-pay

## 2023-05-30 ENCOUNTER — Other Ambulatory Visit (HOSPITAL_COMMUNITY): Payer: Self-pay

## 2023-05-30 DIAGNOSIS — E119 Type 2 diabetes mellitus without complications: Secondary | ICD-10-CM

## 2023-05-30 MED ORDER — FREESTYLE LIBRE 3 READER DEVI
1.0000 | 0 refills | Status: DC
  Filled 2023-05-30: qty 1, 90d supply, fill #0

## 2023-05-30 MED ORDER — FREESTYLE LIBRE 2 READER DEVI
1.0000 | 0 refills | Status: DC
Start: 2023-05-30 — End: 2023-05-30
  Filled 2023-05-30: qty 1, 90d supply, fill #0

## 2023-05-30 MED ORDER — FREESTYLE LIBRE 3 SENSOR MISC
1.0000 | 5 refills | Status: AC
Start: 2023-05-30 — End: ?
  Filled 2023-05-30: qty 2, 28d supply, fill #0
  Filled 2023-07-07: qty 2, 28d supply, fill #1
  Filled 2023-10-18: qty 2, 28d supply, fill #2
  Filled 2023-12-06: qty 2, 28d supply, fill #3
  Filled 2024-02-11: qty 2, 28d supply, fill #4
  Filled 2024-05-19: qty 2, 28d supply, fill #5

## 2023-05-30 MED ORDER — FREESTYLE LIBRE 3 SENSOR MISC
1.0000 | 11 refills | Status: DC
Start: 2023-05-30 — End: 2023-05-30
  Filled 2023-05-30: qty 2, 28d supply, fill #0

## 2023-05-30 NOTE — Telephone Encounter (Signed)
 Freestyle Libre and sensors sent today.

## 2023-05-31 ENCOUNTER — Other Ambulatory Visit (HOSPITAL_COMMUNITY): Payer: Self-pay

## 2023-06-12 ENCOUNTER — Other Ambulatory Visit: Payer: Self-pay

## 2023-06-12 ENCOUNTER — Other Ambulatory Visit: Payer: Self-pay | Admitting: Internal Medicine

## 2023-06-13 ENCOUNTER — Other Ambulatory Visit (HOSPITAL_COMMUNITY): Payer: Self-pay

## 2023-06-13 MED ORDER — SERTRALINE HCL 100 MG PO TABS
100.0000 mg | ORAL_TABLET | Freq: Every day | ORAL | 1 refills | Status: DC
  Filled 2023-06-13 – 2023-07-31 (×2): qty 90, 90d supply, fill #0
  Filled 2023-10-23: qty 90, 90d supply, fill #1

## 2023-06-14 ENCOUNTER — Other Ambulatory Visit (HOSPITAL_COMMUNITY): Payer: Self-pay

## 2023-06-19 ENCOUNTER — Encounter: Payer: Self-pay | Admitting: Internal Medicine

## 2023-06-19 NOTE — Patient Instructions (Addendum)
Blood work was ordered.       Medications changes include :   None    A referral was ordered and someone will call you to schedule an appointment.     Return in about 6 months (around 12/18/2023) for follow up.   Health Maintenance, Female Adopting a healthy lifestyle and getting preventive care are important in promoting health and wellness. Ask your health care provider about: The right schedule for you to have regular tests and exams. Things you can do on your own to prevent diseases and keep yourself healthy. What should I know about diet, weight, and exercise? Eat a healthy diet  Eat a diet that includes plenty of vegetables, fruits, low-fat dairy products, and lean protein. Do not eat a lot of foods that are high in solid fats, added sugars, or sodium. Maintain a healthy weight Body mass index (BMI) is used to identify weight problems. It estimates body fat based on height and weight. Your health care provider can help determine your BMI and help you achieve or maintain a healthy weight. Get regular exercise Get regular exercise. This is one of the most important things you can do for your health. Most adults should: Exercise for at least 150 minutes each week. The exercise should increase your heart rate and make you sweat (moderate-intensity exercise). Do strengthening exercises at least twice a week. This is in addition to the moderate-intensity exercise. Spend less time sitting. Even light physical activity can be beneficial. Watch cholesterol and blood lipids Have your blood tested for lipids and cholesterol at 68 years of age, then have this test every 5 years. Have your cholesterol levels checked more often if: Your lipid or cholesterol levels are high. You are older than 68 years of age. You are at high risk for heart disease. What should I know about cancer screening? Depending on your health history and family history, you may need to have cancer  screening at various ages. This may include screening for: Breast cancer. Cervical cancer. Colorectal cancer. Skin cancer. Lung cancer. What should I know about heart disease, diabetes, and high blood pressure? Blood pressure and heart disease High blood pressure causes heart disease and increases the risk of stroke. This is more likely to develop in people who have high blood pressure readings or are overweight. Have your blood pressure checked: Every 3-5 years if you are 65-42 years of age. Every year if you are 53 years old or older. Diabetes Have regular diabetes screenings. This checks your fasting blood sugar level. Have the screening done: Once every three years after age 106 if you are at a normal weight and have a low risk for diabetes. More often and at a younger age if you are overweight or have a high risk for diabetes. What should I know about preventing infection? Hepatitis B If you have a higher risk for hepatitis B, you should be screened for this virus. Talk with your health care provider to find out if you are at risk for hepatitis B infection. Hepatitis C Testing is recommended for: Everyone born from [birth year] through 1965. Anyone with known risk factors for hepatitis C. Sexually transmitted infections (STIs) Get screened for STIs, including gonorrhea and chlamydia, if: You are sexually active and are younger than 68 years of age. You are older than 69 years of age and your health care provider tells you that you are at risk for this type of infection. Your sexual activity has  changed since you were last screened, and you are at increased risk for chlamydia or gonorrhea. Ask your health care provider if you are at risk. Ask your health care provider about whether you are at high risk for HIV. Your health care provider may recommend a prescription medicine to help prevent HIV infection. If you choose to take medicine to prevent HIV, you should first get tested for HIV. You  should then be tested every 3 months for as long as you are taking the medicine. Pregnancy If you are about to stop having your period (premenopausal) and you may become pregnant, seek counseling before you get pregnant. Take 400 to 800 micrograms (mcg) of folic acid every day if you become pregnant. Ask for birth control (contraception) if you want to prevent pregnancy. Osteoporosis and menopause Osteoporosis is a disease in which the bones lose minerals and strength with aging. This can result in bone fractures. If you are 25 years old or older, or if you are at risk for osteoporosis and fractures, ask your health care provider if you should: Be screened for bone loss. Take a calcium or vitamin D supplement to lower your risk of fractures. Be given hormone replacement therapy (HRT) to treat symptoms of menopause. Follow these instructions at home: Alcohol use Do not drink alcohol if: Your health care provider tells you not to drink. You are pregnant, may be pregnant, or are planning to become pregnant. If you drink alcohol: Limit how much you have to: 0-1 drink a day. Know how much alcohol is in your drink. In the [place] one drink equals one 12 oz bottle of beer (355 mL), one 5 oz glass of wine (148 mL), or one 1 oz glass of hard liquor (44 mL). Lifestyle Do not use any products that contain nicotine or tobacco. These products include cigarettes, chewing tobacco, and vaping devices, such as e-cigarettes. If you need help quitting, ask your health care provider. Do not use street drugs. Do not share needles. Ask your health care provider for help if you need support or information about quitting drugs. General instructions Schedule regular health, dental, and eye exams. Stay current with your vaccines. Tell your health care provider if: You often feel depressed. You have ever been abused or do not feel safe at home. Summary Adopting a healthy lifestyle and getting preventive care are  important in promoting health and wellness. Follow your health care provider's instructions about healthy diet, exercising, and getting tested or screened for diseases. Follow your health care provider's instructions on monitoring your cholesterol and blood pressure. This information is not intended to replace advice given to you by your health care provider. Make sure you discuss any questions you have with your health care provider. Document Revised: 09/26/2020 Document Reviewed: 09/26/2020 Elsevier Patient Education  2024 [employer].

## 2023-06-19 NOTE — Progress Notes (Unsigned)
Up   Subjective:    Patient ID: Cheryl Chan, female    DOB: October 26, 1955, 68 y.o.   MRN: [id number]      HPI Cheryl Chan is here for a Physical exam and her chronic medical problems.   ? Sinus infection - headaches, nasal congestion, sinus pressure, pnd - persistent for more than 1 week.  No cough, wheeze, sob, fever or sore throat.     Medications and allergies reviewed with patient and updated if appropriate.  Current Outpatient Medications on File Prior to Visit  Medication Sig Dispense Refill   Bempedoic Acid (NEXLETOL) 180 MG TABS Take 1 tablet (180 mg total) by mouth daily. 90 tablet 1   celecoxib (CELEBREX) 200 MG capsule Take 1 capsule (200 mg total) by mouth daily. 90 capsule 1   clindamycin (CLEOCIN T) 1 % external solution Apply a small amount topically twice a day 60 mL 11   Continuous Glucose Receiver (FREESTYLE LIBRE 3 READER) DEVI Use as directed 1 each 0   Continuous Glucose Sensor (FREESTYLE LIBRE 3 SENSOR) MISC Place 1 sensor on the skin every 14 days. Use to check glucose continuously 2 each 5   dapagliflozin propanediol (FARXIGA) 10 MG TABS tablet Take 1 tablet (10 mg total) by mouth daily before breakfast. 90 tablet 1   Evolocumab (REPATHA SURECLICK) 140 MG/ML SOAJ Inject 1 Dose into the skin every 14 (fourteen) days. 6 mL 3   ezetimibe (ZETIA) 10 MG tablet Take 1 tablet (10 mg total) by mouth daily. 90 tablet 3   fluticasone (FLONASE) 50 MCG/ACT nasal spray Place 2 sprays into both nostrils daily. 48 g 0   glucose blood (FREESTYLE LITE) test strip Test 3 times daily as needed 100 strip 12   Lancets (FREESTYLE) lancets Use as instructed 100 each 12   levothyroxine (SYNTHROID) 88 MCG tablet Take 1 tablet (88 mcg total) by mouth daily before breakfast. 90 tablet 3   sertraline (ZOLOFT) 100 MG tablet Take 1 tablet (100 mg total) by mouth daily. 90 tablet 1   tirzepatide (MOUNJARO) 7.5 MG/0.5ML Pen Inject 7.5 mg into the skin once a week. 6 mL 0   No current  facility-administered medications on file prior to visit.    Review of Systems  Constitutional:  Positive for diaphoresis (some night sweats). Negative for fever.  HENT:  Positive for congestion (mild), postnasal drip and sinus pain. Negative for ear pain and sore throat.   Eyes:  Negative for visual disturbance (? cataracts or age changes).  Respiratory:  Negative for cough, shortness of breath and wheezing.   Cardiovascular:  Negative for chest pain, palpitations and leg swelling.  Gastrointestinal:  Negative for abdominal pain, blood in stool, constipation and diarrhea.       Occ gerd  Genitourinary:  Negative for dysuria.  Musculoskeletal:  Positive for arthralgias (hands - celebrex helps). Negative for back pain.  Skin:  Negative for rash.  Neurological:  Positive for headaches. Negative for dizziness and light-headedness.  Psychiatric/Behavioral:  Negative for dysphoric mood. The patient is not nervous/anxious.        Objective:   Vitals:   06/20/23 0804  BP: 108/70  Pulse: 68  Temp: 98.1 F (36.7 C)  SpO2: 96%   Filed Weights   06/20/23 0804  Weight: 125 lb (56.7 kg)   Body mass index is 23.62 kg/m.  BP Readings from Last 3 Encounters:  06/20/23 108/70  03/26/23 (!) 90/50  02/07/23 121/63    Wt Readings from Last 3  Encounters:  06/20/23 125 lb (56.7 kg)  03/26/23 126 lb (57.2 kg)  02/07/23 124 lb 3.2 oz (56.3 kg)       Physical Exam Constitutional: She appears well-developed and well-nourished. No distress.  HENT:  Head: Normocephalic and atraumatic.  Right Ear: External ear normal. Ear canal impacted with cerumen Left Ear: External ear normal.  Normal ear canal 50% wax, partially visualized TM is normal Mouth/Throat: Oropharynx is clear and moist.  Eyes: Conjunctivae normal.  Neck: Neck supple. No tracheal deviation present. No thyromegaly present.  No carotid bruit  Cardiovascular: Normal rate, regular rhythm and normal heart sounds.   No murmur  heard.  No edema. Pulmonary/Chest: Effort normal and breath sounds normal. No respiratory distress. She has no wheezes. She has no rales.  Breast: deferred   Abdominal: Soft. She exhibits no distension. There is no tenderness.  Lymphadenopathy: She has no cervical adenopathy.  Skin: Skin is warm and dry. She is not diaphoretic.  Psychiatric: She has a normal mood and affect. Her behavior is normal.     Lab Results  Component Value Date   WBC 5.0 12/18/2022   HGB 13.9 12/18/2022   HCT 43.0 12/18/2022   PLT 269.0 12/18/2022   GLUCOSE 115 (H) 12/18/2022   CHOL 196 03/18/2023   TRIG 92 03/18/2023   HDL 73 03/18/2023   LDLDIRECT 195.0 06/12/2019   LDLCALC 107 (H) 03/18/2023   ALT 11 12/18/2022   AST 14 12/18/2022   NA 139 12/18/2022   K 3.6 12/18/2022   CL 103 12/18/2022   CREATININE 0.62 12/18/2022   BUN 25 (H) 12/18/2022   CO2 28 12/18/2022   TSH 1.84 12/18/2022   HGBA1C 6.0 12/18/2022   MICROALBUR 0.9 06/08/2022         Assessment & Plan:   Physical exam: Screening blood work  ordered Exercise  walks, silver sneakers Weight  is normal Substance abuse  none   Reviewed recommended immunizations.   Health Maintenance  Topic Date Due   Medicare Annual Wellness (AWV)  Never done   Pneumonia Vaccine 82+ Years old (2 of 2 - PCV) 03/30/2014   DTaP/Tdap/Td (3 - Td or Tdap) 08/04/2022   COVID-19 Vaccine (7 - 2024-25 season) 01/20/2023   Diabetic kidney evaluation - Urine ACR  06/09/2023   OPHTHALMOLOGY EXAM  06/05/2023   HEMOGLOBIN A1C  06/20/2023   Diabetic kidney evaluation - eGFR measurement  12/18/2023   FOOT EXAM  12/18/2023   DEXA SCAN  10/12/2024   MAMMOGRAM  02/28/2025   Colonoscopy  06/28/2030   INFLUENZA VACCINE  Completed   Hepatitis C Screening  Completed   Zoster Vaccines- Shingrix  Completed   HPV VACCINES  Aged Out          See Problem List for Assessment and Plan of chronic medical problems.

## 2023-06-20 ENCOUNTER — Other Ambulatory Visit: Payer: Self-pay

## 2023-06-20 ENCOUNTER — Encounter: Payer: Self-pay | Admitting: Internal Medicine

## 2023-06-20 ENCOUNTER — Ambulatory Visit: Payer: PPO | Admitting: Internal Medicine

## 2023-06-20 ENCOUNTER — Other Ambulatory Visit (HOSPITAL_COMMUNITY): Payer: Self-pay

## 2023-06-20 VITALS — BP 108/70 | HR 68 | Temp 98.1°F | Ht 61.0 in | Wt 125.0 lb

## 2023-06-20 DIAGNOSIS — E559 Vitamin D deficiency, unspecified: Secondary | ICD-10-CM

## 2023-06-20 DIAGNOSIS — E782 Mixed hyperlipidemia: Secondary | ICD-10-CM | POA: Diagnosis not present

## 2023-06-20 DIAGNOSIS — F3289 Other specified depressive episodes: Secondary | ICD-10-CM

## 2023-06-20 DIAGNOSIS — E119 Type 2 diabetes mellitus without complications: Secondary | ICD-10-CM

## 2023-06-20 DIAGNOSIS — M85852 Other specified disorders of bone density and structure, left thigh: Secondary | ICD-10-CM | POA: Diagnosis not present

## 2023-06-20 DIAGNOSIS — Z0000 Encounter for general adult medical examination without abnormal findings: Secondary | ICD-10-CM

## 2023-06-20 DIAGNOSIS — J019 Acute sinusitis, unspecified: Secondary | ICD-10-CM | POA: Diagnosis not present

## 2023-06-20 DIAGNOSIS — H6121 Impacted cerumen, right ear: Secondary | ICD-10-CM | POA: Diagnosis not present

## 2023-06-20 DIAGNOSIS — E039 Hypothyroidism, unspecified: Secondary | ICD-10-CM | POA: Diagnosis not present

## 2023-06-20 LAB — CBC WITH DIFFERENTIAL/PLATELET
Basophils Absolute: 0 10*3/uL (ref 0.0–0.1)
Basophils Relative: 0.8 % (ref 0.0–3.0)
Eosinophils Absolute: 0.1 10*3/uL (ref 0.0–0.7)
Eosinophils Relative: 2.3 % (ref 0.0–5.0)
HCT: 43.3 % (ref 36.0–46.0)
Hemoglobin: 14.3 g/dL (ref 12.0–15.0)
Lymphocytes Relative: 32.4 % (ref 12.0–46.0)
Lymphs Abs: 1.4 10*3/uL (ref 0.7–4.0)
MCHC: 33 g/dL (ref 30.0–36.0)
MCV: 90.6 fL (ref 78.0–100.0)
Monocytes Absolute: 0.5 10*3/uL (ref 0.1–1.0)
Monocytes Relative: 10.7 % (ref 3.0–12.0)
Neutro Abs: 2.3 10*3/uL (ref 1.4–7.7)
Neutrophils Relative %: 53.8 % (ref 43.0–77.0)
Platelets: 275 10*3/uL (ref 150.0–400.0)
RBC: 4.78 Mil/uL (ref 3.87–5.11)
RDW: 14.2 % (ref 11.5–15.5)
WBC: 4.3 10*3/uL (ref 4.0–10.5)

## 2023-06-20 LAB — COMPREHENSIVE METABOLIC PANEL
ALT: 15 U/L (ref 0–35)
AST: 20 U/L (ref 0–37)
Albumin: 4.5 g/dL (ref 3.5–5.2)
Alkaline Phosphatase: 44 U/L (ref 39–117)
BUN: 24 mg/dL — ABNORMAL HIGH (ref 6–23)
CO2: 30 meq/L (ref 19–32)
Calcium: 9.3 mg/dL (ref 8.4–10.5)
Chloride: 101 meq/L (ref 96–112)
Creatinine, Ser: 0.65 mg/dL (ref 0.40–1.20)
GFR: 91.32 mL/min (ref >60.00)
Glucose, Bld: 102 mg/dL — ABNORMAL HIGH (ref 70–99)
Potassium: 4.6 meq/L (ref 3.5–5.1)
Sodium: 139 meq/L (ref 135–145)
Total Bilirubin: 0.7 mg/dL (ref 0.2–1.2)
Total Protein: 7.5 g/dL (ref 6.0–8.3)

## 2023-06-20 LAB — MICROALBUMIN / CREATININE URINE RATIO
Creatinine,U: 79 mg/dL
Microalb Creat Ratio: 0.9 mg/g (ref 0.0–30.0)
Microalb, Ur: <0.7 mg/dL (ref 0.0–1.9)

## 2023-06-20 LAB — LIPID PANEL
Cholesterol: 187 mg/dL (ref 0–200)
HDL: 75.6 mg/dL (ref >39.00)
LDL Cholesterol: 98 mg/dL (ref 0–99)
NonHDL: 111.75
Total CHOL/HDL Ratio: 2
Triglycerides: 70 mg/dL (ref 0.0–149.0)
VLDL: 14 mg/dL (ref 0.0–40.0)

## 2023-06-20 LAB — TSH: TSH: 2.72 u[IU]/mL (ref 0.35–5.50)

## 2023-06-20 LAB — VITAMIN D 25 HYDROXY (VIT D DEFICIENCY, FRACTURES): VITD: 31.97 ng/mL (ref 30.00–100.00)

## 2023-06-20 LAB — HEMOGLOBIN A1C: Hgb A1c MFr Bld: 6.3 % (ref 4.6–6.5)

## 2023-06-20 MED ORDER — AMOXICILLIN-POT CLAVULANATE 875-125 MG PO TABS
1.0000 | ORAL_TABLET | Freq: Two times a day (BID) | ORAL | 0 refills | Status: AC
Start: 2023-06-20 — End: 2023-06-27
  Filled 2023-06-20: qty 14, 7d supply, fill #0

## 2023-06-20 MED ORDER — MOUNJARO 7.5 MG/0.5ML ~~LOC~~ SOAJ
7.5000 mg | SUBCUTANEOUS | 1 refills | Status: DC
  Filled 2023-06-20 – 2023-07-16 (×2): qty 6, 84d supply, fill #0
  Filled 2023-10-03: qty 6, 84d supply, fill #1

## 2023-06-20 MED ORDER — DAPAGLIFLOZIN PROPANEDIOL 10 MG PO TABS
10.0000 mg | ORAL_TABLET | Freq: Every day | ORAL | 1 refills | Status: DC
  Filled 2023-06-20 – 2023-07-31 (×2): qty 90, 90d supply, fill #0
  Filled 2023-10-23: qty 90, 90d supply, fill #1

## 2023-06-20 MED ORDER — CELECOXIB 200 MG PO CAPS
200.0000 mg | ORAL_CAPSULE | Freq: Every day | ORAL | 1 refills | Status: DC
  Filled 2023-06-20: qty 90, 90d supply, fill #0
  Filled 2023-09-02: qty 90, 90d supply, fill #1

## 2023-06-20 NOTE — Assessment & Plan Note (Signed)
Acute Likely bacterial  Start Augmentin 875-125 mg BID x 7 day otc cold medications Rest, fluid Call if no improvement

## 2023-06-20 NOTE — Assessment & Plan Note (Signed)
Chronic  Clinically euthyroid Check tsh and will titrate med dose if needed Currently taking levothyroxine 88 mcg daily

## 2023-06-20 NOTE — Progress Notes (Signed)
PRE-PROCEDURE EXAM: Right TM cannot be visualized due to total occlusion/impaction of the ear canal. PROCEDURE INDICATION: remove wax to visualize ear drum & relieve discomfort CONSENT:  Verbal   PROCEDURE NOTE:  RIGHT EAR:  The CMA irrigated the ear canal with warm water to remove the wax.  POST- PROCEDURE EXAM: Right TM successfully visualized and found to have no erythema.  Right ear canal with scant remaining cerumen.  Left ear canal with no remaining cerumen.  Patient tolerated the procedure well and had relief of their symptoms after the procedure.

## 2023-06-20 NOTE — Assessment & Plan Note (Signed)
Chronic Taking vitamin D daily Check vitamin D level

## 2023-06-20 NOTE — Assessment & Plan Note (Signed)
Chronic Controlled, Stable Continue sertraline 100 mg daily

## 2023-06-20 NOTE — Assessment & Plan Note (Signed)
Chronic Regular exercise and healthy diet encouraged Check lipid panel  Continue Repatha 140 mg q. 14 days, Zetia 10 mg daily

## 2023-06-20 NOTE — Assessment & Plan Note (Signed)
Chronic DEXA up-to-date Encouraged regular exercise Taking calcium and vitamin D-gets some of her calcium from the multivitamin and the rest from food

## 2023-06-20 NOTE — Assessment & Plan Note (Signed)
Acute Right ear canal impacted with cerumen Intermittent affecting hearing Ear lavage today - see cma note

## 2023-06-20 NOTE — Assessment & Plan Note (Signed)
Chronic  Lab Results  Component Value Date   HGBA1C 6.0 12/18/2022   Sugars well controlled Check A1c, urine microalbumin Continue Mounjaro 7.5 mg weekly, Farxiga 10 mg daily Doing well with Mounjaro-weight is stable.   Regular exercise advised Stressed diabetic diet

## 2023-06-29 ENCOUNTER — Other Ambulatory Visit: Payer: Self-pay | Admitting: Internal Medicine

## 2023-06-30 ENCOUNTER — Other Ambulatory Visit: Payer: Self-pay

## 2023-07-01 ENCOUNTER — Other Ambulatory Visit (HOSPITAL_COMMUNITY): Payer: Self-pay

## 2023-07-01 MED ORDER — LEVOTHYROXINE SODIUM 88 MCG PO TABS
88.0000 ug | ORAL_TABLET | Freq: Every day | ORAL | 3 refills | Status: AC
  Filled 2023-07-01: qty 90, 90d supply, fill #0
  Filled 2023-10-03: qty 90, 90d supply, fill #1
  Filled 2023-12-19: qty 90, 90d supply, fill #2
  Filled 2024-03-31: qty 90, 90d supply, fill #3

## 2023-07-07 ENCOUNTER — Other Ambulatory Visit: Payer: Self-pay | Admitting: Internal Medicine

## 2023-07-07 DIAGNOSIS — E119 Type 2 diabetes mellitus without complications: Secondary | ICD-10-CM

## 2023-07-08 ENCOUNTER — Other Ambulatory Visit: Payer: Self-pay

## 2023-07-08 ENCOUNTER — Other Ambulatory Visit (HOSPITAL_COMMUNITY): Payer: Self-pay

## 2023-07-08 MED ORDER — FREESTYLE LIBRE 3 READER DEVI
1.0000 | 0 refills | Status: AC
  Filled 2023-07-08 – 2023-10-18 (×2): qty 1, 90d supply, fill #0

## 2023-07-09 ENCOUNTER — Other Ambulatory Visit: Payer: Self-pay

## 2023-07-16 ENCOUNTER — Other Ambulatory Visit: Payer: Self-pay

## 2023-07-16 ENCOUNTER — Other Ambulatory Visit (HOSPITAL_COMMUNITY): Payer: Self-pay

## 2023-07-18 DIAGNOSIS — E7801 Familial hypercholesterolemia: Secondary | ICD-10-CM | POA: Diagnosis not present

## 2023-07-19 LAB — LIPID PANEL
Chol/HDL Ratio: 2 {ratio} (ref 0.0–4.4)
Cholesterol, Total: 141 mg/dL (ref 100–199)
HDL: 72 mg/dL (ref >39)
LDL Chol Calc (NIH): 53 mg/dL (ref 0–99)
Triglycerides: 84 mg/dL (ref 0–149)
VLDL Cholesterol Cal: 16 mg/dL (ref 5–40)

## 2023-07-22 NOTE — Progress Notes (Unsigned)
 Cardiology Office Note:  .   Date:  07/24/2023  ID:  Cheryl Chan, DOB 06-02-1955, MRN [id number] PCP: Pincus Sanes, MD  [facility] Providers Cardiologist:  None    Patient Profile: .      PMH Familial hypercholesterolemia Family history of heart disease Father - aortic stenosis Brother's son passed away in his 31s from ARVC Statin intolerance Type 2 diabetes mellitus Aortic atherosclerosis  Referred to Advanced Lipid Disorder clinic and seen by Dr. Rennis Golden 07/31/2019. She had previously been by Dr. Gala Romney for familial NICM due to risk for ARVC and ultimately underwent cardiac MRI 06/2018 which showed normal biventricular function and stress echo 2011 which was negative for ischemia. Her husband is a Teacher, early years/pre at [employer] outpatient pharmacy. Longstanding history of high cholesterol and intolerance of statin for a number of years. Despite significant exercise and dietary changes, she has not been able to get adequate control of cholesterol. Her mother has history of high cholesterol and heart disease. Her lipid profile at time of referral revealed total cholesterol 285, triglycerides 220, HDL 55 and direct LDL 195. LDL has been well over 200 in the past, suggestive of familial hyperlipidemia by Tedra Coupe criteria. Previous intolerance of rosuvastatin, atorvastatin and other moderate and high intensity statins. She was started on Repatha with improvement in total cholesterol to 170,  LDL to 87, triglycerides 126, HDL 61, improved from direct LDL of 195.   At office visit 11/06/2022 with Dr. Rennis Golden, she had an interval increase in her LDL to 133.  She had achieved weight loss with the addition of Mounjaro, but unfortunately LDL increased despite compliance with Repatha.  Ezetimibe 10 mg daily was added.  Last lipid clinic visit was 03/26/2023 with Dr. Rennis Golden.  She had further improvement in cholesterol on ezetimibe with total cholesterol 196, triglycerides 92, HDL 73, and LDL 107.  Target LDL  is less than 70.  BP was quite low 90/50 despite no antihypertensive medications.  Her weight was now at an appropriate number for her height.  Samples of Nexletol were given with plan to change to Nexlizet 180/10 mg daily if this is well-tolerated.       History of Present Illness: .   Cheryl Chan is a very pleasant 68 y.o. female who is here today for follow-up of dyslipidemia. Lipid panel completed 07/18/23 revealed total cholesterol 141, triglycerides 84, HDL 72, and LDL 53.  This is a significant improvement with the addition of Nexletol. She remains of ezetimibe and Repatha. She has not experienced any adverse effects from these medications and is very pleased with the results.  Her husband, who is a retired Teacher, early years/pre, went back through all prior lipid studies and this is best lipid profile she's had. We discussed her concern about her brother's history of heart disease, documented as ARVC. Her nephew died in his 19s unfortunately, prior to receiving a heart transplant. Her father has aortic stenosis and she has been taking him to appointments with cardiology and hospice as hospice care has been considered. We reviewed her prior cardiac tests including CT calcium score test which showed minimal calcification and CMR that showed aortic valve sclerosis without evidence of stenosis. She expresses a desire to prevent any potential heart issues and is proactive in managing her health. 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  She denies chest pain, shortness of breath, orthopnea, PND, edema, palpitations, presyncope, or syncope.  Discussed the use of AI scribe software for clinical note transcription  with the patient, who gave verbal consent to proceed.   ROS: See HPI       Studies Reviewed: Marland Kitchen   EKG Interpretation Date/Time:  Wednesday July 24 2023 09:06:42 EST Ventricular Rate:  84 PR Interval:  164 QRS Duration:  86 QT Interval:  416 QTC Calculation: 491 R Axis:   31  Text  Interpretation: Normal sinus rhythm Possible Left atrial enlargement Prolonged QT Using Bazette formula for correct QT, QT is 379 msec Confirmed by Eligha Bridegroom [id number]) on 07/24/2023 9:21:26 AM     Risk Assessment/Calculations:             Physical Exam:   VS:  BP (!) 106/56 (BP Location: Right Arm, Patient Position: Sitting, Cuff Size: Normal)   Pulse 84   Ht 5\' 1"  (1.549 m)   Wt 128 lb (58.1 kg)   LMP 05/22/2003   SpO2 94%   BMI 24.19 kg/m    Wt Readings from Last 3 Encounters:  07/24/23 128 lb (58.1 kg)  06/20/23 125 lb (56.7 kg)  03/26/23 126 lb (57.2 kg)    GEN: Well nourished, well developed in no acute distress NECK: No JVD; No carotid bruits CARDIAC: RRR, no murmurs, rubs, gallops RESPIRATORY:  Clear to auscultation without rales, wheezing or rhonchi  ABDOMEN: Soft, non-tender, non-distended EXTREMITIES:  No edema; No deformity     ASSESSMENT AND PLAN: .    Familial hypercholesterolemia/Dyslipidemia LDL goal < 70: Lipid panel completed 07/18/2023 with total cholesterol 141, triglycerides 84, HDL 72, and LDL-C 53. She is tolerating medications without any concerning side effects.  Advised her to notify us if she would like to combine Nexletol and ezetimibe at time of next refill.  We will continue Repatha 140 mg every 2 weeks, ezetimibe 10 mg daily, and Nexletol 180 mg daily. Plan for repeat lipid test in 1 year if not completed by PCP prior to next clinic visit.   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  CMR 06/2018 with mildly thickened aortic valve with no significant stenosis or regurgitation, normal LV function.  We will get echocardiogram for surveillance of structural heart disease.  Coronary artery calcification: CT calcium score 27.7 (66 percentile) 09/2022. She denies chest pain, dyspnea, or other symptoms concerning for angina.  No indication for further ischemic evaluation at this time. As noted below, secondary prevention emphasized.   CV  risk counseling: We discussed family history and concerns about future ASCVD.  Her ASCVD risk score is low now that she has good control of cholesterol.  She does not have history of diabetes, smoking, or hypertension. We discussed EKG from today which initially reported prolonged QT but with further analysis, QT is normal.  She remains active and is not having any concerning cardiac symptoms.  Focus on secondary prevention including heart healthy mostly plant based diet avoiding saturated fat, processed foods, simple carbohydrates, and sugar along with aiming for at least 150 minutes of moderate intensity exercise each week.         Disposition:1 year with Dr. Rennis Golden or me  Signed, Eligha Bridegroom, NP-C

## 2023-07-24 ENCOUNTER — Ambulatory Visit (HOSPITAL_BASED_OUTPATIENT_CLINIC_OR_DEPARTMENT_OTHER): Payer: PPO | Admitting: Nurse Practitioner

## 2023-07-24 ENCOUNTER — Encounter (HOSPITAL_BASED_OUTPATIENT_CLINIC_OR_DEPARTMENT_OTHER): Payer: Self-pay | Admitting: Nurse Practitioner

## 2023-07-24 VITALS — BP 106/56 | HR 84 | Ht 61.0 in | Wt 128.0 lb

## 2023-07-24 DIAGNOSIS — I2510 Atherosclerotic heart disease of native coronary artery without angina pectoris: Secondary | ICD-10-CM | POA: Diagnosis not present

## 2023-07-24 DIAGNOSIS — E7801 Familial hypercholesterolemia: Secondary | ICD-10-CM | POA: Diagnosis not present

## 2023-07-24 DIAGNOSIS — I359 Nonrheumatic aortic valve disorder, unspecified: Secondary | ICD-10-CM | POA: Diagnosis not present

## 2023-07-24 DIAGNOSIS — Z7189 Other specified counseling: Secondary | ICD-10-CM | POA: Diagnosis not present

## 2023-07-24 NOTE — Patient Instructions (Signed)
 Medication Instructions:   Your physician recommends that you continue on your current medications as directed. Please refer to the Current Medication list given to you today.   *If you need a refill on your cardiac medications before your next appointment, please call your pharmacy*   Lab Work:  None ordered.  If you have labs (blood work) drawn today and your tests are completely normal, you will receive your results only by: MyChart Message (if you have MyChart) OR A paper copy in the mail If you have any lab test that is abnormal or we need to change your treatment, we will call you to review the results.   Testing/Procedures:  Your physician has requested that you have an echocardiogram. Echocardiography is a painless test that uses sound waves to create images of your heart. It provides your doctor with information about the size and shape of your heart and how well your heart's chambers and valves are working. This procedure takes approximately one hour. There are no restrictions for this procedure. Please do NOT wear cologne, perfume or lotions (deodorant is allowed). Please arrive 15 minutes prior to your appointment time.   Follow-Up: At [facility], you and your health needs are our priority.  As part of our continuing mission to provide you with exceptional heart care, we have created designated Provider Care Teams.  These Care Teams include your primary Cardiologist (physician) and Advanced Practice Providers (APPs -  Physician Assistants and Nurse Practitioners) who all work together to provide you with the care you need, when you need it.  We recommend signing up for the patient portal called "MyChart".  Sign up information is provided on this After Visit Summary.  MyChart is used to connect with patients for Virtual Visits (Telemedicine).  Patients are able to view lab/test results, encounter notes, upcoming appointments, etc.  Non-urgent messages can be sent to  your provider as well.   To learn more about what you can do with MyChart, go to [email or web address].    Your next appointment:   1 year(s)  Provider:   K. Italy Hilty, MD or Eligha Bridegroom, NP    Other Instructions  Your physician wants you to follow-up in: 1 year. You will receive a reminder letter in the mail two months in advance. If you don't receive a letter, please call our office to schedule the follow-up appointment.

## 2023-07-31 ENCOUNTER — Other Ambulatory Visit: Payer: Self-pay

## 2023-07-31 ENCOUNTER — Other Ambulatory Visit (HOSPITAL_COMMUNITY): Payer: Self-pay

## 2023-08-07 DIAGNOSIS — H2513 Age-related nuclear cataract, bilateral: Secondary | ICD-10-CM | POA: Diagnosis not present

## 2023-08-07 DIAGNOSIS — H524 Presbyopia: Secondary | ICD-10-CM | POA: Diagnosis not present

## 2023-08-07 DIAGNOSIS — H5051 Esophoria: Secondary | ICD-10-CM | POA: Diagnosis not present

## 2023-08-07 DIAGNOSIS — H53143 Visual discomfort, bilateral: Secondary | ICD-10-CM | POA: Diagnosis not present

## 2023-08-12 DIAGNOSIS — H53143 Visual discomfort, bilateral: Secondary | ICD-10-CM | POA: Diagnosis not present

## 2023-08-12 DIAGNOSIS — E119 Type 2 diabetes mellitus without complications: Secondary | ICD-10-CM | POA: Diagnosis not present

## 2023-08-12 DIAGNOSIS — H43393 Other vitreous opacities, bilateral: Secondary | ICD-10-CM | POA: Diagnosis not present

## 2023-08-12 LAB — HM DIABETES EYE EXAM

## 2023-08-14 ENCOUNTER — Encounter: Payer: Self-pay | Admitting: Internal Medicine

## 2023-08-15 ENCOUNTER — Encounter (HOSPITAL_BASED_OUTPATIENT_CLINIC_OR_DEPARTMENT_OTHER): Payer: Self-pay | Admitting: Internal Medicine

## 2023-08-15 ENCOUNTER — Encounter (HOSPITAL_BASED_OUTPATIENT_CLINIC_OR_DEPARTMENT_OTHER): Payer: Self-pay

## 2023-08-15 ENCOUNTER — Ambulatory Visit (INDEPENDENT_AMBULATORY_CARE_PROVIDER_SITE_OTHER)

## 2023-08-15 DIAGNOSIS — E7801 Familial hypercholesterolemia: Secondary | ICD-10-CM | POA: Diagnosis not present

## 2023-08-15 DIAGNOSIS — I359 Nonrheumatic aortic valve disorder, unspecified: Secondary | ICD-10-CM

## 2023-08-15 LAB — ECHOCARDIOGRAM COMPLETE
Area-P 1/2: 4.39 cm2
S' Lateral: 2.13 cm

## 2023-08-27 ENCOUNTER — Other Ambulatory Visit: Payer: Self-pay

## 2023-10-03 ENCOUNTER — Other Ambulatory Visit (HOSPITAL_COMMUNITY): Payer: Self-pay

## 2023-10-03 ENCOUNTER — Other Ambulatory Visit: Payer: Self-pay

## 2023-10-03 ENCOUNTER — Ambulatory Visit (HOSPITAL_BASED_OUTPATIENT_CLINIC_OR_DEPARTMENT_OTHER): Admitting: Obstetrics & Gynecology

## 2023-10-03 ENCOUNTER — Encounter (HOSPITAL_BASED_OUTPATIENT_CLINIC_OR_DEPARTMENT_OTHER): Payer: Self-pay | Admitting: Obstetrics & Gynecology

## 2023-10-03 VITALS — BP 120/60 | HR 76 | Ht 61.0 in | Wt 129.2 lb

## 2023-10-03 DIAGNOSIS — N952 Postmenopausal atrophic vaginitis: Secondary | ICD-10-CM

## 2023-10-03 DIAGNOSIS — F5101 Primary insomnia: Secondary | ICD-10-CM

## 2023-10-03 DIAGNOSIS — Z780 Asymptomatic menopausal state: Secondary | ICD-10-CM

## 2023-10-03 DIAGNOSIS — R6882 Decreased libido: Secondary | ICD-10-CM

## 2023-10-03 MED ORDER — ESTRADIOL 0.1 MG/GM VA CREA
1.0000 g | TOPICAL_CREAM | VAGINAL | 1 refills | Status: DC
  Filled 2023-10-03: qty 42.5, 90d supply, fill #0
  Filled 2023-12-19: qty 42.5, 90d supply, fill #1

## 2023-10-03 MED ORDER — PROGESTERONE MICRONIZED 100 MG PO CAPS
100.0000 mg | ORAL_CAPSULE | Freq: Every day | ORAL | 3 refills | Status: DC
  Filled 2023-10-03: qty 90, 90d supply, fill #0
  Filled 2023-12-27: qty 90, 90d supply, fill #1
  Filled 2024-03-31: qty 90, 90d supply, fill #2

## 2023-10-03 NOTE — Progress Notes (Signed)
 GYNECOLOGY  VISIT  CC:   discuss HRT  HPI: 68 y.o. [id number] Married White or Caucasian female here for discussion of possible hormone replacement therapy use.  Having issues with vaginal dryness, skin dryness, lethargy, decreased sex drive, and brain fog.  Husband is a Teacher, early years/pre and he wanted her to discuss this.  Her husband is a Teacher, early years/pre and really thinks she should consider it.  She wanted to discuss with me.  Different type of hormone use including compounded/bio-identical vs herbal vs commercially available dosing discussed.  Risks including breast cancer, DVT/PE/CVA as well as cardiovascular disease discussed.  Coronary CT was done 09/19/2022 and showed calcium  deposits with increased cardiovascular risks  with testing in the 66th percentile.  Discussed my concerns for CVD, MI, stoke.  I do not, currently, feel comfortable with typical HRT dosing but I do think she could consider vaginal estrogen sue.  Some smaller risks still pres however, I would feel comfortable starting vaginal estrogen cream.  Dosing, side effects,s risks reviewed.  Pt comfortable with this plan for now.      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 90 tablet 1   celecoxib  (CELEBREX ) 200 MG capsule Take 1 capsule (200 mg total) by mouth daily. 90 capsule 1   Continuous Glucose Receiver (FREESTYLE LIBRE 3 READER) DEVI Use as directed 1 each 0   Continuous Glucose Sensor (FREESTYLE  LIBRE 3 SENSOR) MISC Place 1 sensor on the skin every 14 days. Use to check glucose continuously 2 each 5   dapagliflozin  propanediol (FARXIGA ) 10 MG TABS tablet Take 1 tablet (10 mg total) by mouth daily before breakfast. 90 tablet 1   Evolocumab  (REPATHA  SURECLICK) 140 MG/ML SOAJ Inject 1 Dose into the skin every 14 (fourteen) days. 6 mL 3   ezetimibe  (ZETIA ) 10 MG tablet Take 1 tablet (10 mg total) by mouth daily. 90 tablet 3   fluticasone  (FLONASE ) 50 MCG/ACT nasal spray Place 2 sprays into both nostrils daily. 48 g 0   glucose blood (FREESTYLE LITE) test strip Test 3 times daily as needed 100 strip 12   Lancets (FREESTYLE) lancets Use as instructed 100 each 12   levothyroxine  (SYNTHROID ) 88 MCG tablet Take 1 tablet (88 mcg total) by mouth daily before breakfast. 90 tablet 3   sertraline  (ZOLOFT ) 100 MG tablet Take 1 tablet (100 mg total) by mouth daily. 90 tablet 1   tirzepatide  (MOUNJARO ) 7.5 MG/0.5ML Pen Inject 7.5 mg into the skin once a week. 6 mL 1   No current facility-administered medications on file prior to visit.    ALLERGIES: Crestor [rosuvastatin calcium ], Demerol [meperidine], Statins, Trulicity  [dulaglutide ], Victoza  [liraglutide ], and Metformin  and related  SH:  married, non smoker  Review of Systems  Constitutional: Negative.   Genitourinary:        Vaginal dryness  PHYSICAL EXAMINATION:    BP 120/60 (BP Location: Right Arm, Patient Position: Sitting, Cuff Size: Normal)   Pulse 76   Ht 5\' 1"  (1.549 m)   Wt 129 lb 3.2 oz (58.6 kg)   LMP 05/22/2003   BMI 24.41 kg/m     Physical Exam Constitutional:      Appearance: Normal appearance.  Neurological:     General: No focal deficit present.     Mental Status: She is alert.  Psychiatric:        Mood and Affect: Mood normal.    Assessment/Plan: 1. Postmenopausal (Primary) - will not start typical oral or transdermal HRT  2. Vaginal atrophy - will start vaginal estrogen cream - estradiol  (ESTRACE   VAGINAL) 0.1 MG/GM vaginal cream; Place 1 gram vaginally 2 (two) times a week.  Dispense: 42.5 g; Refill: 1 - recheck September with breast/pelvic exam  3. Primary insomnia - progesterone  (PROMETRIUM ) 100 MG capsule; Take 1 capsule (100 mg total) by mouth at bedtime.  Dispense: 90 capsule; Refill: 3  4. Decreased libido

## 2023-10-16 ENCOUNTER — Other Ambulatory Visit (HOSPITAL_COMMUNITY): Payer: Self-pay

## 2023-10-16 ENCOUNTER — Other Ambulatory Visit: Payer: Self-pay

## 2023-10-16 ENCOUNTER — Other Ambulatory Visit: Payer: Self-pay | Admitting: Internal Medicine

## 2023-10-16 ENCOUNTER — Telehealth: Payer: Self-pay | Admitting: Pharmacy Technician

## 2023-10-16 DIAGNOSIS — I2510 Atherosclerotic heart disease of native coronary artery without angina pectoris: Secondary | ICD-10-CM

## 2023-10-16 DIAGNOSIS — I359 Nonrheumatic aortic valve disorder, unspecified: Secondary | ICD-10-CM

## 2023-10-16 DIAGNOSIS — E7801 Familial hypercholesterolemia: Secondary | ICD-10-CM

## 2023-10-16 MED ORDER — REPATHA SURECLICK 140 MG/ML ~~LOC~~ SOAJ
140.0000 mg | SUBCUTANEOUS | 3 refills | Status: AC
  Filled 2023-10-16: qty 2, 28d supply, fill #0
  Filled 2023-10-24: qty 6, 84d supply, fill #0
  Filled 2024-01-13: qty 6, 84d supply, fill #1
  Filled 2024-02-29 – 2024-04-07 (×2): qty 6, 84d supply, fill #2

## 2023-10-16 NOTE — Telephone Encounter (Signed)
 Pharmacy Patient Advocate Encounter   Received notification from CoverMyMeds that prior authorization for repatha  is required/requested.   Insurance verification completed.   The patient is insured through [facility] ADVANTAGE/RX ADVANCE .   Per test claim: PA required; PA submitted to above mentioned insurance via CoverMyMeds Key/confirmation #/EOC [id number] Status is pending

## 2023-10-17 ENCOUNTER — Other Ambulatory Visit (HOSPITAL_COMMUNITY): Payer: Self-pay

## 2023-10-17 ENCOUNTER — Other Ambulatory Visit: Payer: Self-pay

## 2023-10-17 ENCOUNTER — Encounter: Payer: Self-pay | Admitting: Internal Medicine

## 2023-10-17 NOTE — Telephone Encounter (Signed)
 Pharmacy Patient Advocate Encounter  Received notification from [facility] ADVANTAGE/RX ADVANCE that Prior Authorization for Repatha  has been APPROVED from 10/16/23 to 10/15/24. Ran test claim, Copay is $0.00- one month. This test claim was processed through [facility]- copay amounts may vary at other pharmacies due to pharmacy/plan contracts, or as the patient moves through the different stages of their insurance plan.   PA #/Case ID/Reference #: [id number]

## 2023-10-18 ENCOUNTER — Other Ambulatory Visit (HOSPITAL_COMMUNITY): Payer: Self-pay

## 2023-10-18 ENCOUNTER — Other Ambulatory Visit: Payer: Self-pay

## 2023-10-18 MED ORDER — AZITHROMYCIN 500 MG PO TABS
500.0000 mg | ORAL_TABLET | Freq: Every day | ORAL | 0 refills | Status: AC
  Filled 2023-10-18: qty 3, 3d supply, fill #0

## 2023-10-18 NOTE — Addendum Note (Signed)
 Addended by: Colene Dauphin on: 10/18/2023 08:52 AM   Modules accepted: Orders

## 2023-10-24 ENCOUNTER — Other Ambulatory Visit (HOSPITAL_COMMUNITY): Payer: Self-pay

## 2023-10-25 ENCOUNTER — Other Ambulatory Visit (HOSPITAL_COMMUNITY): Payer: Self-pay

## 2023-11-15 ENCOUNTER — Ambulatory Visit (INDEPENDENT_AMBULATORY_CARE_PROVIDER_SITE_OTHER)

## 2023-11-15 VITALS — Ht 61.0 in | Wt 129.0 lb

## 2023-11-15 DIAGNOSIS — Z0000 Encounter for general adult medical examination without abnormal findings: Secondary | ICD-10-CM | POA: Diagnosis not present

## 2023-11-15 NOTE — Progress Notes (Signed)
 Subjective:   Cheryl Chan is a 68 y.o. who presents for a Medicare Wellness preventive visit.  As a reminder, Annual Wellness Visits don't include a physical exam, and some assessments may be limited, especially if this visit is performed virtually. We may recommend an in-person follow-up visit with your provider if needed.  Visit Complete: Virtual I connected with  Cheryl Chan on 11/15/23 by a audio enabled telemedicine application and verified that I am speaking with the correct person using two identifiers.  Patient Location: Home  Provider Location: Office/Clinic  I discussed the limitations of evaluation and management by telemedicine. The patient expressed understanding and agreed to proceed.  Vital Signs: Because this visit was a virtual/telehealth visit, some criteria may be missing or patient reported. Any vitals not documented were not able to be obtained and vitals that have been documented are patient reported.  VideoDeclined- This patient declined Librarian, academic. Therefore the visit was completed with audio only.  Persons Participating in Visit: Patient.  AWV Questionnaire: No: Patient Medicare AWV questionnaire was not completed prior to this visit.  Cardiac Risk Factors include: advanced age (>19men, >43 women);diabetes mellitus;dyslipidemia     Objective:    Today's Vitals   11/15/23 1133  Weight: 129 lb (58.5 kg)  Height: 5' 1 (1.549 m)   Body mass index is 24.37 kg/m.     11/15/2023   11:33 AM 11/25/2013    4:42 PM 08/04/2012   11:19 AM 12/04/2011   11:18 AM 11/30/2011   10:16 AM  Advanced Directives  Does Patient Have a Medical Advance Directive? Yes Patient has advance directive, copy not in chart  Patient does not have advance directive;Patient would not like information  Patient does not have advance directive  Patient does not have advance directive   Type of Advance Directive Healthcare Power of [place];Living will       Copy of Healthcare Power of Attorney in Chart? No - copy requested      Pre-existing out of facility DNR order (yellow form or pink MOST form)    No       Data saved with a previous flowsheet row definition    Current Medications (verified) Outpatient Encounter Medications as of 11/15/2023  Medication Sig   Bempedoic Acid  (NEXLETOL ) 180 MG TABS Take 1 tablet (180 mg total) by mouth daily.   celecoxib  (CELEBREX ) 200 MG capsule Take 1 capsule (200 mg total) by mouth daily.   Continuous Glucose Receiver (FREESTYLE LIBRE 3 READER) DEVI Use as directed   Continuous Glucose Sensor (FREESTYLE LIBRE 3 SENSOR) MISC Place 1 sensor on the skin every 14 days. Use to check glucose continuously   dapagliflozin  propanediol (FARXIGA ) 10 MG TABS tablet Take 1 tablet (10 mg total) by mouth daily before breakfast.   estradiol  (ESTRACE  VAGINAL) 0.1 MG/GM vaginal cream Place 1 gram vaginally 2 (two) times a week.   Evolocumab  (REPATHA  SURECLICK) 140 MG/ML SOAJ Inject 140 mg into the skin every 14 (fourteen) days.   ezetimibe  (ZETIA ) 10 MG tablet Take 1 tablet (10 mg total) by mouth daily.   fluticasone  (FLONASE ) 50 MCG/ACT nasal spray Place 2 sprays into both nostrils daily.   glucose blood (FREESTYLE LITE) test strip Test 3 times daily as needed   Lancets (FREESTYLE) lancets Use as instructed   levothyroxine  (SYNTHROID ) 88 MCG tablet Take 1 tablet (88 mcg total) by mouth daily before breakfast.   progesterone  (PROMETRIUM ) 100 MG capsule Take 1 capsule (100 mg total)  by mouth at bedtime.   sertraline  (ZOLOFT ) 100 MG tablet Take 1 tablet (100 mg total) by mouth daily.   tirzepatide  (MOUNJARO ) 7.5 MG/0.5ML Pen Inject 7.5 mg into the skin once a week.   No facility-administered encounter medications on file as of 11/15/2023.    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   Diet-controlled type 2 diabetes mellitus (HCC)    Endometrial cancer (HCC) 10/2011 dx   s/p vag hyst 11/2011   History of chicken pox    Hyperlipidemia    Hypothyroidism    Positive TB test    CXRs ok   Rotator cuff injury    Shingles    Past Surgical History:  Procedure Laterality Date   ABDOMINAL HYSTERECTOMY     CESAREAN SECTION     x's 4   DILATION AND CURETTAGE OF UTERUS     hysteroscopy   FRACTURE SURGERY     nose as a child   OPEN REDUCTION INTERNAL FIXATION (ORIF) DISTAL PHALANX Left 08/05/2012   Procedure: OPEN REDUCTION INTERNAL FIXATION (ORIF) DISTAL PHALANX VERSUS EXTERNAL FIXATION LEFT RING FINGER;  Surgeon: Franky JONELLE Curia, MD;  Location: [facility];  Service: Orthopedics;  Laterality: Left;   ROBOTIC ASSISTED LAP VAGINAL HYSTERECTOMY  12/04/2011   Procedure: ROBOTIC ASSISTED LAPAROSCOPIC VAGINAL HYSTERECTOMY;  Surgeon: Elenore A. Dodie, MD;  Location: [facility];  Service: Gynecology;  Laterality: N/A;   SHOULDER ARTHROSCOPY WITH ROTATOR CUFF REPAIR AND SUBACROMIAL DECOMPRESSION Left 11/27/2013   Procedure: LEFT SHOULDER ARTHROSCOPY, SUBACROMIAL DECOMPRESSION, PARTIAL ACROMIOPLASTY WITH  CORACOACROMIAL RELEASE, DISTAL CLAVICULECTOMY WITH ROTATOR CUFF REPAIR AND EXTENSIVE DEBRIDEMENT;  Surgeon: Lamar DELENA Millman, MD;  Location: [facility];  Service: Orthopedics;  Laterality: Left;   Family History  Problem Relation Age of Onset   Arthritis Mother    Cancer Father 36       prostrate   Hypertension Father    Stroke Father        in setting of sepsis f/ RMSF   Heart disease Brother 45       cardiomyopathy s/p ICD, on transplant list   Heart failure Brother    Heart failure Other    Social History   Socioeconomic History   Marital status: Married    Spouse name: Not on file   Number of children:  Not on file   Years of education: Not on file   Highest education level: Master's degree (e.g., MA, MS, MEng, MEd, MSW, MBA)  Occupational History   Not on file  Tobacco Use   Smoking status: Never   Smokeless tobacco: Never  Substance and Sexual Activity   Alcohol use: Not Currently    Alcohol/week: 1.0 standard drink of alcohol    Types: 1 Cans of beer per week    Comment: occ   Drug use: No   Sexual activity: Yes    Partners: Male    Birth control/protection: Surgical    Comment: hysterectomy  Other Topics Concern   Not on file  Social History Narrative   Married, lives with spouse   MS degree in speech path -    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, prior CEO, 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  Walking the dog for exercise   Social Drivers of Health   Financial Resource Strain: Low Risk  (11/15/2023)   Overall Financial Resource Strain (CARDIA)    Difficulty of Paying Living Expenses: Not hard at all  Food Insecurity: No Food Insecurity (11/15/2023)   Hunger Vital Sign    Worried About Running Out of Food in the Last Year: Never true    Ran Out of Food in the Last Year: Never true  Transportation Needs: No Transportation Needs (11/15/2023)   PRAPARE - Administrator, Civil Service (Medical): No    Lack of Transportation (Non-Medical): No  Physical Activity: Insufficiently Active (11/15/2023)   Exercise Vital Sign    Days of Exercise per Week: 2 days    Minutes of Exercise per Session: 30 min  Stress: No Stress Concern Present (12/17/2022)   [employer] of Occupational Health - Occupational Stress Questionnaire    Feeling of Stress : Only a little  Social Connections: Moderately Isolated (11/15/2023)   Social Connection and Isolation Panel    Frequency of Communication with Friends and Family: More than three times a week    Frequency of Social Gatherings with Friends and Family: More than three times a week    Attends Religious Services: Never     Database administrator or Organizations: No    Attends Engineer, structural: Never    Marital Status: Married    Tobacco Counseling Counseling given: No    Clinical Intake:  Pre-visit preparation completed: Yes  Pain : No/denies pain     BMI - recorded: 24.37 Nutritional Status: BMI of 19-24  Normal Nutritional Risks: None Diabetes: Yes CBG done?: No Did pt. bring in CBG monitor from home?: No  Lab Results  Component Value Date   HGBA1C 6.3 06/20/2023   HGBA1C 6.0 12/18/2022   HGBA1C 6.4 06/08/2022     How often do you need to have someone help you when you read instructions, pamphlets, or other written materials from your doctor or pharmacy?: 1 - Never  Interpreter Needed?: No  Information entered by :: Verdie Saba, CMA   Activities of Daily Living     11/15/2023   11:37 AM  In your present state of health, do you have any difficulty performing the following activities:  Hearing? 0  Vision? 0  Difficulty concentrating or making decisions? 0  Walking or climbing stairs? 0  Dressing or bathing? 0  Doing errands, shopping? 0  Preparing Food and eating ? N  Using the Toilet? N  In the past six months, have you accidently leaked urine? N  Do you have problems with loss of bowel control? N  Managing your Medications? N  Managing your Finances? N  Housekeeping or managing your Housekeeping? N    Patient Care Team: Geofm Glade PARAS, MD as PCP - General (Internal Medicine) Rodgers Barnie RAMAN, CNM (Certified Nurse Midwife) Cleotilde Ronal RAMAN, MD (Obstetrics and Gynecology) Dodie Shadow, MD (Gynecologic Oncology) Donnald Charleston, MD (Gastroenterology) Murrell Drivers, MD (Orthopedic Surgery) Jane Charleston, MD (Orthopedic Surgery) Cleotilde Sewer, OD (Optometry)  I have updated your Care Teams any recent Medical Services you may have received from other providers in the past year.     Assessment:   This is a routine wellness examination for  Cheryl Chan.  Hearing/Vision screen Hearing Screening - Comments:: Denies hearing difficulties   Vision Screening - Comments:: Wears rx glasses - up to date with routine eye exams with Sewer Cleotilde  Goals Addressed               This Visit's Progress     Patient Stated (pt-stated)        Patient stated she plans to eat healthy and staying active       Depression Screen     11/15/2023   11:39 AM 10/03/2023    2:36 PM 06/20/2023    8:15 AM 02/07/2023    2:35 PM 12/18/2022    8:14 AM 06/06/2022    8:36 AM 11/24/2020    8:12 AM  PHQ 2/9 Scores  PHQ - 2 Score 0 0 0 0 0 0 1  PHQ- 9 Score 0    1 0     Fall Risk     11/15/2023   11:37 AM 10/03/2023    2:36 PM 06/20/2023    8:15 AM 12/18/2022    8:14 AM 06/06/2022    8:36 AM  Fall Risk   Falls in the past year? 0 0 0 0 0  Number falls in past yr: 0 0 0 0 0  Injury with Fall? 0 0 0 0 0  Risk for fall due to : No Fall Risks No Fall Risks No Fall Risks No Fall Risks No Fall Risks  Follow up Falls evaluation completed;Falls prevention discussed Falls evaluation completed Falls evaluation completed Falls evaluation completed Falls evaluation completed      Data saved with a previous flowsheet row definition    MEDICARE RISK AT HOME:  Medicare Risk at Home Any stairs in or around the home?: Yes If so, are there any without handrails?: No Home free of loose throw rugs in walkways, pet beds, electrical cords, etc?: Yes Adequate lighting in your home to reduce risk of falls?: Yes Life alert?: No Use of a cane, walker or w/c?: No Grab bars in the bathroom?: No Shower chair or bench in shower?: No Elevated toilet seat or a handicapped toilet?: Yes  TIMED UP AND GO:  Was the test performed?  No  Cognitive Function: 6CIT completed        11/15/2023   11:41 AM  6CIT Screen  What Year? 0 points  What month? 0 points  What time? 0 points  Count back from 20 0 points  Months in reverse 0 points  Repeat phrase 0 points  Total Score  0 points    Immunizations Immunization History  Administered Date(s) Administered   Fluad Quad(high Dose 65+) 03/04/2022, 02/15/2023   Influenza Split 02/19/2011, 03/24/2012   Influenza,inj,Quad PF,6+ Mos 02/10/2013, 03/31/2014, 01/25/2015, 03/23/2016, 03/25/2017, 02/07/2018   Influenza-Unspecified 02/19/2019, 02/19/2020   PFIZER(Purple Top)SARS-COV-2 Vaccination 05/31/2019, 06/21/2019, 02/08/2020, 10/07/2020   Pfizer Covid-19 Vaccine Bivalent Booster 97yrs & up 02/13/2021, 03/04/2022   Pfizer(Comirnaty )Fall Seasonal Vaccine 12 years and older 03/09/2022   Pneumococcal Polysaccharide-23 03/30/2013   Tdap 05/21/2006, 08/03/2012   Zoster Recombinant(Shingrix) 09/26/2016, 01/10/2017    Screening Tests Health Maintenance  Topic Date Due   COVID-19 Vaccine (7 - 2024-25 season) 01/20/2023   DTaP/Tdap/Td (3 - Td or Tdap) 06/19/2024 (Originally 08/04/2022)   Pneumococcal Vaccine: 50+ Years (2 of 2 - PCV) 06/19/2024 (Originally 03/30/2014)   FOOT EXAM  12/18/2023   HEMOGLOBIN A1C  12/18/2023   INFLUENZA VACCINE  12/20/2023   Diabetic kidney evaluation - eGFR measurement  06/19/2024   Diabetic kidney evaluation - Urine ACR  06/19/2024   OPHTHALMOLOGY EXAM  08/11/2024   DEXA SCAN  10/12/2024   Medicare Annual Wellness (AWV)  11/14/2024   MAMMOGRAM  02/28/2025   Colonoscopy  06/28/2030   Hepatitis C Screening  Completed   Zoster Vaccines- Shingrix  Completed   Hepatitis B Vaccines  Aged Out   HPV VACCINES  Aged Out   Meningococcal B Vaccine  Aged Out    Health Maintenance  Health Maintenance Due  Topic Date Due   COVID-19 Vaccine (7 - 2024-25 season) 01/20/2023   Health Maintenance Items Addressed:   Additional Screening:  Vision Screening: Recommended annual ophthalmology exams for early detection of glaucoma and other disorders of the eye. Would you like a referral to an eye doctor? No    Dental Screening: Recommended annual dental exams for proper oral  hygiene  Community Resource Referral / Chronic Care Management: CRR required this visit?  No   CCM required this visit?  No   Plan:    I have personally reviewed and noted the following in the patient's chart:   Medical and social history Use of alcohol, tobacco or illicit drugs  Current medications and supplements including opioid prescriptions. Patient is not currently taking opioid prescriptions. Functional ability and status Nutritional status Physical activity Advanced directives List of other physicians Hospitalizations, surgeries, and ER visits in previous 12 months Vitals Screenings to include cognitive, depression, and falls Referrals and appointments  In addition, I have reviewed and discussed with patient certain preventive protocols, quality metrics, and best practice recommendations. A written personalized care plan for preventive services as well as general preventive health recommendations were provided to patient.   Verdie CHRISTELLA Saba, CMA   11/15/2023   After Visit Summary: (MyChart) Due to this being a telephonic visit, the after visit summary with patients personalized plan was offered to patient via MyChart   Notes: Nothing significant to report at this time.

## 2023-11-15 NOTE — Patient Instructions (Signed)
 Cheryl Chan , Thank you for taking time out of your busy schedule to complete your Annual Wellness Visit with me. I enjoyed our conversation and look forward to speaking with you again next year. I, as well as your care team,  appreciate your ongoing commitment to your health goals. Please review the following plan we discussed and let me know if I can assist you in the future. Your Game plan/ To Do List    Follow up Visits: Next Medicare AWV with our clinical staff: 11/17/2024   Have you seen your provider in the last 6 months (3 months if uncontrolled diabetes)? Yes Next Office Visit with your provider: 12/18/2023  Clinician Recommendations:  Aim for 30 minutes of exercise or brisk walking, 6-8 glasses of water , and 5 servings of fruits and vegetables each day.       This is a list of the screening recommended for you and due dates:  Health Maintenance  Topic Date Due   COVID-19 Vaccine (7 - 2024-25 season) 01/20/2023   DTaP/Tdap/Td vaccine (3 - Td or Tdap) 06/19/2024*   Pneumococcal Vaccine for age over 80 (2 of 2 - PCV) 06/19/2024*   Complete foot exam   12/18/2023   Hemoglobin A1C  12/18/2023   Flu Shot  12/20/2023   Yearly kidney function blood test for diabetes  06/19/2024   Yearly kidney health urinalysis for diabetes  06/19/2024   Eye exam for diabetics  08/11/2024   DEXA scan (bone density measurement)  10/12/2024   Medicare Annual Wellness Visit  11/14/2024   Mammogram  02/28/2025   Colon Cancer Screening  06/28/2030   Hepatitis C Screening  Completed   Zoster (Shingles) Vaccine  Completed   Hepatitis B Vaccine  Aged Out   HPV Vaccine  Aged Out   Meningitis B Vaccine  Aged Out  *Topic was postponed. The date shown is not the original due date.    Advanced directives: (Copy Requested) Please bring a copy of your health care power of attorney and living will to the office to be added to your chart at your convenience. You can mail to [facility] [address]  [address] [place], [place] [address] or email to [email or web address] Advance Care Planning is important because it:  [x]  Makes sure you receive the medical care that is consistent with your values, goals, and preferences  [x]  It provides guidance to your family and loved ones and reduces their decisional burden about whether or not they are making the right decisions based on your wishes.  Follow the link provided in your after visit summary or read over the paperwork we have mailed to you to help you started getting your Advance Directives in place. If you need assistance in completing these, please reach out to us  so that we can help you!

## 2023-11-22 ENCOUNTER — Other Ambulatory Visit (HOSPITAL_BASED_OUTPATIENT_CLINIC_OR_DEPARTMENT_OTHER): Payer: Self-pay | Admitting: Internal Medicine

## 2023-11-25 ENCOUNTER — Other Ambulatory Visit: Payer: Self-pay

## 2023-11-25 ENCOUNTER — Other Ambulatory Visit (HOSPITAL_COMMUNITY): Payer: Self-pay

## 2023-11-25 MED ORDER — EZETIMIBE 10 MG PO TABS
10.0000 mg | ORAL_TABLET | Freq: Every day | ORAL | 2 refills | Status: AC
  Filled 2023-11-25: qty 90, 90d supply, fill #0
  Filled 2024-02-29: qty 90, 90d supply, fill #1

## 2023-12-03 DIAGNOSIS — H25013 Cortical age-related cataract, bilateral: Secondary | ICD-10-CM | POA: Diagnosis not present

## 2023-12-03 DIAGNOSIS — H25043 Posterior subcapsular polar age-related cataract, bilateral: Secondary | ICD-10-CM | POA: Diagnosis not present

## 2023-12-03 DIAGNOSIS — H18413 Arcus senilis, bilateral: Secondary | ICD-10-CM | POA: Diagnosis not present

## 2023-12-03 DIAGNOSIS — H2513 Age-related nuclear cataract, bilateral: Secondary | ICD-10-CM | POA: Diagnosis not present

## 2023-12-03 DIAGNOSIS — H2512 Age-related nuclear cataract, left eye: Secondary | ICD-10-CM | POA: Diagnosis not present

## 2023-12-17 ENCOUNTER — Encounter: Payer: Self-pay | Admitting: Internal Medicine

## 2023-12-17 NOTE — Progress Notes (Unsigned)
 Subjective:    Patient ID: Cheryl Chan, female    DOB: [birth year], 68 y.o.   MRN: [id number]     HPI Laporsche is here for follow up of her chronic medical problems.  She has a rash on both legs-? Bug bites, poison ivy -  no other concerns.   Bruising easily.   Not exercising now but very active with house work and yard work.   Trying to get her protein in.    Medications and allergies reviewed with patient and updated if appropriate.  Current Outpatient Medications on File Prior to Visit  Medication Sig Dispense Refill   Bempedoic Acid  (NEXLETOL ) 180 MG TABS Take 1 tablet (180 mg total) by mouth daily. 90 tablet 1   celecoxib  (CELEBREX ) 200 MG capsule Take 1 capsule (200 mg total) by mouth daily. 90 capsule 1   Continuous Glucose Receiver (FREESTYLE LIBRE 3 READER) DEVI Use as directed 1 each 0   Continuous Glucose Sensor (FREESTYLE LIBRE 3 SENSOR) MISC Place 1 sensor on the skin every 14 days. Use to check glucose continuously 2 each 5   dapagliflozin  propanediol (FARXIGA ) 10 MG TABS tablet Take 1 tablet (10 mg total) by mouth daily before breakfast. 90 tablet 1   estradiol  (ESTRACE  VAGINAL) 0.1 MG/GM vaginal cream Place 1 gram vaginally 2 (two) times a week. 42.5 g 1   Evolocumab  (REPATHA  SURECLICK) 140 MG/ML SOAJ Inject 140 mg into the skin every 14 (fourteen) days. 6 mL 3   ezetimibe  (ZETIA ) 10 MG tablet Take 1 tablet (10 mg total) by mouth daily. 90 tablet 2   fluticasone  (FLONASE ) 50 MCG/ACT nasal spray Place 2 sprays into both nostrils daily. 48 g 0   glucose blood (FREESTYLE LITE) test strip Test 3 times daily as needed 100 strip 12   Lancets (FREESTYLE) lancets Use as instructed 100 each 12   levothyroxine  (SYNTHROID ) 88 MCG tablet Take 1 tablet (88 mcg total) by mouth daily before breakfast. 90 tablet 3   progesterone  (PROMETRIUM ) 100 MG capsule Take 1 capsule (100 mg total) by mouth at bedtime. 90 capsule 3   sertraline  (ZOLOFT ) 100 MG tablet Take 1 tablet (100 mg  total) by mouth daily. 90 tablet 1   tirzepatide  (MOUNJARO ) 7.5 MG/0.5ML Pen Inject 7.5 mg into the skin once a week. 6 mL 1   No current facility-administered medications on file prior to visit.     Review of Systems  Constitutional:  Negative for fever.  Respiratory:  Negative for cough, shortness of breath and wheezing.   Cardiovascular:  Negative for chest pain, palpitations and leg swelling.  Gastrointestinal:  Negative for constipation and nausea.       Occ gerd  Neurological:  Negative for light-headedness and headaches.  Psychiatric/Behavioral:  Negative for dysphoric mood. The patient is not nervous/anxious.        Objective:   Vitals:   12/18/23 0812  BP: 106/68  Pulse: 66  Temp: 97.9 F (36.6 C)  SpO2: 99%   BP Readings from Last 3 Encounters:  12/18/23 106/68  10/03/23 120/60  07/24/23 (!) 106/56   Wt Readings from Last 3 Encounters:  12/18/23 125 lb (56.7 kg)  11/15/23 129 lb (58.5 kg)  10/03/23 129 lb 3.2 oz (58.6 kg)   Body mass index is 23.62 kg/m.    Physical Exam Constitutional:      General: She is not in acute distress.    Appearance: Normal appearance.  HENT:  Head: Normocephalic and atraumatic.  Eyes:     Conjunctiva/sclera: Conjunctivae normal.  Cardiovascular:     Rate and Rhythm: Normal rate and regular rhythm.     Heart sounds: Normal heart sounds.  Pulmonary:     Effort: Pulmonary effort is normal. No respiratory distress.     Breath sounds: Normal breath sounds. No wheezing.  Musculoskeletal:     Cervical back: Neck supple.     Right lower leg: No edema.     Left lower leg: No edema.  Lymphadenopathy:     Cervical: No cervical adenopathy.  Skin:    General: Skin is warm and dry.     Findings: Rash (b/l lower leg-maculopapular rash-some papules in clusters and others in a linear pattern) present.  Neurological:     Mental Status: She is alert. Mental status is at baseline.  Psychiatric:        Mood and Affect: Mood  normal.        Behavior: Behavior normal.       Diabetic Foot Exam - Simple   Simple Foot Form Diabetic Foot exam was performed with the following findings: Yes 12/18/2023  8:35 AM  Visual Inspection No deformities, no ulcerations, no other skin breakdown bilaterally: Yes Sensation Testing Intact to touch and monofilament testing bilaterally: Yes Pulse Check Posterior Tibialis and Dorsalis pulse intact bilaterally: Yes Comments      Lab Results  Component Value Date   WBC 4.3 06/20/2023   HGB 14.3 06/20/2023   HCT 43.3 06/20/2023   PLT 275.0 06/20/2023   GLUCOSE 102 (H) 06/20/2023   CHOL 141 07/18/2023   TRIG 84 07/18/2023   HDL 72 07/18/2023   LDLDIRECT 195.0 06/12/2019   LDLCALC 53 07/18/2023   ALT 15 06/20/2023   AST 20 06/20/2023   NA 139 06/20/2023   K 4.6 06/20/2023   CL 101 06/20/2023   CREATININE 0.65 06/20/2023   BUN 24 (H) 06/20/2023   CO2 30 06/20/2023   TSH 2.72 06/20/2023   HGBA1C 6.3 06/20/2023   MICROALBUR 0.2 12/15/2019     Assessment & Plan:    See Problem List for Assessment and Plan of chronic medical problems.

## 2023-12-17 NOTE — Patient Instructions (Addendum)
      Blood work was ordered.       Medications changes include :   None    A referral was ordered and someone will call you to schedule an appointment.     Return in about 6 months (around 06/19/2024) for Physical Exam.

## 2023-12-18 ENCOUNTER — Ambulatory Visit (INDEPENDENT_AMBULATORY_CARE_PROVIDER_SITE_OTHER): Payer: PPO | Admitting: Internal Medicine

## 2023-12-18 VITALS — BP 106/68 | HR 66 | Temp 97.9°F | Ht 61.0 in | Wt 125.0 lb

## 2023-12-18 DIAGNOSIS — M85852 Other specified disorders of bone density and structure, left thigh: Secondary | ICD-10-CM | POA: Diagnosis not present

## 2023-12-18 DIAGNOSIS — M150 Primary generalized (osteo)arthritis: Secondary | ICD-10-CM

## 2023-12-18 DIAGNOSIS — E782 Mixed hyperlipidemia: Secondary | ICD-10-CM | POA: Diagnosis not present

## 2023-12-18 DIAGNOSIS — E119 Type 2 diabetes mellitus without complications: Secondary | ICD-10-CM

## 2023-12-18 DIAGNOSIS — E039 Hypothyroidism, unspecified: Secondary | ICD-10-CM

## 2023-12-18 DIAGNOSIS — R21 Rash and other nonspecific skin eruption: Secondary | ICD-10-CM | POA: Diagnosis not present

## 2023-12-18 DIAGNOSIS — F3289 Other specified depressive episodes: Secondary | ICD-10-CM

## 2023-12-18 LAB — LIPID PANEL
Cholesterol: 125 mg/dL (ref 0–200)
HDL: 64.5 mg/dL (ref >39.00)
LDL Cholesterol: 46 mg/dL (ref 0–99)
NonHDL: 60.29
Total CHOL/HDL Ratio: 2
Triglycerides: 71 mg/dL (ref 0.0–149.0)
VLDL: 14.2 mg/dL (ref 0.0–40.0)

## 2023-12-18 LAB — TSH: TSH: 1.61 u[IU]/mL (ref 0.35–5.50)

## 2023-12-18 LAB — HEMOGLOBIN A1C: Hgb A1c MFr Bld: 6.3 % (ref 4.6–6.5)

## 2023-12-18 LAB — COMPREHENSIVE METABOLIC PANEL WITH GFR
ALT: 15 U/L (ref 0–35)
AST: 21 U/L (ref 0–37)
Albumin: 4.5 g/dL (ref 3.5–5.2)
Alkaline Phosphatase: 37 U/L — ABNORMAL LOW (ref 39–117)
BUN: 23 mg/dL (ref 6–23)
CO2: 30 meq/L (ref 19–32)
Calcium: 9.2 mg/dL (ref 8.4–10.5)
Chloride: 102 meq/L (ref 96–112)
Creatinine, Ser: 0.59 mg/dL (ref 0.40–1.20)
GFR: 93.15 mL/min (ref >60.00)
Glucose, Bld: 115 mg/dL — ABNORMAL HIGH (ref 70–99)
Potassium: 4.4 meq/L (ref 3.5–5.1)
Sodium: 141 meq/L (ref 135–145)
Total Bilirubin: 0.7 mg/dL (ref 0.2–1.2)
Total Protein: 7 g/dL (ref 6.0–8.3)

## 2023-12-18 LAB — MICROALBUMIN / CREATININE URINE RATIO
Creatinine,U: 125.2 mg/dL
Microalb Creat Ratio: 7.1 mg/g (ref 0.0–30.0)
Microalb, Ur: 0.9 mg/dL (ref 0.0–1.9)

## 2023-12-18 MED ORDER — TRIAMCINOLONE ACETONIDE 0.1 % EX CREA: 1.0000 | TOPICAL_CREAM | Freq: Two times a day (BID) | CUTANEOUS | Status: AC

## 2023-12-18 NOTE — Assessment & Plan Note (Signed)
 Chronic Topical medication, tylenol   Try tumeric or tart cherry juice Taking celebrex  daily

## 2023-12-18 NOTE — Assessment & Plan Note (Signed)
 Acute Bilateral lower legs Erythematous papular rash-some of the papules appear to be in a line and some in clusters-possible contact dermatitis Has not been putting anything on the rash Can use over-the-counter Benadryl cream, cortisone 10 for calamine lotion Avoid scratching I will put a steroid cream on her medication list so if needed it can be sent in

## 2023-12-18 NOTE — Assessment & Plan Note (Addendum)
 Chronic Regular exercise and healthy diet encouraged Check lipid panel  Continue Repatha  140 mg q. 14 days, Zetia  10 mg daily, nexletol  180 mg daily

## 2023-12-18 NOTE — Assessment & Plan Note (Signed)
 Chronic Controlled, Stable Continue sertraline 100 mg daily

## 2023-12-18 NOTE — Assessment & Plan Note (Signed)
 Chronic DEXA up-to-date Encouraged regular exercise Taking calcium and vitamin D-gets some of her calcium from the multivitamin and the rest from food

## 2023-12-18 NOTE — Assessment & Plan Note (Signed)
 Chronic  Clinically euthyroid Check tsh and will titrate med dose if needed Currently taking levothyroxine 88 mcg daily

## 2023-12-18 NOTE — Assessment & Plan Note (Signed)
 Chronic  Lab Results  Component Value Date   HGBA1C 6.3 06/20/2023   Sugars well controlled Check A1c, urine albumin/cr Continue Mounjaro  7.5 mg weekly, Farxiga  10 mg daily Doing well with Mounjaro -weight is stable.   Regular exercise advised Stressed diabetic diet

## 2023-12-19 ENCOUNTER — Other Ambulatory Visit: Payer: Self-pay | Admitting: Internal Medicine

## 2023-12-19 ENCOUNTER — Other Ambulatory Visit: Payer: Self-pay

## 2023-12-19 ENCOUNTER — Other Ambulatory Visit (HOSPITAL_COMMUNITY): Payer: Self-pay

## 2023-12-19 MED ORDER — CELECOXIB 200 MG PO CAPS
200.0000 mg | ORAL_CAPSULE | Freq: Every day | ORAL | 1 refills | Status: AC
  Filled 2023-12-19: qty 90, 90d supply, fill #0
  Filled 2024-03-31: qty 90, 90d supply, fill #1

## 2023-12-20 ENCOUNTER — Ambulatory Visit: Payer: Self-pay | Admitting: Internal Medicine

## 2023-12-27 ENCOUNTER — Other Ambulatory Visit (HOSPITAL_BASED_OUTPATIENT_CLINIC_OR_DEPARTMENT_OTHER): Payer: Self-pay | Admitting: Internal Medicine

## 2023-12-27 ENCOUNTER — Other Ambulatory Visit (HOSPITAL_COMMUNITY): Payer: Self-pay

## 2023-12-27 MED ORDER — NEXLETOL 180 MG PO TABS
1.0000 | ORAL_TABLET | Freq: Every day | ORAL | 1 refills | Status: AC
  Filled 2023-12-27 – 2024-01-13 (×2): qty 90, 90d supply, fill #0
  Filled 2024-04-07: qty 90, 90d supply, fill #1

## 2024-01-07 ENCOUNTER — Other Ambulatory Visit (HOSPITAL_COMMUNITY): Payer: Self-pay

## 2024-01-08 ENCOUNTER — Other Ambulatory Visit (HOSPITAL_COMMUNITY): Payer: Self-pay

## 2024-01-09 ENCOUNTER — Other Ambulatory Visit (HOSPITAL_COMMUNITY): Payer: Self-pay

## 2024-01-09 ENCOUNTER — Telehealth: Payer: Self-pay | Admitting: Pharmacy Technician

## 2024-01-09 NOTE — Telephone Encounter (Signed)
 Attempted to call [employer] and they claimed that unless I had the provider NPI number, even with the patient information, they could not look into the original call that was made to our office. Looked at 2 separate updated NPI sheets and did not have Cheryl Chan's NPI, explained that to the caller and they continued to ask for member ID information. Explained that I did not have that being we are the providers office. Caller continued to ask for the NPI number or member ID number. Advised caller that we were called for an update on the prior auth and it is still pending and they will need to call us  back for further information.

## 2024-01-09 NOTE — Telephone Encounter (Signed)
 Sonu with [employer] is following up regarding PA request. She is requesting to clarify the quantity for Nexletol . Please advise.  Phone #: [phone] (option#: 2)

## 2024-01-09 NOTE — Telephone Encounter (Signed)
 Pharmacy Patient Advocate Encounter   Received notification from Fax that prior authorization for Nexletol  is required/requested.   Insurance verification completed.   The patient is insured through [facility] [employer]/RX [employer]VANCE .   Per test claim: PA required; PA submitted to above mentioned insurance via Latent Key/confirmation #/EOC [id number] Status is pending

## 2024-01-10 ENCOUNTER — Other Ambulatory Visit (HOSPITAL_COMMUNITY): Payer: Self-pay

## 2024-01-10 NOTE — Telephone Encounter (Signed)
 Pharmacy Patient Advocate Encounter  Received notification from [employer] that Prior Authorization for nexletol  has been APPROVED from 01/10/24 to 01/08/25. Ran test claim, Copay is $0.00- 3 months. This test claim was processed through [facility]- copay amounts may vary at other pharmacies due to pharmacy/plan contracts, or as the patient moves through the different stages of their insurance plan.   PA #/Case ID/Reference #: [id number]

## 2024-01-13 ENCOUNTER — Other Ambulatory Visit (HOSPITAL_COMMUNITY): Payer: Self-pay

## 2024-01-14 ENCOUNTER — Other Ambulatory Visit: Payer: Self-pay

## 2024-01-15 ENCOUNTER — Other Ambulatory Visit: Payer: Self-pay

## 2024-01-15 ENCOUNTER — Other Ambulatory Visit (HOSPITAL_COMMUNITY): Payer: Self-pay

## 2024-01-15 ENCOUNTER — Encounter: Payer: Self-pay | Admitting: Internal Medicine

## 2024-01-15 DIAGNOSIS — E119 Type 2 diabetes mellitus without complications: Secondary | ICD-10-CM

## 2024-01-15 MED ORDER — DIFLUPREDNATE 0.05 % OP EMUL
1.0000 [drp] | Freq: Four times a day (QID) | OPHTHALMIC | 1 refills | Status: DC
  Filled 2024-01-15: qty 5, 25d supply, fill #0

## 2024-01-15 MED ORDER — KETOROLAC TROMETHAMINE 0.5 % OP SOLN
1.0000 [drp] | Freq: Two times a day (BID) | OPHTHALMIC | 1 refills | Status: AC
  Filled 2024-01-15: qty 5, 50d supply, fill #0

## 2024-01-15 MED ORDER — PREDNISOLONE ACETATE 1 % OP SUSP
OPHTHALMIC | 1 refills | Status: AC
  Filled 2024-01-15: qty 5, 25d supply, fill #0

## 2024-01-15 MED ORDER — FREESTYLE LIBRE 3 PLUS SENSOR MISC
4 refills | Status: AC
  Filled 2024-01-15: qty 2, 30d supply, fill #0
  Filled 2024-05-17: qty 2, 30d supply, fill #1

## 2024-01-15 MED ORDER — GATIFLOXACIN 0.5 % OP SOLN
1.0000 [drp] | Freq: Four times a day (QID) | OPHTHALMIC | 1 refills | Status: AC
  Filled 2024-01-15: qty 5, 25d supply, fill #0

## 2024-01-15 NOTE — Telephone Encounter (Signed)
 Patient updated via MyChart

## 2024-01-29 ENCOUNTER — Other Ambulatory Visit: Payer: Self-pay | Admitting: Internal Medicine

## 2024-01-29 DIAGNOSIS — Z1231 Encounter for screening mammogram for malignant neoplasm of breast: Secondary | ICD-10-CM

## 2024-01-30 ENCOUNTER — Other Ambulatory Visit (HOSPITAL_COMMUNITY): Payer: Self-pay

## 2024-01-30 ENCOUNTER — Other Ambulatory Visit: Payer: Self-pay

## 2024-01-30 MED ORDER — DAPAGLIFLOZIN PROPANEDIOL 10 MG PO TABS
10.0000 mg | ORAL_TABLET | Freq: Every day | ORAL | 1 refills | Status: DC
  Filled 2024-01-30: qty 90, 90d supply, fill #0
  Filled 2024-05-17: qty 90, 90d supply, fill #1

## 2024-01-30 MED ORDER — SERTRALINE HCL 100 MG PO TABS
100.0000 mg | ORAL_TABLET | Freq: Every day | ORAL | 1 refills | Status: DC
  Filled 2024-01-30: qty 90, 90d supply, fill #0
  Filled 2024-05-17: qty 90, 90d supply, fill #1

## 2024-02-08 ENCOUNTER — Other Ambulatory Visit (HOSPITAL_BASED_OUTPATIENT_CLINIC_OR_DEPARTMENT_OTHER): Payer: Self-pay

## 2024-02-10 ENCOUNTER — Ambulatory Visit (HOSPITAL_BASED_OUTPATIENT_CLINIC_OR_DEPARTMENT_OTHER): Admitting: Obstetrics & Gynecology

## 2024-02-10 DIAGNOSIS — H52209 Unspecified astigmatism, unspecified eye: Secondary | ICD-10-CM | POA: Diagnosis not present

## 2024-02-10 DIAGNOSIS — H2511 Age-related nuclear cataract, right eye: Secondary | ICD-10-CM | POA: Diagnosis not present

## 2024-02-11 ENCOUNTER — Other Ambulatory Visit: Payer: Self-pay | Admitting: Internal Medicine

## 2024-02-11 ENCOUNTER — Other Ambulatory Visit: Payer: Self-pay

## 2024-02-11 ENCOUNTER — Other Ambulatory Visit (HOSPITAL_COMMUNITY): Payer: Self-pay

## 2024-02-11 DIAGNOSIS — H2512 Age-related nuclear cataract, left eye: Secondary | ICD-10-CM | POA: Diagnosis not present

## 2024-02-11 MED ORDER — GATIFLOXACIN 0.5 % OP SOLN
1.0000 [drp] | Freq: Four times a day (QID) | OPHTHALMIC | 1 refills | Status: AC
  Filled 2024-02-11: qty 5, 25d supply, fill #0
  Filled 2024-03-08: qty 5, 25d supply, fill #1

## 2024-02-11 MED ORDER — KETOROLAC TROMETHAMINE 0.5 % OP SOLN
1.0000 [drp] | Freq: Two times a day (BID) | OPHTHALMIC | 1 refills | Status: AC
  Filled 2024-02-11 – 2024-02-21 (×2): qty 5, 50d supply, fill #0
  Filled 2024-03-08: qty 5, 50d supply, fill #1

## 2024-02-11 MED ORDER — PREDNISOLONE ACETATE 1 % OP SUSP
1.0000 [drp] | Freq: Four times a day (QID) | OPHTHALMIC | 1 refills | Status: AC
  Filled 2024-02-11: qty 5, 25d supply, fill #0
  Filled 2024-03-08: qty 5, 25d supply, fill #1

## 2024-02-12 ENCOUNTER — Other Ambulatory Visit (HOSPITAL_COMMUNITY): Payer: Self-pay

## 2024-02-12 MED ORDER — MOUNJARO 7.5 MG/0.5ML ~~LOC~~ SOAJ
7.5000 mg | SUBCUTANEOUS | 1 refills | Status: AC
  Filled 2024-02-12: qty 6, 84d supply, fill #0
  Filled 2024-05-17: qty 6, 84d supply, fill #1

## 2024-02-15 ENCOUNTER — Other Ambulatory Visit (HOSPITAL_COMMUNITY): Payer: Self-pay

## 2024-02-18 ENCOUNTER — Other Ambulatory Visit: Payer: Self-pay

## 2024-02-18 ENCOUNTER — Other Ambulatory Visit (HOSPITAL_COMMUNITY): Payer: Self-pay

## 2024-02-18 ENCOUNTER — Telehealth: Payer: Self-pay

## 2024-02-18 NOTE — Telephone Encounter (Signed)
 Pharmacy Patient Advocate Encounter   Received notification from Onbase that prior authorization for Mounjaro  7.5MG /0.5ML auto-injectors  is required/requested.   Insurance verification completed.   The patient is insured through [facility] [employer]ANT[employer]/RX ADVANCE .   Per test claim: PA required; PA submitted to above mentioned insurance via Latent Key/confirmation #/EOC [id number]   Status is pending

## 2024-02-20 ENCOUNTER — Other Ambulatory Visit (HOSPITAL_COMMUNITY): Payer: Self-pay

## 2024-02-20 NOTE — Telephone Encounter (Signed)
 Pharmacy Patient Advocate Encounter  Received notification from [facility] [employer]RX [employer] that Prior Authorization for Mounjaro  7.5MG /0.5ML auto-injectors  has been APPROVED from 9.30.25 to 9.30.26. Ran test claim, Copay is $RTS, RX LAST FILLED ON 9.30.25. This test claim was processed through [facility]- copay amounts may vary at other pharmacies due to pharmacy/plan contracts, or as the patient moves through the different stages of their insurance plan.   PA #/Case ID/Reference #: [id number]

## 2024-02-21 ENCOUNTER — Other Ambulatory Visit (HOSPITAL_COMMUNITY): Payer: Self-pay

## 2024-02-21 ENCOUNTER — Other Ambulatory Visit: Payer: Self-pay

## 2024-02-24 DIAGNOSIS — H25042 Posterior subcapsular polar age-related cataract, left eye: Secondary | ICD-10-CM | POA: Diagnosis not present

## 2024-02-24 DIAGNOSIS — H2512 Age-related nuclear cataract, left eye: Secondary | ICD-10-CM | POA: Diagnosis not present

## 2024-02-24 DIAGNOSIS — H52209 Unspecified astigmatism, unspecified eye: Secondary | ICD-10-CM | POA: Diagnosis not present

## 2024-02-24 DIAGNOSIS — H25012 Cortical age-related cataract, left eye: Secondary | ICD-10-CM | POA: Diagnosis not present

## 2024-02-29 ENCOUNTER — Other Ambulatory Visit (HOSPITAL_COMMUNITY): Payer: Self-pay

## 2024-03-03 ENCOUNTER — Other Ambulatory Visit: Payer: Self-pay

## 2024-03-03 ENCOUNTER — Ambulatory Visit
Admission: RE | Admit: 2024-03-03 | Discharge: 2024-03-03 | Disposition: A | Discharge status: Home / Self Care | Source: Ambulatory Visit | Attending: Internal Medicine | Admitting: Internal Medicine

## 2024-03-03 DIAGNOSIS — Z1231 Encounter for screening mammogram for malignant neoplasm of breast: Secondary | ICD-10-CM | POA: Diagnosis not present

## 2024-03-09 ENCOUNTER — Other Ambulatory Visit: Payer: Self-pay

## 2024-03-09 ENCOUNTER — Other Ambulatory Visit (HOSPITAL_COMMUNITY): Payer: Self-pay

## 2024-03-31 DIAGNOSIS — D225 Melanocytic nevi of trunk: Secondary | ICD-10-CM | POA: Diagnosis not present

## 2024-03-31 DIAGNOSIS — D485 Neoplasm of uncertain behavior of skin: Secondary | ICD-10-CM | POA: Diagnosis not present

## 2024-03-31 DIAGNOSIS — L821 Other seborrheic keratosis: Secondary | ICD-10-CM | POA: Diagnosis not present

## 2024-03-31 DIAGNOSIS — D1801 Hemangioma of skin and subcutaneous tissue: Secondary | ICD-10-CM | POA: Diagnosis not present

## 2024-03-31 DIAGNOSIS — L738 Other specified follicular disorders: Secondary | ICD-10-CM | POA: Diagnosis not present

## 2024-04-01 ENCOUNTER — Other Ambulatory Visit (HOSPITAL_BASED_OUTPATIENT_CLINIC_OR_DEPARTMENT_OTHER): Payer: Self-pay

## 2024-04-07 ENCOUNTER — Other Ambulatory Visit: Payer: Self-pay

## 2024-04-09 DIAGNOSIS — L988 Other specified disorders of the skin and subcutaneous tissue: Secondary | ICD-10-CM | POA: Diagnosis not present

## 2024-04-09 DIAGNOSIS — D485 Neoplasm of uncertain behavior of skin: Secondary | ICD-10-CM | POA: Diagnosis not present

## 2024-04-09 DIAGNOSIS — L820 Inflamed seborrheic keratosis: Secondary | ICD-10-CM | POA: Diagnosis not present

## 2024-04-22 ENCOUNTER — Encounter (HOSPITAL_BASED_OUTPATIENT_CLINIC_OR_DEPARTMENT_OTHER): Payer: Self-pay | Admitting: Obstetrics & Gynecology

## 2024-04-22 ENCOUNTER — Telehealth: Admitting: Family Medicine

## 2024-04-22 ENCOUNTER — Other Ambulatory Visit (HOSPITAL_COMMUNITY): Payer: Self-pay

## 2024-04-22 ENCOUNTER — Ambulatory Visit (HOSPITAL_BASED_OUTPATIENT_CLINIC_OR_DEPARTMENT_OTHER): Admitting: Obstetrics & Gynecology

## 2024-04-22 VITALS — BP 128/73 | HR 78 | Ht 62.0 in | Wt 132.0 lb

## 2024-04-22 DIAGNOSIS — F5101 Primary insomnia: Secondary | ICD-10-CM

## 2024-04-22 DIAGNOSIS — M85852 Other specified disorders of bone density and structure, left thigh: Secondary | ICD-10-CM | POA: Diagnosis not present

## 2024-04-22 DIAGNOSIS — N952 Postmenopausal atrophic vaginitis: Secondary | ICD-10-CM

## 2024-04-22 DIAGNOSIS — Z90711 Acquired absence of uterus with remaining cervical stump: Secondary | ICD-10-CM | POA: Diagnosis not present

## 2024-04-22 DIAGNOSIS — J069 Acute upper respiratory infection, unspecified: Secondary | ICD-10-CM | POA: Diagnosis not present

## 2024-04-22 DIAGNOSIS — Z9189 Other specified personal risk factors, not elsewhere classified: Secondary | ICD-10-CM

## 2024-04-22 DIAGNOSIS — Z1331 Encounter for screening for depression: Secondary | ICD-10-CM

## 2024-04-22 DIAGNOSIS — Z8542 Personal history of malignant neoplasm of other parts of uterus: Secondary | ICD-10-CM

## 2024-04-22 DIAGNOSIS — Z01419 Encounter for gynecological examination (general) (routine) without abnormal findings: Secondary | ICD-10-CM

## 2024-04-22 MED ORDER — PROGESTERONE MICRONIZED 100 MG PO CAPS
100.0000 mg | ORAL_CAPSULE | Freq: Every day | ORAL | 3 refills | Status: AC
  Filled 2024-04-22: qty 90, 90d supply, fill #0

## 2024-04-22 MED ORDER — ESTRADIOL 0.01 % VA CREA
1.0000 g | TOPICAL_CREAM | VAGINAL | 2 refills | Status: AC
  Filled 2024-04-22: qty 42.5, 30d supply, fill #0

## 2024-04-22 MED ORDER — AZELASTINE HCL 0.1 % NA SOLN: 2.0000 | Freq: Two times a day (BID) | NASAL | 0 refills | Status: AC

## 2024-04-22 NOTE — Progress Notes (Signed)

## 2024-04-22 NOTE — Progress Notes (Signed)
 Breast and Pelvic Exam Patient name: Cheryl Chan MRN [id number]  Date of birth: [birth year] Chief Complaint:   Gynecologic Exam  History of Present Illness:   Cheryl Chan is a 68 y.o. [id number] Caucasian female being seen today for breast and pelvic exam.  H/o endometrial cancer s/p hysterectomy in 2013.  Denies vaginal bleeding.    Going to [place]  for Christmas to see her son.  Father passed in September.  He was 89.  Now she is helping with her mother a lot who is still trying to live independently.     Patient's last menstrual period was 05/22/2003.  Last pap 02/07/2023. Results were: NILM w/ HRHPV negative. H/O abnormal pap: yes Last mammogram: 03/03/2024. Results were: normal. Family h/o breast cancer: no Last colonoscopy: 07/26/2016. Results were: normal. Family h/o colorectal cancer: no DEXA:  2023.  T score -1.7     11/15/2023   11:39 AM 10/03/2023    2:36 PM 06/20/2023    8:15 AM 02/07/2023    2:35 PM 12/18/2022    8:14 AM  Depression screen PHQ 2/9  Decreased Interest 0 0 0 0 0  Down, Depressed, Hopeless 0 0 0 0 0  PHQ - 2 Score 0 0 0 0 0  Altered sleeping 0    0  Tired, decreased energy 0    1  Change in appetite 0    0  Feeling bad or failure about yourself  0    0  Trouble concentrating 0    0  Moving slowly or fidgety/restless 0    0  Suicidal thoughts 0    0  PHQ-9 Score 0     1   Difficult doing work/chores Not difficult at all    Not difficult at all     Data saved with a previous flowsheet row definition        10/25/2020   12:51 PM  GAD 7 : Generalized Anxiety Score  Nervous, Anxious, on Edge 1  Control/stop worrying 0  Worry too much - different things 0  Trouble relaxing 1  Restless 0  Easily annoyed or irritable 0  Afraid - awful might happen 0  Total GAD 7 Score 2  Anxiety Difficulty Not difficult at all    Review of Systems:   Pertinent items are noted in HPI Denies any urinary or bowel issues.  Denies pelvic pain.   Pertinent History  Reviewed:  Reviewed past medical,surgical, social and family history.  Reviewed problem list, medications and allergies. Physical Assessment:   Vitals:   04/22/24 1343  BP: 128/73  Pulse: 78  SpO2: 100%  Weight: 132 lb (59.9 kg)  Height: 5' 2 (1.575 m)  Body mass index is 24.14 kg/m.        Physical Examination:   General appearance - well appearing, and in no distress  Mental status - alert, oriented to person, place, and time  Psych:  She has a normal mood and affect  Skin - warm and dry, normal color, no suspicious lesions noted  Chest - effort normal, all lung fields clear to auscultation bilaterally  Heart - normal rate and regular rhythm  Neck:  midline trachea, no thyromegaly or nodules  Breasts - breasts appear normal, no suspicious masses, no skin or nipple changes or  axillary nodes  Abdomen - soft, nontender, nondistended, no masses or organomegaly  Pelvic - VULVA: normal appearing vulva with no masses, tenderness or lesion   VAGINA: atrophic changes, no lesions  CERVIX: surgically absent  Thin prep pap is   UTERUS: surgically absent  ADNEXA: No adnexal masses or tenderness noted.  Rectal - normal rectal, good sphincter tone, no masses felt.   Extremities:  No swelling or varicosities noted  Chaperone present for exam  No results found for this or any previous visit (from the past 24 hours).  Assessment & Plan:  1. GYN exam for high-risk Medicare patient (Primary) - Pap smear not indicated - Mammogram 10/11/2020 - Colonoscopy 2018 - Bone mineral density 2023 - lab work done with PCP, Dr. Geofm - vaccines reviewed/updated  2. History of uterine cancer - normal exam today  3. Osteopenia of neck of left femur  4. Primary insomnia - progesterone  (PROMETRIUM ) 100 MG capsule; Take 1 capsule (100 mg total) by mouth at bedtime.  Dispense: 90 capsule; Refill: 3  5. Vaginal atrophy - RF for estradiol  1 gram pv twice weekly.  # 42.5grams with RFs.      No  orders of the defined types were placed in this encounter.   Meds:  Meds ordered this encounter  Medications   progesterone  (PROMETRIUM ) 100 MG capsule    Sig: Take 1 capsule (100 mg total) by mouth at bedtime.    Dispense:  90 capsule    Refill:  3   estradiol  (ESTRACE ) 0.01 % CREA vaginal cream    Sig: 1 gram vaginally twice weekly    Dispense:  42.5 g    Refill:  2    Follow-up: Return in about 1 year (around 04/22/2025).  Ronal GORMAN Pinal, MD 04/22/2024 2:23 PM

## 2024-04-23 ENCOUNTER — Other Ambulatory Visit (HOSPITAL_COMMUNITY): Payer: Self-pay

## 2024-05-17 ENCOUNTER — Other Ambulatory Visit (HOSPITAL_COMMUNITY): Payer: Self-pay

## 2024-05-18 ENCOUNTER — Other Ambulatory Visit: Payer: Self-pay

## 2024-05-19 ENCOUNTER — Other Ambulatory Visit: Payer: Self-pay

## 2024-05-19 ENCOUNTER — Other Ambulatory Visit: Payer: Self-pay | Admitting: Internal Medicine

## 2024-05-19 ENCOUNTER — Other Ambulatory Visit (HOSPITAL_COMMUNITY): Payer: Self-pay

## 2024-05-19 MED ORDER — SERTRALINE HCL 100 MG PO TABS
100.0000 mg | ORAL_TABLET | Freq: Every day | ORAL | 1 refills | Status: AC
  Filled 2024-05-19: qty 90, 90d supply, fill #0

## 2024-05-19 MED ORDER — DAPAGLIFLOZIN PROPANEDIOL 10 MG PO TABS
10.0000 mg | ORAL_TABLET | Freq: Every day | ORAL | 1 refills | Status: AC
  Filled 2024-05-19: qty 90, 90d supply, fill #0

## 2024-05-20 ENCOUNTER — Other Ambulatory Visit (HOSPITAL_COMMUNITY): Payer: Self-pay

## 2024-05-20 ENCOUNTER — Other Ambulatory Visit: Payer: Self-pay

## 2024-05-20 MED ORDER — PREDNISOLONE ACETATE 1 % OP SUSP
1.0000 [drp] | Freq: Four times a day (QID) | OPHTHALMIC | 0 refills | Status: AC
  Filled 2024-05-20: qty 5, 25d supply, fill #0

## 2024-06-21 ENCOUNTER — Telehealth: Admitting: Family

## 2024-06-21 DIAGNOSIS — J208 Acute bronchitis due to other specified organisms: Secondary | ICD-10-CM

## 2024-06-21 DIAGNOSIS — B9689 Other specified bacterial agents as the cause of diseases classified elsewhere: Secondary | ICD-10-CM

## 2024-06-21 MED ORDER — PROMETHAZINE-DM 6.25-15 MG/5ML PO SYRP: 5.0000 mL | ORAL_SOLUTION | Freq: Three times a day (TID) | ORAL | 0 refills | Status: AC | PRN

## 2024-06-21 MED ORDER — BENZONATATE 200 MG PO CAPS: 200.0000 mg | ORAL_CAPSULE | Freq: Two times a day (BID) | ORAL | 0 refills | Status: AC | PRN

## 2024-06-21 MED ORDER — AZITHROMYCIN 250 MG PO TABS: ORAL_TABLET | ORAL | 0 refills | Status: AC

## 2024-06-29 ENCOUNTER — Encounter: Admitting: Internal Medicine

## 2024-08-19 ENCOUNTER — Encounter (HOSPITAL_BASED_OUTPATIENT_CLINIC_OR_DEPARTMENT_OTHER): Admitting: Nurse Practitioner

## 2024-11-17 ENCOUNTER — Ambulatory Visit
